# Patient Record
Sex: Male | Born: 1935 | Race: White | Hispanic: No | Marital: Married | Smoking: Former smoker
Health system: Southern US, Community
[De-identification: ages and names within clinical notes are randomized; demographics above are authoritative.]

## PROBLEM LIST (undated history)

## (undated) DIAGNOSIS — Z9889 Other specified postprocedural states: Secondary | ICD-10-CM

## (undated) DIAGNOSIS — N183 Chronic kidney disease, stage 3 unspecified: Secondary | ICD-10-CM

## (undated) DIAGNOSIS — I10 Essential (primary) hypertension: Secondary | ICD-10-CM

## (undated) DIAGNOSIS — K219 Gastro-esophageal reflux disease without esophagitis: Secondary | ICD-10-CM

## (undated) DIAGNOSIS — Z950 Presence of cardiac pacemaker: Secondary | ICD-10-CM

## (undated) DIAGNOSIS — I480 Paroxysmal atrial fibrillation: Secondary | ICD-10-CM

## (undated) DIAGNOSIS — Z87442 Personal history of urinary calculi: Secondary | ICD-10-CM

## (undated) DIAGNOSIS — R112 Nausea with vomiting, unspecified: Secondary | ICD-10-CM

## (undated) DIAGNOSIS — Z8719 Personal history of other diseases of the digestive system: Secondary | ICD-10-CM

## (undated) DIAGNOSIS — Z953 Presence of xenogenic heart valve: Secondary | ICD-10-CM

## (undated) DIAGNOSIS — E785 Hyperlipidemia, unspecified: Secondary | ICD-10-CM

## (undated) DIAGNOSIS — I2510 Atherosclerotic heart disease of native coronary artery without angina pectoris: Secondary | ICD-10-CM

## (undated) DIAGNOSIS — I471 Supraventricular tachycardia, unspecified: Secondary | ICD-10-CM

## (undated) DIAGNOSIS — D519 Vitamin B12 deficiency anemia, unspecified: Secondary | ICD-10-CM

## (undated) DIAGNOSIS — N400 Enlarged prostate without lower urinary tract symptoms: Secondary | ICD-10-CM

## (undated) DIAGNOSIS — G629 Polyneuropathy, unspecified: Secondary | ICD-10-CM

## (undated) DIAGNOSIS — C679 Malignant neoplasm of bladder, unspecified: Secondary | ICD-10-CM

## (undated) DIAGNOSIS — I340 Nonrheumatic mitral (valve) insufficiency: Secondary | ICD-10-CM

## (undated) HISTORY — PX: CATARACT EXTRACTION, BILATERAL: SHX1313

## (undated) HISTORY — DX: Essential (primary) hypertension: I10

## (undated) HISTORY — PX: AORTIC VALVE REPLACEMENT: SHX41

## (undated) HISTORY — DX: Vitamin B12 deficiency anemia, unspecified: D51.9

## (undated) HISTORY — PX: PREPATELLAR BURSA EXCISION: SUR547

## (undated) HISTORY — DX: Malignant neoplasm of bladder, unspecified: C67.9

## (undated) HISTORY — PX: OTHER SURGICAL HISTORY: SHX169

## (undated) HISTORY — DX: Gastro-esophageal reflux disease without esophagitis: K21.9

## (undated) HISTORY — PX: PACEMAKER INSERTION: SHX728

---
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
## 1974-12-16 HISTORY — PX: HAND SURGERY: SHX662

## 1989-12-16 HISTORY — PX: BACK SURGERY: SHX140

## 1993-12-16 HISTORY — PX: ROTATOR CUFF REPAIR: SHX139

## 2003-05-05 ENCOUNTER — Encounter: Payer: Self-pay | Admitting: Orthopedic Surgery

## 2003-05-10 ENCOUNTER — Inpatient Hospital Stay (HOSPITAL_COMMUNITY): Admission: RE | Admit: 2003-05-10 | Discharge: 2003-05-11 | Payer: Self-pay | Admitting: Orthopedic Surgery

## 2008-05-30 ENCOUNTER — Ambulatory Visit: Payer: Self-pay | Admitting: Urology

## 2008-12-16 HISTORY — PX: TURP VAPORIZATION: SUR1397

## 2009-12-06 ENCOUNTER — Ambulatory Visit: Payer: Self-pay | Admitting: Internal Medicine

## 2009-12-12 ENCOUNTER — Inpatient Hospital Stay: Payer: Self-pay | Admitting: Cardiovascular Disease

## 2011-04-18 ENCOUNTER — Ambulatory Visit: Payer: Self-pay | Admitting: Ophthalmology

## 2011-04-24 ENCOUNTER — Ambulatory Visit: Payer: Self-pay | Admitting: Ophthalmology

## 2011-09-25 ENCOUNTER — Ambulatory Visit: Payer: Self-pay | Admitting: Ophthalmology

## 2012-03-03 ENCOUNTER — Ambulatory Visit: Payer: Self-pay | Admitting: Family

## 2012-03-03 LAB — CBC WITH DIFFERENTIAL/PLATELET
Basophil %: 0.4 %
Eosinophil #: 0.5 10*3/uL (ref 0.0–0.7)
Eosinophil %: 7 %
HGB: 15.1 g/dL (ref 13.0–18.0)
Lymphocyte %: 24.2 %
MCHC: 33.8 g/dL (ref 32.0–36.0)
Monocyte %: 8.8 %
Neutrophil #: 3.9 10*3/uL (ref 1.4–6.5)
Neutrophil %: 59.6 %
Platelet: 187 10*3/uL (ref 150–440)
RBC: 4.84 10*6/uL (ref 4.40–5.90)

## 2012-03-03 LAB — BASIC METABOLIC PANEL
BUN: 23 mg/dL — ABNORMAL HIGH (ref 7–18)
Chloride: 104 mmol/L (ref 98–107)
Co2: 26 mmol/L (ref 21–32)
Creatinine: 1.34 mg/dL — ABNORMAL HIGH (ref 0.60–1.30)
EGFR (Non-African Amer.): 54 — ABNORMAL LOW
Glucose: 93 mg/dL (ref 65–99)
Osmolality: 287 (ref 275–301)

## 2012-03-03 LAB — TROPONIN I: Troponin-I: 0.02 ng/mL

## 2012-03-03 LAB — CK: CK, Total: 156 U/L (ref 35–232)

## 2012-03-03 LAB — PRO B NATRIURETIC PEPTIDE: B-Type Natriuretic Peptide: 475 pg/mL — ABNORMAL HIGH (ref 0–450)

## 2012-03-19 ENCOUNTER — Ambulatory Visit: Payer: Self-pay | Admitting: Cardiovascular Disease

## 2012-08-14 ENCOUNTER — Ambulatory Visit: Payer: Self-pay | Admitting: Cardiovascular Disease

## 2012-08-16 HISTORY — PX: CORONARY ARTERY BYPASS GRAFT: SHX141

## 2012-08-20 DIAGNOSIS — N400 Enlarged prostate without lower urinary tract symptoms: Secondary | ICD-10-CM | POA: Insufficient documentation

## 2012-08-20 DIAGNOSIS — E785 Hyperlipidemia, unspecified: Secondary | ICD-10-CM | POA: Insufficient documentation

## 2012-08-20 DIAGNOSIS — I471 Supraventricular tachycardia: Secondary | ICD-10-CM | POA: Insufficient documentation

## 2012-08-20 DIAGNOSIS — I10 Essential (primary) hypertension: Secondary | ICD-10-CM | POA: Insufficient documentation

## 2012-08-20 DIAGNOSIS — I2510 Atherosclerotic heart disease of native coronary artery without angina pectoris: Secondary | ICD-10-CM | POA: Insufficient documentation

## 2012-08-21 DIAGNOSIS — Z952 Presence of prosthetic heart valve: Secondary | ICD-10-CM | POA: Insufficient documentation

## 2012-08-21 DIAGNOSIS — K219 Gastro-esophageal reflux disease without esophagitis: Secondary | ICD-10-CM | POA: Insufficient documentation

## 2012-08-21 DIAGNOSIS — I350 Nonrheumatic aortic (valve) stenosis: Secondary | ICD-10-CM | POA: Insufficient documentation

## 2012-08-21 DIAGNOSIS — I340 Nonrheumatic mitral (valve) insufficiency: Secondary | ICD-10-CM | POA: Insufficient documentation

## 2012-08-27 DIAGNOSIS — I4891 Unspecified atrial fibrillation: Secondary | ICD-10-CM | POA: Insufficient documentation

## 2012-08-27 DIAGNOSIS — Z950 Presence of cardiac pacemaker: Secondary | ICD-10-CM | POA: Insufficient documentation

## 2012-08-30 DIAGNOSIS — I272 Pulmonary hypertension, unspecified: Secondary | ICD-10-CM | POA: Insufficient documentation

## 2012-09-08 DIAGNOSIS — I480 Paroxysmal atrial fibrillation: Secondary | ICD-10-CM | POA: Insufficient documentation

## 2012-09-12 ENCOUNTER — Other Ambulatory Visit: Payer: Self-pay | Admitting: Cardiothoracic Surgery

## 2012-09-12 LAB — BASIC METABOLIC PANEL
BUN: 37 mg/dL — ABNORMAL HIGH (ref 7–18)
Calcium, Total: 8.4 mg/dL — ABNORMAL LOW (ref 8.5–10.1)
Chloride: 95 mmol/L — ABNORMAL LOW (ref 98–107)
EGFR (African American): 28 — ABNORMAL LOW
EGFR (Non-African Amer.): 24 — ABNORMAL LOW
Glucose: 93 mg/dL (ref 65–99)
Potassium: 4.4 mmol/L (ref 3.5–5.1)
Sodium: 134 mmol/L — ABNORMAL LOW (ref 136–145)

## 2012-09-12 LAB — PROTIME-INR: Prothrombin Time: 18.6 secs — ABNORMAL HIGH (ref 11.5–14.7)

## 2012-11-17 ENCOUNTER — Ambulatory Visit: Payer: Self-pay | Admitting: Urology

## 2012-11-17 LAB — CBC WITH DIFFERENTIAL/PLATELET
Basophil %: 0.6 %
Eosinophil %: 1.4 %
HCT: 26.7 % — ABNORMAL LOW (ref 40.0–52.0)
HGB: 8.9 g/dL — ABNORMAL LOW (ref 13.0–18.0)
Lymphocyte %: 16.2 %
MCH: 29.6 pg (ref 26.0–34.0)
Monocyte #: 0.8 x10 3/mm (ref 0.2–1.0)
Monocyte %: 11.7 %
Neutrophil %: 70.1 %
Platelet: 277 10*3/uL (ref 150–440)
RBC: 3.02 10*6/uL — ABNORMAL LOW (ref 4.40–5.90)

## 2012-11-23 ENCOUNTER — Ambulatory Visit: Payer: Self-pay | Admitting: Urology

## 2012-12-01 ENCOUNTER — Ambulatory Visit: Payer: Self-pay | Admitting: Urology

## 2012-12-03 ENCOUNTER — Ambulatory Visit: Payer: Self-pay | Admitting: Urology

## 2013-01-06 ENCOUNTER — Ambulatory Visit: Payer: Self-pay | Admitting: Urology

## 2013-01-06 LAB — BASIC METABOLIC PANEL
Anion Gap: 5 — ABNORMAL LOW (ref 7–16)
BUN: 37 mg/dL — ABNORMAL HIGH (ref 7–18)
Co2: 25 mmol/L (ref 21–32)
Creatinine: 2.06 mg/dL — ABNORMAL HIGH (ref 0.60–1.30)
EGFR (African American): 33 — ABNORMAL LOW
EGFR (Non-African Amer.): 29 — ABNORMAL LOW
Glucose: 94 mg/dL (ref 65–99)
Osmolality: 286 (ref 275–301)
Sodium: 139 mmol/L (ref 136–145)

## 2013-01-06 LAB — CBC WITH DIFFERENTIAL/PLATELET
Eosinophil #: 0.1 10*3/uL (ref 0.0–0.7)
Eosinophil %: 1.6 %
Lymphocyte #: 0.9 10*3/uL — ABNORMAL LOW (ref 1.0–3.6)
Lymphocyte %: 15.5 %
Monocyte #: 0.8 x10 3/mm (ref 0.2–1.0)
Monocyte %: 12.4 %
Neutrophil #: 4.3 10*3/uL (ref 1.4–6.5)
Platelet: 248 10*3/uL (ref 150–440)
RBC: 2.73 10*6/uL — ABNORMAL LOW (ref 4.40–5.90)

## 2013-01-07 ENCOUNTER — Ambulatory Visit: Payer: Self-pay | Admitting: Urology

## 2013-01-11 ENCOUNTER — Ambulatory Visit: Payer: Self-pay | Admitting: Urology

## 2013-01-21 ENCOUNTER — Ambulatory Visit: Payer: Self-pay | Admitting: Internal Medicine

## 2013-01-26 ENCOUNTER — Ambulatory Visit: Payer: Self-pay | Admitting: Internal Medicine

## 2013-02-04 LAB — CBC CANCER CENTER
Basophil #: 0 x10 3/mm (ref 0.0–0.1)
Basophil %: 0.7 %
Eosinophil #: 0.1 x10 3/mm (ref 0.0–0.7)
Eosinophil %: 2 %
HGB: 10.2 g/dL — ABNORMAL LOW (ref 13.0–18.0)
MCHC: 31.9 g/dL — ABNORMAL LOW (ref 32.0–36.0)
MCV: 85 fL (ref 80–100)
Monocyte #: 0.6 x10 3/mm (ref 0.2–1.0)
Neutrophil %: 70 %
WBC: 6.5 x10 3/mm (ref 3.8–10.6)

## 2013-02-04 LAB — HEPATIC FUNCTION PANEL A (ARMC)
Albumin: 3.6 g/dL (ref 3.4–5.0)
Alkaline Phosphatase: 78 U/L (ref 50–136)
Bilirubin, Direct: 0.1 mg/dL (ref 0.00–0.20)
SGPT (ALT): 34 U/L (ref 12–78)
Total Protein: 7.6 g/dL (ref 6.4–8.2)

## 2013-02-04 LAB — CREATININE, SERUM
Creatinine: 1.9 mg/dL — ABNORMAL HIGH (ref 0.60–1.30)
EGFR (African American): 37 — ABNORMAL LOW

## 2013-02-04 LAB — IRON AND TIBC
Iron Bind.Cap.(Total): 403 ug/dL (ref 250–450)
Iron Saturation: 14 %
Iron: 58 ug/dL — ABNORMAL LOW (ref 65–175)

## 2013-02-04 LAB — RETICULOCYTES
Absolute Retic Count: 0.0476 10*6/uL (ref 0.031–0.129)
Reticulocyte: 1.27 % (ref 0.7–2.5)

## 2013-02-04 LAB — LACTATE DEHYDROGENASE: LDH: 278 U/L — ABNORMAL HIGH (ref 85–241)

## 2013-02-04 LAB — FERRITIN: Ferritin (ARMC): 29 ng/mL (ref 8–388)

## 2013-02-05 LAB — URINE IEP, RANDOM

## 2013-02-11 LAB — OCCULT BLOOD X 1 CARD TO LAB, STOOL: Occult Blood, Feces: POSITIVE

## 2013-02-13 ENCOUNTER — Ambulatory Visit: Payer: Self-pay | Admitting: Internal Medicine

## 2013-02-18 LAB — OCCULT BLOOD X 1 CARD TO LAB, STOOL
Occult Blood, Feces: NEGATIVE
Occult Blood, Feces: NEGATIVE

## 2013-02-18 LAB — PROTIME-INR: INR: 1.1

## 2013-02-18 LAB — PLATELET FUNCTION ASSAY: COL/ADP PLT FXN SCRN: 165 Seconds — ABNORMAL HIGH (ref 0–100)

## 2013-03-11 LAB — CANCER CENTER HEMOGLOBIN: HGB: 11.1 g/dL — ABNORMAL LOW (ref 13.0–18.0)

## 2013-03-16 ENCOUNTER — Ambulatory Visit: Payer: Self-pay | Admitting: Internal Medicine

## 2013-03-24 LAB — OCCULT BLOOD X 1 CARD TO LAB, STOOL
Occult Blood, Feces: NEGATIVE
Occult Blood, Feces: NEGATIVE
Occult Blood, Feces: NEGATIVE

## 2013-03-31 LAB — CREATININE, SERUM
Creatinine: 1.78 mg/dL — ABNORMAL HIGH (ref 0.60–1.30)
EGFR (African American): 40 — ABNORMAL LOW
EGFR (Non-African Amer.): 34 — ABNORMAL LOW

## 2013-03-31 LAB — CALCIUM: Calcium, Total: 8.9 mg/dL (ref 8.5–10.1)

## 2013-04-01 LAB — KAPPA/LAMBDA FREE LIGHT CHAINS (ARMC)

## 2013-04-01 LAB — PROT IMMUNOELECTROPHORES(ARMC)

## 2013-04-01 LAB — OCCULT BLOOD X 1 CARD TO LAB, STOOL: Occult Blood, Feces: POSITIVE

## 2013-04-09 ENCOUNTER — Ambulatory Visit: Payer: Self-pay | Admitting: Nephrology

## 2013-04-15 ENCOUNTER — Ambulatory Visit: Payer: Self-pay | Admitting: Internal Medicine

## 2013-04-27 ENCOUNTER — Ambulatory Visit: Payer: Self-pay | Admitting: Nephrology

## 2013-05-11 LAB — KAPPA/LAMBDA FREE LIGHT CHAINS (ARMC)

## 2013-05-11 LAB — PROT IMMUNOELECTROPHORES(ARMC)

## 2013-05-16 ENCOUNTER — Ambulatory Visit: Payer: Self-pay | Admitting: Internal Medicine

## 2013-06-04 LAB — CBC CANCER CENTER
Basophil %: 0.6 %
Eosinophil #: 0.3 x10 3/mm (ref 0.0–0.7)
Eosinophil %: 5.7 %
Lymphocyte %: 26.4 %
MCHC: 34.1 g/dL (ref 32.0–36.0)
MCV: 88 fL (ref 80–100)
Monocyte #: 0.8 x10 3/mm (ref 0.2–1.0)
Neutrophil %: 53.3 %
Platelet: 170 x10 3/mm (ref 150–440)
RBC: 4.1 10*6/uL — ABNORMAL LOW (ref 4.40–5.90)
RDW: 15.8 % — ABNORMAL HIGH (ref 11.5–14.5)
WBC: 5.5 x10 3/mm (ref 3.8–10.6)

## 2013-06-15 ENCOUNTER — Ambulatory Visit: Payer: Self-pay | Admitting: Internal Medicine

## 2013-07-02 LAB — CREATININE, SERUM: Creatinine: 1.81 mg/dL — ABNORMAL HIGH (ref 0.60–1.30)

## 2013-07-02 LAB — CBC CANCER CENTER
Basophil #: 0 x10 3/mm (ref 0.0–0.1)
Eosinophil #: 0.5 x10 3/mm (ref 0.0–0.7)
Eosinophil %: 7.9 %
HGB: 12.2 g/dL — ABNORMAL LOW (ref 13.0–18.0)
Lymphocyte #: 1.3 x10 3/mm (ref 1.0–3.6)
MCH: 30.1 pg (ref 26.0–34.0)
Monocyte #: 0.8 x10 3/mm (ref 0.2–1.0)
Neutrophil #: 3.2 x10 3/mm (ref 1.4–6.5)
Platelet: 159 x10 3/mm (ref 150–440)
RBC: 4.04 10*6/uL — ABNORMAL LOW (ref 4.40–5.90)
WBC: 5.8 x10 3/mm (ref 3.8–10.6)

## 2013-07-02 LAB — CALCIUM: Calcium, Total: 8.9 mg/dL (ref 8.5–10.1)

## 2013-07-06 LAB — KAPPA/LAMBDA FREE LIGHT CHAINS (ARMC)

## 2013-07-06 LAB — PROT IMMUNOELECTROPHORES(ARMC)

## 2013-07-16 ENCOUNTER — Ambulatory Visit: Payer: Self-pay | Admitting: Internal Medicine

## 2013-08-16 ENCOUNTER — Ambulatory Visit: Payer: Self-pay | Admitting: Internal Medicine

## 2013-08-27 LAB — CBC CANCER CENTER
Basophil #: 0 x10 3/mm (ref 0.0–0.1)
Eosinophil #: 0.4 x10 3/mm (ref 0.0–0.7)
HCT: 36.9 % — ABNORMAL LOW (ref 40.0–52.0)
Lymphocyte %: 20.5 %
MCH: 29.9 pg (ref 26.0–34.0)
MCHC: 33 g/dL (ref 32.0–36.0)
Monocyte #: 0.6 x10 3/mm (ref 0.2–1.0)
Monocyte %: 11.5 %
Neutrophil #: 3.1 x10 3/mm (ref 1.4–6.5)
Neutrophil %: 59.8 %
Platelet: 163 x10 3/mm (ref 150–440)
RBC: 4.08 10*6/uL — ABNORMAL LOW (ref 4.40–5.90)
RDW: 15.3 % — ABNORMAL HIGH (ref 11.5–14.5)
WBC: 5.1 x10 3/mm (ref 3.8–10.6)

## 2013-08-27 LAB — CREATININE, SERUM: EGFR (African American): 42 — ABNORMAL LOW

## 2013-09-15 ENCOUNTER — Ambulatory Visit: Payer: Self-pay | Admitting: Internal Medicine

## 2013-10-16 ENCOUNTER — Ambulatory Visit: Payer: Self-pay | Admitting: Internal Medicine

## 2013-10-22 LAB — CBC CANCER CENTER
Basophil #: 0 x10 3/mm (ref 0.0–0.1)
Basophil %: 0.8 %
Eosinophil #: 0.3 x10 3/mm (ref 0.0–0.7)
Eosinophil %: 5.2 %
HCT: 40.5 % (ref 40.0–52.0)
HGB: 13.2 g/dL (ref 13.0–18.0)
Lymphocyte #: 1.2 x10 3/mm (ref 1.0–3.6)
MCHC: 32.5 g/dL (ref 32.0–36.0)
MCV: 93 fL (ref 80–100)
Monocyte %: 12.3 %
Neutrophil #: 3.2 x10 3/mm (ref 1.4–6.5)
Neutrophil %: 59.8 %
Platelet: 173 x10 3/mm (ref 150–440)
RBC: 4.38 10*6/uL — ABNORMAL LOW (ref 4.40–5.90)
RDW: 15 % — ABNORMAL HIGH (ref 11.5–14.5)
WBC: 5.4 x10 3/mm (ref 3.8–10.6)

## 2013-10-22 LAB — CREATININE, SERUM: EGFR (Non-African Amer.): 34 — ABNORMAL LOW

## 2013-10-26 LAB — KAPPA/LAMBDA FREE LIGHT CHAINS (ARMC)

## 2013-11-15 ENCOUNTER — Ambulatory Visit: Payer: Self-pay | Admitting: Internal Medicine

## 2013-11-19 LAB — CREATININE, SERUM: EGFR (Non-African Amer.): 38 — ABNORMAL LOW

## 2013-11-22 LAB — PROT IMMUNOELECTROPHORES(ARMC)

## 2013-11-22 LAB — KAPPA/LAMBDA FREE LIGHT CHAINS (ARMC)

## 2013-12-15 ENCOUNTER — Emergency Department: Payer: Self-pay | Admitting: Emergency Medicine

## 2013-12-15 LAB — URINALYSIS, COMPLETE
Bilirubin,UR: NEGATIVE
Glucose,UR: NEGATIVE mg/dL (ref 0–75)
Hyaline Cast: 13
Ketone: NEGATIVE
Nitrite: NEGATIVE
Specific Gravity: 1.008 (ref 1.003–1.030)
Squamous Epithelial: NONE SEEN
WBC UR: 178 /HPF (ref 0–5)

## 2013-12-16 ENCOUNTER — Ambulatory Visit: Payer: Self-pay | Admitting: Internal Medicine

## 2013-12-16 ENCOUNTER — Ambulatory Visit: Payer: Self-pay | Admitting: Cardiovascular Disease

## 2013-12-18 LAB — BASIC METABOLIC PANEL
ANION GAP: 2 — AB (ref 7–16)
Anion Gap: 5 — ABNORMAL LOW (ref 7–16)
BUN: 37 mg/dL — ABNORMAL HIGH (ref 7–18)
BUN: 35 mg/dL — ABNORMAL HIGH (ref 7–18)
CHLORIDE: 103 mmol/L (ref 98–107)
CHLORIDE: 103 mmol/L (ref 98–107)
CO2: 27 mmol/L (ref 21–32)
CO2: 23 mmol/L (ref 21–32)
CREATININE: 2.44 mg/dL — AB (ref 0.60–1.30)
Calcium, Total: 8.6 mg/dL (ref 8.5–10.1)
Calcium, Total: 8.4 mg/dL — ABNORMAL LOW (ref 8.5–10.1)
Creatinine: 2.27 mg/dL — ABNORMAL HIGH (ref 0.60–1.30)
EGFR (Non-African Amer.): 25 — ABNORMAL LOW
GFR CALC AF AMER: 27 — AB
GFR CALC AF AMER: 29 — AB
GFR CALC NON AF AMER: 23 — AB
Glucose: 108 mg/dL — ABNORMAL HIGH (ref 65–99)
Glucose: 114 mg/dL — ABNORMAL HIGH (ref 65–99)
OSMOLALITY: 274 (ref 275–301)
OSMOLALITY: 271 (ref 275–301)
POTASSIUM: 5.4 mmol/L — AB (ref 3.5–5.1)
Potassium: 5.2 mmol/L — ABNORMAL HIGH (ref 3.5–5.1)
Sodium: 132 mmol/L — ABNORMAL LOW (ref 136–145)
Sodium: 131 mmol/L — ABNORMAL LOW (ref 136–145)

## 2013-12-18 LAB — CBC
HCT: 37.8 % — AB (ref 40.0–52.0)
HCT: 34.2 % — ABNORMAL LOW (ref 40.0–52.0)
HGB: 12.7 g/dL — ABNORMAL LOW (ref 13.0–18.0)
HGB: 11.7 g/dL — ABNORMAL LOW (ref 13.0–18.0)
MCH: 30.7 pg (ref 26.0–34.0)
MCH: 30.5 pg (ref 26.0–34.0)
MCHC: 33.5 g/dL (ref 32.0–36.0)
MCHC: 34.1 g/dL (ref 32.0–36.0)
MCV: 92 fL (ref 80–100)
MCV: 89 fL (ref 80–100)
PLATELETS: 166 10*3/uL (ref 150–440)
Platelet: 155 10*3/uL (ref 150–440)
RBC: 4.12 10*6/uL — ABNORMAL LOW (ref 4.40–5.90)
RBC: 3.83 10*6/uL — ABNORMAL LOW (ref 4.40–5.90)
RDW: 14.2 % (ref 11.5–14.5)
RDW: 14.2 % (ref 11.5–14.5)
WBC: 10.7 10*3/uL — ABNORMAL HIGH (ref 3.8–10.6)
WBC: 8.9 10*3/uL (ref 3.8–10.6)

## 2013-12-18 LAB — URINALYSIS, COMPLETE
BACTERIA: NONE SEEN
RBC,UR: 23970 /HPF (ref 0–5)
Specific Gravity: 1.016 (ref 1.003–1.030)
Squamous Epithelial: NONE SEEN
WBC UR: 41 /HPF (ref 0–5)

## 2013-12-18 LAB — TROPONIN I: Troponin-I: <0.02 ng/mL

## 2013-12-18 LAB — PRO B NATRIURETIC PEPTIDE: B-Type Natriuretic Peptide: 1066 pg/mL — ABNORMAL HIGH (ref 0–450)

## 2013-12-19 ENCOUNTER — Observation Stay: Payer: Self-pay | Admitting: Internal Medicine

## 2013-12-19 DIAGNOSIS — R079 Chest pain, unspecified: Secondary | ICD-10-CM

## 2013-12-19 LAB — BASIC METABOLIC PANEL
Anion Gap: 4 — ABNORMAL LOW (ref 7–16)
BUN: 35 mg/dL — ABNORMAL HIGH (ref 7–18)
CALCIUM: 8.6 mg/dL (ref 8.5–10.1)
CHLORIDE: 106 mmol/L (ref 98–107)
CREATININE: 2.2 mg/dL — AB (ref 0.60–1.30)
Co2: 25 mmol/L (ref 21–32)
EGFR (African American): 31 — ABNORMAL LOW
GFR CALC NON AF AMER: 26 — AB
Glucose: 105 mg/dL — ABNORMAL HIGH (ref 65–99)
Osmolality: 278 (ref 275–301)
Potassium: 5.1 mmol/L (ref 3.5–5.1)
Sodium: 135 mmol/L — ABNORMAL LOW (ref 136–145)

## 2013-12-19 LAB — CBC WITH DIFFERENTIAL/PLATELET
Basophil #: 0 10*3/uL (ref 0.0–0.1)
Basophil %: 0.7 %
EOS PCT: 4 %
Eosinophil #: 0.3 10*3/uL (ref 0.0–0.7)
HCT: 32.8 % — AB (ref 40.0–52.0)
HGB: 11.1 g/dL — ABNORMAL LOW (ref 13.0–18.0)
Lymphocyte #: 1.2 10*3/uL (ref 1.0–3.6)
Lymphocyte %: 18.1 %
MCH: 30.4 pg (ref 26.0–34.0)
MCHC: 34 g/dL (ref 32.0–36.0)
MCV: 90 fL (ref 80–100)
MONOS PCT: 10.5 %
Monocyte #: 0.7 x10 3/mm (ref 0.2–1.0)
NEUTROS ABS: 4.2 10*3/uL (ref 1.4–6.5)
Neutrophil %: 66.7 %
Platelet: 140 10*3/uL — ABNORMAL LOW (ref 150–440)
RBC: 3.66 10*6/uL — ABNORMAL LOW (ref 4.40–5.90)
RDW: 14.2 % (ref 11.5–14.5)
WBC: 6.4 10*3/uL (ref 3.8–10.6)

## 2013-12-19 LAB — LIPID PANEL
CHOLESTEROL: 136 mg/dL (ref 0–200)
HDL Cholesterol: 38 mg/dL — ABNORMAL LOW (ref 40–60)
LDL CHOLESTEROL, CALC: 77 mg/dL (ref 0–100)
Triglycerides: 107 mg/dL (ref 0–200)
VLDL Cholesterol, Calc: 21 mg/dL (ref 5–40)

## 2013-12-19 LAB — CK TOTAL AND CKMB (NOT AT ARMC)
CK, TOTAL: 110 U/L (ref 35–232)
CK, TOTAL: 123 U/L (ref 35–232)
CK, Total: 114 U/L (ref 35–232)
CK-MB: 2.3 ng/mL (ref 0.5–3.6)
CK-MB: 2.4 ng/mL (ref 0.5–3.6)
CK-MB: 2.5 ng/mL (ref 0.5–3.6)

## 2013-12-19 LAB — TROPONIN I

## 2013-12-31 LAB — CREATININE, SERUM
Creatinine: 1.69 mg/dL — ABNORMAL HIGH (ref 0.60–1.30)
EGFR (African American): 42 — ABNORMAL LOW
EGFR (Non-African Amer.): 36 — ABNORMAL LOW

## 2013-12-31 LAB — CBC CANCER CENTER
Basophil #: 0 x10 3/mm (ref 0.0–0.1)
Basophil %: 0.8 %
EOS ABS: 0.2 x10 3/mm (ref 0.0–0.7)
Eosinophil %: 4.2 %
HCT: 38.6 % — ABNORMAL LOW (ref 40.0–52.0)
HGB: 12.6 g/dL — ABNORMAL LOW (ref 13.0–18.0)
Lymphocyte #: 1 x10 3/mm (ref 1.0–3.6)
Lymphocyte %: 20.1 %
MCH: 29.9 pg (ref 26.0–34.0)
MCHC: 32.7 g/dL (ref 32.0–36.0)
MCV: 92 fL (ref 80–100)
MONO ABS: 0.6 x10 3/mm (ref 0.2–1.0)
MONOS PCT: 11.6 %
NEUTROS ABS: 3.2 x10 3/mm (ref 1.4–6.5)
NEUTROS PCT: 63.3 %
Platelet: 199 x10 3/mm (ref 150–440)
RBC: 4.21 10*6/uL — ABNORMAL LOW (ref 4.40–5.90)
RDW: 14.1 % (ref 11.5–14.5)
WBC: 5.1 x10 3/mm (ref 3.8–10.6)

## 2013-12-31 LAB — CALCIUM: Calcium, Total: 8.9 mg/dL (ref 8.5–10.1)

## 2014-01-03 LAB — PROT IMMUNOELECTROPHORES(ARMC)

## 2014-01-03 LAB — KAPPA/LAMBDA FREE LIGHT CHAINS (ARMC)

## 2014-01-16 ENCOUNTER — Ambulatory Visit: Payer: Self-pay | Admitting: Internal Medicine

## 2014-02-08 ENCOUNTER — Ambulatory Visit: Payer: Self-pay | Admitting: Urology

## 2014-02-08 LAB — CBC WITH DIFFERENTIAL/PLATELET
BASOS ABS: 0 10*3/uL (ref 0.0–0.1)
Basophil %: 0.2 %
EOS ABS: 0.2 10*3/uL (ref 0.0–0.7)
Eosinophil %: 2.7 %
HCT: 41 % (ref 40.0–52.0)
HGB: 13.4 g/dL (ref 13.0–18.0)
Lymphocyte #: 1.2 10*3/uL (ref 1.0–3.6)
Lymphocyte %: 16 %
MCH: 29.9 pg (ref 26.0–34.0)
MCHC: 32.6 g/dL (ref 32.0–36.0)
MCV: 92 fL (ref 80–100)
MONO ABS: 0.8 x10 3/mm (ref 0.2–1.0)
MONOS PCT: 10.5 %
NEUTROS PCT: 70.6 %
Neutrophil #: 5.4 10*3/uL (ref 1.4–6.5)
Platelet: 154 10*3/uL (ref 150–440)
RBC: 4.47 10*6/uL (ref 4.40–5.90)
RDW: 14.7 % — AB (ref 11.5–14.5)
WBC: 7.6 10*3/uL (ref 3.8–10.6)

## 2014-02-14 ENCOUNTER — Ambulatory Visit: Payer: Self-pay | Admitting: Urology

## 2014-02-17 LAB — PATHOLOGY REPORT

## 2014-02-22 ENCOUNTER — Ambulatory Visit: Payer: Self-pay | Admitting: Internal Medicine

## 2014-03-02 LAB — CBC CANCER CENTER
Basophil #: 0 x10 3/mm (ref 0.0–0.1)
Basophil %: 0.7 %
Eosinophil #: 0.1 x10 3/mm (ref 0.0–0.7)
Eosinophil %: 2.5 %
HCT: 35.9 % — ABNORMAL LOW (ref 40.0–52.0)
HGB: 11.6 g/dL — ABNORMAL LOW (ref 13.0–18.0)
LYMPHS ABS: 1 x10 3/mm (ref 1.0–3.6)
Lymphocyte %: 18 %
MCH: 29.7 pg (ref 26.0–34.0)
MCHC: 32.4 g/dL (ref 32.0–36.0)
MCV: 92 fL (ref 80–100)
MONO ABS: 0.7 x10 3/mm (ref 0.2–1.0)
Monocyte %: 12 %
NEUTROS ABS: 3.9 x10 3/mm (ref 1.4–6.5)
Neutrophil %: 66.8 %
Platelet: 189 x10 3/mm (ref 150–440)
RBC: 3.92 10*6/uL — ABNORMAL LOW (ref 4.40–5.90)
RDW: 14.7 % — ABNORMAL HIGH (ref 11.5–14.5)
WBC: 5.8 x10 3/mm (ref 3.8–10.6)

## 2014-03-02 LAB — CREATININE, SERUM
Creatinine: 1.46 mg/dL — ABNORMAL HIGH (ref 0.60–1.30)
EGFR (African American): 50 — ABNORMAL LOW
GFR CALC NON AF AMER: 43 — AB

## 2014-03-02 LAB — CALCIUM: CALCIUM: 8.5 mg/dL (ref 8.5–10.1)

## 2014-03-04 LAB — KAPPA/LAMBDA FREE LIGHT CHAINS (ARMC)

## 2014-03-04 LAB — PROT IMMUNOELECTROPHORES(ARMC)

## 2014-03-16 ENCOUNTER — Ambulatory Visit: Payer: Self-pay | Admitting: Internal Medicine

## 2014-03-28 LAB — CBC CANCER CENTER
BASOS ABS: 0 x10 3/mm (ref 0.0–0.1)
BASOS PCT: 0.4 %
EOS PCT: 2.9 %
Eosinophil #: 0.1 x10 3/mm (ref 0.0–0.7)
HCT: 36.7 % — ABNORMAL LOW (ref 40.0–52.0)
HGB: 12 g/dL — ABNORMAL LOW (ref 13.0–18.0)
Lymphocyte #: 0.8 x10 3/mm — ABNORMAL LOW (ref 1.0–3.6)
Lymphocyte %: 19.6 %
MCH: 30.5 pg (ref 26.0–34.0)
MCHC: 32.7 g/dL (ref 32.0–36.0)
MCV: 94 fL (ref 80–100)
Monocyte #: 0.4 x10 3/mm (ref 0.2–1.0)
Monocyte %: 10.6 %
Neutrophil #: 2.6 x10 3/mm (ref 1.4–6.5)
Neutrophil %: 66.5 %
Platelet: 193 x10 3/mm (ref 150–440)
RBC: 3.92 10*6/uL — ABNORMAL LOW (ref 4.40–5.90)
RDW: 15.4 % — AB (ref 11.5–14.5)
WBC: 3.9 x10 3/mm (ref 3.8–10.6)

## 2014-04-04 LAB — CBC CANCER CENTER
Basophil #: 0 x10 3/mm (ref 0.0–0.1)
Basophil %: 0.2 %
EOS PCT: 2.1 %
Eosinophil #: 0.1 x10 3/mm (ref 0.0–0.7)
HCT: 36.8 % — ABNORMAL LOW (ref 40.0–52.0)
HGB: 11.9 g/dL — AB (ref 13.0–18.0)
Lymphocyte #: 0.5 x10 3/mm — ABNORMAL LOW (ref 1.0–3.6)
Lymphocyte %: 15.2 %
MCH: 30.4 pg (ref 26.0–34.0)
MCHC: 32.5 g/dL (ref 32.0–36.0)
MCV: 94 fL (ref 80–100)
Monocyte #: 0.5 x10 3/mm (ref 0.2–1.0)
Monocyte %: 14.1 %
NEUTROS PCT: 68.4 %
Neutrophil #: 2.4 x10 3/mm (ref 1.4–6.5)
Platelet: 144 x10 3/mm — ABNORMAL LOW (ref 150–440)
RBC: 3.93 10*6/uL — ABNORMAL LOW (ref 4.40–5.90)
RDW: 15.5 % — AB (ref 11.5–14.5)
WBC: 3.5 x10 3/mm — ABNORMAL LOW (ref 3.8–10.6)

## 2014-04-11 LAB — CBC CANCER CENTER
BASOS PCT: 0.2 %
Basophil #: 0 x10 3/mm (ref 0.0–0.1)
EOS ABS: 0.2 x10 3/mm (ref 0.0–0.7)
EOS PCT: 3.2 %
HCT: 35.8 % — ABNORMAL LOW (ref 40.0–52.0)
HGB: 11.7 g/dL — ABNORMAL LOW (ref 13.0–18.0)
LYMPHS PCT: 11.6 %
Lymphocyte #: 0.5 x10 3/mm — ABNORMAL LOW (ref 1.0–3.6)
MCH: 30.5 pg (ref 26.0–34.0)
MCHC: 32.8 g/dL (ref 32.0–36.0)
MCV: 93 fL (ref 80–100)
MONO ABS: 0.6 x10 3/mm (ref 0.2–1.0)
Monocyte %: 13.6 %
NEUTROS ABS: 3.4 x10 3/mm (ref 1.4–6.5)
Neutrophil %: 71.4 %
Platelet: 201 x10 3/mm (ref 150–440)
RBC: 3.84 10*6/uL — ABNORMAL LOW (ref 4.40–5.90)
RDW: 15.5 % — AB (ref 11.5–14.5)
WBC: 4.8 x10 3/mm (ref 3.8–10.6)

## 2014-04-15 ENCOUNTER — Ambulatory Visit: Payer: Self-pay | Admitting: Internal Medicine

## 2014-04-19 LAB — CBC CANCER CENTER
BASOS ABS: 0 x10 3/mm (ref 0.0–0.1)
Basophil %: 0.7 %
EOS PCT: 5.6 %
Eosinophil #: 0.3 x10 3/mm (ref 0.0–0.7)
HCT: 34.9 % — AB (ref 40.0–52.0)
HGB: 11.6 g/dL — ABNORMAL LOW (ref 13.0–18.0)
LYMPHS ABS: 0.5 x10 3/mm — AB (ref 1.0–3.6)
Lymphocyte %: 7.8 %
MCH: 31.1 pg (ref 26.0–34.0)
MCHC: 33.3 g/dL (ref 32.0–36.0)
MCV: 93 fL (ref 80–100)
MONO ABS: 0.7 x10 3/mm (ref 0.2–1.0)
Monocyte %: 11.7 %
Neutrophil #: 4.6 x10 3/mm (ref 1.4–6.5)
Neutrophil %: 74.2 %
Platelet: 224 x10 3/mm (ref 150–440)
RBC: 3.74 10*6/uL — AB (ref 4.40–5.90)
RDW: 15.7 % — ABNORMAL HIGH (ref 11.5–14.5)
WBC: 6.2 x10 3/mm (ref 3.8–10.6)

## 2014-04-25 LAB — CBC CANCER CENTER
BASOS ABS: 0 x10 3/mm (ref 0.0–0.1)
Basophil %: 0.5 %
Eosinophil #: 0.3 x10 3/mm (ref 0.0–0.7)
Eosinophil %: 5.1 %
HCT: 33.7 % — AB (ref 40.0–52.0)
HGB: 11.3 g/dL — ABNORMAL LOW (ref 13.0–18.0)
LYMPHS PCT: 7.1 %
Lymphocyte #: 0.4 x10 3/mm — ABNORMAL LOW (ref 1.0–3.6)
MCH: 31.4 pg (ref 26.0–34.0)
MCHC: 33.4 g/dL (ref 32.0–36.0)
MCV: 94 fL (ref 80–100)
MONO ABS: 0.7 x10 3/mm (ref 0.2–1.0)
Monocyte %: 12 %
Neutrophil #: 4.3 x10 3/mm (ref 1.4–6.5)
Neutrophil %: 75.3 %
PLATELETS: 180 x10 3/mm (ref 150–440)
RBC: 3.59 10*6/uL — AB (ref 4.40–5.90)
RDW: 15.8 % — AB (ref 11.5–14.5)
WBC: 5.8 x10 3/mm (ref 3.8–10.6)

## 2014-05-16 ENCOUNTER — Ambulatory Visit: Payer: Self-pay | Admitting: Internal Medicine

## 2014-05-27 LAB — CBC CANCER CENTER
Basophil #: 0 x10 3/mm (ref 0.0–0.1)
Basophil %: 0.7 %
Eosinophil #: 0.2 x10 3/mm (ref 0.0–0.7)
Eosinophil %: 6 %
HCT: 35.4 % — ABNORMAL LOW (ref 40.0–52.0)
HGB: 11.8 g/dL — ABNORMAL LOW (ref 13.0–18.0)
Lymphocyte #: 0.8 x10 3/mm — ABNORMAL LOW (ref 1.0–3.6)
Lymphocyte %: 21.9 %
MCH: 31.6 pg (ref 26.0–34.0)
MCHC: 33.2 g/dL (ref 32.0–36.0)
MCV: 95 fL (ref 80–100)
Monocyte #: 0.6 x10 3/mm (ref 0.2–1.0)
Monocyte %: 16.7 %
Neutrophil #: 2 x10 3/mm (ref 1.4–6.5)
Neutrophil %: 54.7 %
PLATELETS: 152 x10 3/mm (ref 150–440)
RBC: 3.72 10*6/uL — ABNORMAL LOW (ref 4.40–5.90)
RDW: 15.7 % — ABNORMAL HIGH (ref 11.5–14.5)
WBC: 3.6 x10 3/mm — ABNORMAL LOW (ref 3.8–10.6)

## 2014-06-01 LAB — KAPPA/LAMBDA FREE LIGHT CHAINS (ARMC)

## 2014-06-01 LAB — PROT IMMUNOELECTROPHORES(ARMC)

## 2014-06-15 ENCOUNTER — Ambulatory Visit: Payer: Self-pay | Admitting: Internal Medicine

## 2014-07-05 ENCOUNTER — Ambulatory Visit: Payer: Self-pay | Admitting: Orthopedic Surgery

## 2014-07-05 DIAGNOSIS — I25810 Atherosclerosis of coronary artery bypass graft(s) without angina pectoris: Secondary | ICD-10-CM

## 2014-07-05 DIAGNOSIS — Z01810 Encounter for preprocedural cardiovascular examination: Secondary | ICD-10-CM

## 2014-07-05 LAB — CBC
HCT: 37.9 % — AB (ref 40.0–52.0)
HGB: 12.6 g/dL — ABNORMAL LOW (ref 13.0–18.0)
MCH: 32.1 pg (ref 26.0–34.0)
MCHC: 33.3 g/dL (ref 32.0–36.0)
MCV: 96 fL (ref 80–100)
PLATELETS: 165 10*3/uL (ref 150–440)
RBC: 3.93 10*6/uL — ABNORMAL LOW (ref 4.40–5.90)
RDW: 14.1 % (ref 11.5–14.5)
WBC: 4.4 10*3/uL (ref 3.8–10.6)

## 2014-07-05 LAB — BASIC METABOLIC PANEL
Anion Gap: 6 — ABNORMAL LOW (ref 7–16)
BUN: 23 mg/dL — AB (ref 7–18)
CALCIUM: 8.9 mg/dL (ref 8.5–10.1)
CO2: 25 mmol/L (ref 21–32)
CREATININE: 1.59 mg/dL — AB (ref 0.60–1.30)
Chloride: 108 mmol/L — ABNORMAL HIGH (ref 98–107)
EGFR (African American): 45 — ABNORMAL LOW
EGFR (Non-African Amer.): 39 — ABNORMAL LOW
Glucose: 84 mg/dL (ref 65–99)
Osmolality: 280 (ref 275–301)
Potassium: 5.6 mmol/L — ABNORMAL HIGH (ref 3.5–5.1)
Sodium: 139 mmol/L (ref 136–145)

## 2014-07-12 ENCOUNTER — Ambulatory Visit: Payer: Self-pay | Admitting: Orthopedic Surgery

## 2014-07-14 LAB — PATHOLOGY REPORT

## 2014-08-01 ENCOUNTER — Ambulatory Visit: Payer: Self-pay | Admitting: Internal Medicine

## 2014-08-01 LAB — CBC CANCER CENTER
BASOS PCT: 0.6 %
Basophil #: 0 x10 3/mm (ref 0.0–0.1)
Eosinophil #: 0.3 x10 3/mm (ref 0.0–0.7)
Eosinophil %: 6.7 %
HCT: 38 % — AB (ref 40.0–52.0)
HGB: 12.2 g/dL — ABNORMAL LOW (ref 13.0–18.0)
Lymphocyte #: 0.9 x10 3/mm — ABNORMAL LOW (ref 1.0–3.6)
Lymphocyte %: 17.6 %
MCH: 30.6 pg (ref 26.0–34.0)
MCHC: 32 g/dL (ref 32.0–36.0)
MCV: 96 fL (ref 80–100)
Monocyte #: 0.6 x10 3/mm (ref 0.2–1.0)
Monocyte %: 12.5 %
Neutrophil #: 3.1 x10 3/mm (ref 1.4–6.5)
Neutrophil %: 62.6 %
Platelet: 161 x10 3/mm (ref 150–440)
RBC: 3.98 10*6/uL — ABNORMAL LOW (ref 4.40–5.90)
RDW: 14 % (ref 11.5–14.5)
WBC: 5 x10 3/mm (ref 3.8–10.6)

## 2014-08-01 LAB — COMPREHENSIVE METABOLIC PANEL
Albumin: 3.7 g/dL (ref 3.4–5.0)
Alkaline Phosphatase: 59 U/L
Anion Gap: 9 (ref 7–16)
BUN: 25 mg/dL — AB (ref 7–18)
Bilirubin,Total: 0.4 mg/dL (ref 0.2–1.0)
CHLORIDE: 106 mmol/L (ref 98–107)
CREATININE: 1.91 mg/dL — AB (ref 0.60–1.30)
Calcium, Total: 8.5 mg/dL (ref 8.5–10.1)
Co2: 28 mmol/L (ref 21–32)
EGFR (African American): 36 — ABNORMAL LOW
EGFR (Non-African Amer.): 31 — ABNORMAL LOW
Glucose: 88 mg/dL (ref 65–99)
Osmolality: 289 (ref 275–301)
POTASSIUM: 4.8 mmol/L (ref 3.5–5.1)
SGOT(AST): 15 U/L (ref 15–37)
SGPT (ALT): 18 U/L
Sodium: 143 mmol/L (ref 136–145)
TOTAL PROTEIN: 7.4 g/dL (ref 6.4–8.2)

## 2014-08-08 LAB — BASIC METABOLIC PANEL
Anion Gap: 9 (ref 7–16)
BUN: 21 mg/dL — ABNORMAL HIGH (ref 7–18)
Calcium, Total: 8.4 mg/dL — ABNORMAL LOW (ref 8.5–10.1)
Chloride: 106 mmol/L (ref 98–107)
Co2: 27 mmol/L (ref 21–32)
Creatinine: 1.68 mg/dL — ABNORMAL HIGH (ref 0.60–1.30)
EGFR (African American): 42 — ABNORMAL LOW
EGFR (Non-African Amer.): 36 — ABNORMAL LOW
Glucose: 97 mg/dL (ref 65–99)
Osmolality: 286 (ref 275–301)
Potassium: 5 mmol/L (ref 3.5–5.1)
Sodium: 142 mmol/L (ref 136–145)

## 2014-08-10 LAB — KAPPA/LAMBDA FREE LIGHT CHAINS (ARMC)

## 2014-08-10 LAB — PROT IMMUNOELECTROPHORES(ARMC)

## 2014-08-16 ENCOUNTER — Ambulatory Visit: Payer: Self-pay | Admitting: Internal Medicine

## 2014-08-31 ENCOUNTER — Ambulatory Visit: Payer: Self-pay | Admitting: Urology

## 2014-08-31 LAB — BASIC METABOLIC PANEL
Anion Gap: 5 — ABNORMAL LOW (ref 7–16)
BUN: 27 mg/dL — ABNORMAL HIGH (ref 7–18)
CALCIUM: 8.8 mg/dL (ref 8.5–10.1)
CHLORIDE: 107 mmol/L (ref 98–107)
Co2: 26 mmol/L (ref 21–32)
Creatinine: 1.62 mg/dL — ABNORMAL HIGH (ref 0.60–1.30)
EGFR (Non-African Amer.): 38 — ABNORMAL LOW
GFR CALC AF AMER: 44 — AB
Glucose: 89 mg/dL (ref 65–99)
Osmolality: 280 (ref 275–301)
Potassium: 5.2 mmol/L — ABNORMAL HIGH (ref 3.5–5.1)
SODIUM: 138 mmol/L (ref 136–145)

## 2014-08-31 LAB — CBC WITH DIFFERENTIAL/PLATELET
BASOS ABS: 0 10*3/uL (ref 0.0–0.1)
Basophil %: 0.9 %
Eosinophil #: 0.3 10*3/uL (ref 0.0–0.7)
Eosinophil %: 6.4 %
HCT: 38.6 % — ABNORMAL LOW (ref 40.0–52.0)
HGB: 12.7 g/dL — ABNORMAL LOW (ref 13.0–18.0)
Lymphocyte #: 0.9 10*3/uL — ABNORMAL LOW (ref 1.0–3.6)
Lymphocyte %: 19.6 %
MCH: 31.1 pg (ref 26.0–34.0)
MCHC: 33 g/dL (ref 32.0–36.0)
MCV: 94 fL (ref 80–100)
MONO ABS: 0.6 x10 3/mm (ref 0.2–1.0)
Monocyte %: 13.6 %
Neutrophil #: 2.8 10*3/uL (ref 1.4–6.5)
Neutrophil %: 59.5 %
PLATELETS: 159 10*3/uL (ref 150–440)
RBC: 4.1 10*6/uL — ABNORMAL LOW (ref 4.40–5.90)
RDW: 13.8 % (ref 11.5–14.5)
WBC: 4.6 10*3/uL (ref 3.8–10.6)

## 2014-09-06 ENCOUNTER — Ambulatory Visit: Payer: Self-pay | Admitting: Urology

## 2014-09-08 LAB — PATHOLOGY REPORT

## 2014-09-14 LAB — CBC CANCER CENTER
BASOS PCT: 0.5 %
Basophil #: 0 x10 3/mm (ref 0.0–0.1)
EOS ABS: 0.4 x10 3/mm (ref 0.0–0.7)
Eosinophil %: 7.9 %
HCT: 36.6 % — ABNORMAL LOW (ref 40.0–52.0)
HGB: 11.6 g/dL — AB (ref 13.0–18.0)
LYMPHS ABS: 0.7 x10 3/mm — AB (ref 1.0–3.6)
Lymphocyte %: 13 %
MCH: 30.2 pg (ref 26.0–34.0)
MCHC: 31.9 g/dL — ABNORMAL LOW (ref 32.0–36.0)
MCV: 95 fL (ref 80–100)
MONO ABS: 0.5 x10 3/mm (ref 0.2–1.0)
MONOS PCT: 9.3 %
NEUTROS ABS: 3.9 x10 3/mm (ref 1.4–6.5)
Neutrophil %: 69.3 %
PLATELETS: 154 x10 3/mm (ref 150–440)
RBC: 3.86 10*6/uL — AB (ref 4.40–5.90)
RDW: 14.1 % (ref 11.5–14.5)
WBC: 5.6 x10 3/mm (ref 3.8–10.6)

## 2014-09-14 LAB — URIC ACID: URIC ACID: 6 mg/dL (ref 3.5–7.2)

## 2014-09-14 LAB — BASIC METABOLIC PANEL
Anion Gap: 9 (ref 7–16)
BUN: 30 mg/dL — ABNORMAL HIGH (ref 7–18)
CALCIUM: 8.9 mg/dL (ref 8.5–10.1)
CO2: 25 mmol/L (ref 21–32)
Chloride: 102 mmol/L (ref 98–107)
Creatinine: 1.86 mg/dL — ABNORMAL HIGH (ref 0.60–1.30)
GFR CALC AF AMER: 45 — AB
GFR CALC NON AF AMER: 37 — AB
Glucose: 96 mg/dL (ref 65–99)
OSMOLALITY: 278 (ref 275–301)
Potassium: 5 mmol/L (ref 3.5–5.1)
Sodium: 136 mmol/L (ref 136–145)

## 2014-09-15 ENCOUNTER — Ambulatory Visit: Payer: Self-pay | Admitting: Internal Medicine

## 2014-09-19 LAB — KAPPA/LAMBDA FREE LIGHT CHAINS (ARMC)

## 2014-09-19 LAB — PROT IMMUNOELECTROPHORES(ARMC)

## 2014-10-14 LAB — CBC CANCER CENTER
BASOS PCT: 0.5 %
Basophil #: 0 x10 3/mm (ref 0.0–0.1)
EOS ABS: 0.2 x10 3/mm (ref 0.0–0.7)
EOS PCT: 4.8 %
HCT: 36.3 % — AB (ref 40.0–52.0)
HGB: 11.9 g/dL — ABNORMAL LOW (ref 13.0–18.0)
Lymphocyte #: 0.7 x10 3/mm — ABNORMAL LOW (ref 1.0–3.6)
Lymphocyte %: 15.3 %
MCH: 31.1 pg (ref 26.0–34.0)
MCHC: 32.8 g/dL (ref 32.0–36.0)
MCV: 95 fL (ref 80–100)
Monocyte #: 0.5 x10 3/mm (ref 0.2–1.0)
Monocyte %: 10.1 %
NEUTROS PCT: 69.3 %
Neutrophil #: 3.1 x10 3/mm (ref 1.4–6.5)
PLATELETS: 152 x10 3/mm (ref 150–440)
RBC: 3.83 10*6/uL — AB (ref 4.40–5.90)
RDW: 15.1 % — ABNORMAL HIGH (ref 11.5–14.5)
WBC: 4.5 x10 3/mm (ref 3.8–10.6)

## 2014-10-14 LAB — CREATININE, SERUM
CREATININE: 1.56 mg/dL — AB (ref 0.60–1.30)
EGFR (Non-African Amer.): 45 — ABNORMAL LOW
GFR CALC AF AMER: 55 — AB

## 2014-10-16 ENCOUNTER — Ambulatory Visit: Payer: Self-pay | Admitting: Internal Medicine

## 2014-10-17 ENCOUNTER — Encounter: Payer: Self-pay | Admitting: Surgery

## 2014-11-14 LAB — CBC CANCER CENTER
Basophil #: 0 x10 3/mm (ref 0.0–0.1)
Basophil %: 0.8 %
EOS PCT: 4.6 %
Eosinophil #: 0.2 x10 3/mm (ref 0.0–0.7)
HCT: 36.3 % — AB (ref 40.0–52.0)
HGB: 12 g/dL — AB (ref 13.0–18.0)
Lymphocyte #: 0.9 x10 3/mm — ABNORMAL LOW (ref 1.0–3.6)
Lymphocyte %: 17.3 %
MCH: 31.8 pg (ref 26.0–34.0)
MCHC: 32.9 g/dL (ref 32.0–36.0)
MCV: 97 fL (ref 80–100)
Monocyte #: 0.8 x10 3/mm (ref 0.2–1.0)
Monocyte %: 15.4 %
NEUTROS ABS: 3.1 x10 3/mm (ref 1.4–6.5)
NEUTROS PCT: 61.9 %
Platelet: 202 x10 3/mm (ref 150–440)
RBC: 3.76 10*6/uL — AB (ref 4.40–5.90)
RDW: 16.7 % — AB (ref 11.5–14.5)
WBC: 5 x10 3/mm (ref 3.8–10.6)

## 2014-11-14 LAB — CREATININE, SERUM
Creatinine: 1.59 mg/dL — ABNORMAL HIGH (ref 0.60–1.30)
EGFR (Non-African Amer.): 44 — ABNORMAL LOW
GFR CALC AF AMER: 53 — AB

## 2014-11-15 ENCOUNTER — Encounter: Payer: Self-pay | Admitting: Surgery

## 2014-11-15 ENCOUNTER — Ambulatory Visit: Payer: Self-pay | Admitting: Internal Medicine

## 2014-12-14 LAB — CREATININE, SERUM
Creatinine: 1.71 mg/dL — ABNORMAL HIGH (ref 0.60–1.30)
EGFR (African American): 49 — ABNORMAL LOW
GFR CALC NON AF AMER: 41 — AB

## 2014-12-14 LAB — CBC CANCER CENTER
BASOS ABS: 0 x10 3/mm (ref 0.0–0.1)
Basophil %: 0.6 %
EOS ABS: 0.2 x10 3/mm (ref 0.0–0.7)
EOS PCT: 5.5 %
HCT: 33.7 % — ABNORMAL LOW (ref 40.0–52.0)
HGB: 11.1 g/dL — ABNORMAL LOW (ref 13.0–18.0)
LYMPHS PCT: 16.2 %
Lymphocyte #: 0.7 x10 3/mm — ABNORMAL LOW (ref 1.0–3.6)
MCH: 31.1 pg (ref 26.0–34.0)
MCHC: 32.8 g/dL (ref 32.0–36.0)
MCV: 95 fL (ref 80–100)
MONO ABS: 0.6 x10 3/mm (ref 0.2–1.0)
Monocyte %: 14 %
Neutrophil #: 2.9 x10 3/mm (ref 1.4–6.5)
Neutrophil %: 63.7 %
PLATELETS: 162 x10 3/mm (ref 150–440)
RBC: 3.55 10*6/uL — ABNORMAL LOW (ref 4.40–5.90)
RDW: 15.9 % — ABNORMAL HIGH (ref 11.5–14.5)
WBC: 4.5 x10 3/mm (ref 3.8–10.6)

## 2014-12-15 LAB — PROT IMMUNOELECTROPHORES(ARMC)

## 2014-12-15 LAB — KAPPA/LAMBDA FREE LIGHT CHAINS (ARMC)

## 2014-12-16 ENCOUNTER — Ambulatory Visit: Payer: Self-pay | Admitting: Internal Medicine

## 2015-02-08 ENCOUNTER — Ambulatory Visit: Payer: Self-pay | Admitting: Internal Medicine

## 2015-02-14 ENCOUNTER — Ambulatory Visit
Admit: 2015-02-14 | Disposition: A | Discharge status: Home / Self Care | Payer: Self-pay | Attending: Internal Medicine | Admitting: Internal Medicine

## 2015-03-29 ENCOUNTER — Ambulatory Visit
Admit: 2015-03-29 | Disposition: A | Discharge status: Home / Self Care | Payer: Self-pay | Attending: Internal Medicine | Admitting: Internal Medicine

## 2015-04-04 NOTE — H&P (Signed)
PATIENT NAME:  Austin Contreras, POTASH MR#:  [id number] DATE OF BIRTH:  10-09-1926  DATE OF ADMISSION: 11/23/2012  CHIEF COMPLAINT: Left abdominal pain, left flank pain, and hematuria.   HISTORY OF PRESENT ILLNESS: Mr. Villamar is an 81 year old white male who presented to the office with sudden onset of abdominal pain and hematuria 11/15. CT scan indicated the presence of a 10-mm obstructing left proximal ureteral stone. He comes in now for left ureteroscopic ureterolithotomy with holmium laser lithotripsy and possible stent placement.   ALLERGIES: Penicillin.   CURRENT MEDICATIONS:  1. Ecotrin 81 mg one a day.  2. Nasonex suspension one spray each nostril daily.  3. Nitrostat p.r.n. 4. Zofran 4 mg sublingual p.r.n.  5. Pacerone 200 mg daily.  6. Simvastatin 10 mg daily.  7. Coumadin 5 mg daily.   PAST SURGICAL HISTORY:  1. Photovaporization of the prostate and litholapaxy of bladder stone 2009. 2. Pacemaker placement 09/09/2012.  3. Coronary bypass graft and heart valve replacement September 2013.   SOCIAL HISTORY: He denied tobacco or alcohol use.   FAMILY HISTORY: Remarkable for father with heart disease.   PAST AND CURRENT MEDICAL CONDITIONS:  1. Coronary artery disease. 2. Heart valve disease.  3. History of cardiac arrhythmia status post pacemaker placement.  4. Status post aortic valve replacement September 2013.  5. Gastroesophageal reflux disease. 6. Hypertension.    REVIEW OF SYSTEMS:  The patient denied chest pain. He does have shortness of breath with exertion.   PHYSICAL EXAMINATION:  GENERAL: Elderly white male in no acute distress.   HEENT: Pupils were clear. Extraocular muscles are intact.   NECK: Supple. No palpable cervical adenopathy.   LUNGS: Clear to auscultation.   HEART: Regular rhythm and rate.   LUNGS: Clear to auscultation.  ABDOMEN:  Soft. No CVA tenderness.   GENITOURINARY: Uncircumcised. Testes smooth, nontender.   RECTAL: 35-gram, smooth  nontender prostate.   NEUROMUSCULAR: Nonfocal. The patient was alert and oriented times three.   IMPRESSION: Left ureterolithiasis.   PLAN: Left ureteroscopic ureterolithotomy with holmium laser lithotripsy and possible stent placement.  ____________________________ Otelia Limes. Yves Dill, MD [email or web address] D: 11/16/2012 12:02:00 ET T: 11/16/2012 12:28:22 ET JOB#: [id number]  cc: Otelia Limes. Yves Dill, MD, <Dictator> Royston Cowper MD ELECTRONICALLY SIGNED 11/17/2012 13:00

## 2015-04-04 NOTE — Op Note (Signed)
PATIENT NAME:  Austin Contreras, Austin Contreras MR#:  [id number] DATE OF BIRTH:  [birth year]  DATE OF PROCEDURE:  11/23/2012  PREOPERATIVE DIAGNOSIS: Left ureterolithiasis.   POSTOPERATIVE DIAGNOSIS: Left ureterolithiasis.   PROCEDURES:  1. Left ureteroscopic ureterolithotomy with holmium laser lithotripsy.  2. Stent placement.  3. Fluoroscopy.   SURGEON: Otelia Limes. Yves Dill, MD   ANESTHETISTS: Dr. [place] Service and Dr. Yves Dill    ANESTHETIC METHOD: General per Dr. [place] Service and local per Dr. Yves Dill    INDICATIONS: See the dictated history and physical. After informed consent, the patient requests the above procedure.   OPERATIVE SUMMARY: After adequate general anesthesia had been obtained, the patient was placed into dorsal lithotomy position and the perineum was prepped and draped in the usual fashion. Fluoroscopy indicated the presence of a 15 x 15 mm stone in the left upper ureter. At this point, the 57 French cystoscope was coupled with the camera and visually advanced into the bladder. The bladder was thoroughly inspected. The bladder was heavily trabeculated. Both ureteral orifices were identified and had clear efflux. No bladder tumors were identified. At this point, a 0.035 Glidewire was advanced through the scope and up the left ureter under fluoroscopic guidance. Guidewire was then passed beyond the stone and curled into the renal pelvis. The intramural ureter was dilated up to 4 mm with a balloon dilating catheter. The balloon was then removed taking care to leave the guidewire in position. Cystoscope was also removed again taking care to leave the guidewire in position. A ureteral access sheath was then passed over the guidewire and positioned in the distal ureter using fluoroscopic guidance. The flexible ureteroscope was then passed through the access sheath to the level of the stone. The 300 micron holmium laser fiber was passed through the scope and the stone was partially fragmented. Prior to complete  fragmentation, the stone migrated back into the renal pelvis. Several attempts were made to try to further fragment the stone but the stone was extremely mobile in the pelvis and moved with each attempted laser shock. Also, the pelvis was quite cloudy from debris. Therefore, the ureteroscope was removed taking care to leave the guidewire in position. Access sheath was removed. Cystoscope was back-loaded over the guidewire and a 6 x 26 cm double pigtail stent was advanced over the guidewire and positioned in the ureter. Guidewire was removed taking care to leave the stent in position. Bladder was drained and the cystoscope was removed. 10 mL of viscous Xylocaine was instilled within the urethra and the bladder. The procedure was then terminated and the patient was transferred to the recovery room in stable condition.   ____________________________ Otelia Limes. Yves Dill, MD [email or web address] D: 11/23/2012 13:01:17 ET T: 11/23/2012 13:14:52 ET JOB#: [id number]  cc: Otelia Limes. Yves Dill, MD, <Dictator>  Royston Cowper MD ELECTRONICALLY SIGNED 11/24/2012 8:48

## 2015-04-05 LAB — CBC CANCER CENTER
Basophil #: 0 x10 3/mm (ref 0.0–0.1)
Basophil %: 0.8 %
EOS PCT: 4.5 %
Eosinophil #: 0.2 x10 3/mm (ref 0.0–0.7)
HCT: 37.4 % — AB (ref 40.0–52.0)
HGB: 12.2 g/dL — ABNORMAL LOW (ref 13.0–18.0)
LYMPHS ABS: 1 x10 3/mm (ref 1.0–3.6)
Lymphocyte %: 22.5 %
MCH: 30.1 pg (ref 26.0–34.0)
MCHC: 32.6 g/dL (ref 32.0–36.0)
MCV: 93 fL (ref 80–100)
MONOS PCT: 13.4 %
Monocyte #: 0.6 x10 3/mm (ref 0.2–1.0)
NEUTROS ABS: 2.7 x10 3/mm (ref 1.4–6.5)
Neutrophil %: 58.8 %
PLATELETS: 162 x10 3/mm (ref 150–440)
RBC: 4.04 10*6/uL — AB (ref 4.40–5.90)
RDW: 14.8 % — ABNORMAL HIGH (ref 11.5–14.5)
WBC: 4.6 x10 3/mm (ref 3.8–10.6)

## 2015-04-05 LAB — CREATININE, SERUM
Creatinine: 1.82 mg/dL — ABNORMAL HIGH
EGFR (African American): 38 — ABNORMAL LOW
EGFR (Non-African Amer.): 33 — ABNORMAL LOW

## 2015-04-07 LAB — KAPPA/LAMBDA FREE LIGHT CHAINS (ARMC)

## 2015-04-07 LAB — PROT IMMUNOELECTROPHORES(ARMC)

## 2015-04-07 NOTE — Op Note (Signed)
PATIENT NAME:  Austin Contreras, Austin Contreras MR#:  [id number] DATE OF BIRTH:  1926/06/25  DATE OF PROCEDURE:  01/11/2013  PREOPERATIVE DIAGNOSIS: Left nephrolithiasis.  POSTOPERATIVE DIAGNOSIS: Left nephrolithiasis.  PROCEDURE: Cystoscopy with stent removal.   SURGEON: Maryan Puls, MD  ANESTHETIST: Julianne Handler, MD / Maryan Puls, MD  ANESTHETIC METHOD: General per [place] Service and local per Dr. Yves Dill.   INDICATIONS: See the dictated history and physical. After informed consent, the patient requests the above procedure.   OPERATIVE SUMMARY: After adequate general anesthesia had been obtained, the patient was placed into dorsal lithotomy position and the perineum was prepped and draped in the usual fashion. A 21 French cystoscope was coupled with the camera and then visually advanced into the bladder. The patient had lateral lobe prostatic hypertrophy. No bladder tumors were identified. The stent was identified exiting the left orifice. Stent was engaged with the alligator forceps and removed. 10 mL of viscous Xylocaine was instilled within the urethra. The procedure was then terminated and the patient was transferred to the recovery room in stable condition.  ____________________________ Otelia Limes. Yves Dill, MD [email or web address]:sb D: 01/11/2013 09:50:33 ET T: 01/11/2013 09:57:04 ET JOB#: [id number]  cc: Otelia Limes. Yves Dill, MD, <Dictator> Royston Cowper MD ELECTRONICALLY SIGNED 01/12/2013 9:22

## 2015-04-07 NOTE — H&P (Signed)
PATIENT NAME:  Austin Contreras, Austin Contreras MR#:  [id number] DATE OF BIRTH:  12-03-1926  DATE OF ADMISSION:  01/11/2013  CHIEF COMPLAINT: Nephrolithiasis.   HISTORY OF PRESENT ILLNESS: Austin Contreras is an 81 year old white male with obstructing left UPJ stone, underwent stent placement December 9th, followed by lithotripsy December 19th. He comes in now for stent removal.   ALLERGIES: PENICILLIN.   CURRENT MEDICATIONS: Ecotrin, Nasonex, Nitrostat p.r.n., Zofran, Pacerone, simvastatin and Coumadin.   PAST SURGICAL HISTORY: Vaporization of the prostate in 2009, coronary artery bypass, pacemaker placement, and aortic valve replacement September 2013.   SOCIAL HISTORY: He denied tobacco or alcohol use.   FAMILY HISTORY: Noncontributory.   PAST AND CURRENT MEDICAL CONDITIONS:  1. Coronary artery disease.  2. Aortic valve disease, status post replacement.  3. GERD. 4. Unspecified cardiac arrhythmia.  5. Hypertension.   REVIEW OF SYSTEMS: The patient denied chest pain or shortness of breath. He also denied diabetes or stroke.   PHYSICAL EXAMINATION: GENERAL: Elderly white male in no distress.  HEENT: Sclerae were clear. Pupils were equally round and reactive to light and accommodation. Extraocular movements were intact.  NECK: Supple. No palpable adenopathy.  LUNGS: Clear to auscultation.  CARDIOVASCULAR: Regular rhythm and rate.  GENITOURINARY: Deferred.  GASTROINTESTINAL: Abdomen was soft and nontender.  NEUROMUSCULAR: Alert and oriented x 3, nonfocal.   IMPRESSION: Left nephrolithiasis, status post ESL and stent placement.   PLAN: Cysto with stent removal.   ____________________________ Otelia Limes. Yves Dill, MD [email or web address]:[id number] D: 01/06/2013 11:12:48 ET T: 01/06/2013 11:25:34 ET JOB#: [id number]  cc: Otelia Limes. Yves Dill, MD, <Dictator> Royston Cowper MD ELECTRONICALLY SIGNED 01/11/2013 7:17

## 2015-04-08 NOTE — H&P (Signed)
PATIENT NAME:  Austin Contreras, Austin Contreras MR#:  [id number] DATE OF BIRTH:  12-Jul-1926  DATE OF ADMISSION:  02/14/2014  CHIEF COMPLAINT: Gross hematuria due to bladder tumor.   HISTORY OF PRESENT ILLNESS: Mr. Willers is an 81 year old white male who developed gross hematuria. A CT scan was performed December 31 and revealed abnormalities of the bladder wall. He subsequently underwent cystoscopy in the office on 01/21 indicating a 10 x 10 mm papillary bladder tumor of the posterior wall as well as areas of carcinoma in situ of the posterior wall. He also had a small posterior wall bladder diverticulum. The patient comes in now for transurethral resection of the bladder lesions. He has been cleared for this procedure by his cardiologist, who is Dr. Neoma Laming.   ALLERGIES: PENICILLIN.   CURRENT MEDICATIONS: Metoprolol, sotalol, simvastatin, Lasix and potassium chloride.   PAST SURGICAL HISTORY:  Photovaporization of the prostate with green light laser in 2009, pacemaker placement, aortic heart valve replacement and coronary bypass graft in 2013, ureteroscopic ureterolithotomy with holmium laser lithotripsy December 2013, lithotripsy December 2013, cystoscopy with stent removal of January 2014.   SOCIAL HISTORY: The patient denied tobacco use. He also denied alcohol use.   FAMILY HISTORY: Noncontributory.   PAST AND CURRENT MEDICAL CONDITIONS:  1.  Coronary artery disease.  2.  Aortic valve disease.  3.  GERD. 4.  Cardiac arrhythmia.  5.  Hypertension.   REVIEW OF SYSTEMS: The patient denied chest pain, shortness of breath, diabetes, or stroke.   PHYSICAL EXAMINATION: GENERAL: A well-nourished white male in no acute distress.  HEENT: Sclerae were clear.  NECK: Supple. No palpable cervical adenopathy.  LUNGS: Clear to auscultation.   CARDIOVASCULAR:  Regular rhythm and rate without audible murmurs.  ABDOMEN: Soft, nontender abdomen.  GENITOURINARY: Circumcised testes smooth and nontender.  RECTAL:  40 grams smooth nontender prostate.  NEUROMUSCULAR: Alert and oriented x 3.   IMPRESSION: Bladder cancer.   PLAN: Transurethral resection of bladder tumor     ____________________________ Otelia Limes. Yves Dill, MD [email or web address] D: 02/09/2014 10:23:50 ET T: 02/09/2014 10:42:18 ET JOB#: [id number]  cc: Otelia Limes. Yves Dill, MD, <Dictator> Royston Cowper MD ELECTRONICALLY SIGNED 02/09/2014 16:11

## 2015-04-08 NOTE — Consult Note (Signed)
Reason for Visit: This 81 year old Male patient presents to the clinic for initial evaluation of  bladder cancer .   Referred by Dr. Inez Pilgrim.  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 Patient to receive concurrent platinum based chemotherapy with radiation based on advanced age and inability to undergo resection  Pathology Report pathology report reviewed   Imaging Report CT scans reviewed   Referral Report clinical notes reviewed   Planned Treatment Regimen concurrent chemotherapy and radiation therapy   HPI   patient is an 81 year old male who has had hematuria grossly for over a year. CT scan performed 12/15/2013 showed abnormalities of the bladder wall underwent cystoscopy in the office of urology showing papillary bladder tumor the posterior wall as well as areas of carcinoma in situ in the posterior wall. He then underwent on 02/14/2014 transurethral resection by Dr. Yves Dill for high grade transitional cell carcinoma withlymphovascular invasion as well as at leastinvasion of the subepithelial connective tissue. Patient has done well postoperatively. Had a recent CT scan performed today showing abnormal thickening of the urinary bladder with no convincing evidence of metastatic disease in the pelvis. He's been seen by medical oncology and is now referred to radiation oncology for consideration of treatment.patient's significant comorbidities including previous bypass surgery and heart valve replacement.  Past Hx:    Renal Colic, Stones:    Heart Valve Replacement: Sep 2013   gastric bleeding:    possible leaking valve:    HTN:    sinus infections:    Bypass x 2: Sep 2013   Pacemaker Placement: Sep 2013   Lithotripsy: h/o kidney stones; stent removal, Dec 2013   appendectomy:   Past, Family and Social History:  Past  Medical History positive   Cardiovascular CABG performed; coronary artery disease; hypertension; pacemaker   Respiratory sinus infections   Gastrointestinal GERD   Genitourinary kidney stones; status post lithotripsy   Past Surgical History appendectomy   Family History positive   Family History Comments sister with lymphoma   Social History positive   Social History Comments 40 pack year smoking history quit smoking many years prior no EtOH use history   Additional Past Medical and Surgical History accompanied by wife and daughter today   Allergies:   PCN: Rash  flu vaccines: Headaches  Home Meds:  Home Medications: Medication Instructions Status  hyoscyamine 0.125 milligram(s) sublingual every 4 to 6 hours, As needed, bladder spasms Active  tapentadol 50 mg oral tablet 1 tab(s) orally every 4 to 6 hours, As needed, pain Active  ondansetron 8 mg oral tablet, disintegrating 1 tab(s) orally every 4 hours, As needed, nausea, vomiting Active  Metoprolol Tartrate 25 mg oral tablet 1 tab(s) orally 2 times a day Active  Klor-Con 10 10 mEq oral tablet, extended release 1 tab(s) orally once a day, As Needed, when taking lasix Active  sotalol 80 mg oral tablet 1 tab(s) orally 2 times a day Active  simvastatin 10 mg oral tablet 1 tab(s) orally once a day (at bedtime) Active  Nitrostat 0.4 mg sublingual tablet 1 tab(s) sublingual every 5 minutes, As Needed Active  aspirin 81 mg oral tablet 1 tab(s) orally once a day Active  Lasix 40 mg oral tablet 1 tab(s) orally once a day, As Needed Active  enalapril 2.5 mg oral tablet 1 tab(s) orally once a day Active  hyoscyamine  0.125 mg oral tablet 1 tab(s) orally 4 times a day, As Needed Active   Review of Systems:  General negative   Performance Status (ECOG) 0   Skin negative   Breast negative   Ophthalmologic negative   ENMT negative   Respiratory and Thorax negative   Cardiovascular see HPI   Gastrointestinal negative    Genitourinary see HPI   Musculoskeletal negative   Neurological negative   Psychiatric negative   Hematology/Lymphatics negative   Endocrine negative   Allergic/Immunologic negative   Review of Systems   review of systems obtained from nurses notes  Nursing Notes:  Nursing Vital Signs and Chemo Nursing Nursing Notes: *CC Vital Signs Flowsheet:   26-Mar-15 13:36  Temp Temperature 95.7  Pulse Pulse 88  Respirations Respirations 20  SBP SBP 143  DBP DBP 99  Pain Scale (0-10)  0  Current Weight (kg) (kg) 78   Physical Exam:  General/Skin/HEENT:  General normal   Skin normal   Eyes normal   ENMT normal   Head and Neck normal   Additional PE well-developed elderly gentleman in NAD. Lungs are clear to A&P cardiac examination shows regular rate and rhythm. Abdomen is benign. No inguinal adenopathy is identified. No peripheral edema in the lower extremities is noted.   Breasts/Resp/CV/GI/GU:  Respiratory and Thorax normal   Cardiovascular normal   Gastrointestinal normal   Genitourinary normal   MS/Neuro/Psych/Lymph:  Musculoskeletal normal   Neurological normal   Lymphatics normal   Other Results:  Radiology Results: LabUnknown:    31-Dec-14 17:04, CT Abdomen Pelvis WO for Stone  PACS Image     26-Mar-15 11:22, CT Chest Abdomen and Pelvis WO  PACS Image   CT:    31-Dec-14 17:04, CT Abdomen Pelvis WO for Stone  CT Abdomen Pelvis WO for Stone   REASON FOR EXAM:    left flank pain with hematuria  COMMENTS:   LMP: (Male)    PROCEDURE: CT  - CT ABDOMEN /PELVIS WO (STONE)  - Dec 15 2013  5:04PM     CLINICAL DATA:  Possible left-sided kidney stone. Pain and  hematuria. Left flank pain.    EXAM:  CT ABDOMEN AND PELVIS WITHOUT CONTRAST    TECHNIQUE:  Multidetector CT imaging of the abdomen and pelvis was performed  following the standard protocol without IV contrast.  COMPARISON:  04/27/2013    FINDINGS:  Lower Chest: Centrilobular emphysema.  Mild cardiomegaly with a pacer  in place. Small hiatal hernia.    Abdomen/Pelvis: Normal infused appearance of the liver, spleen,  distal stomach.    Normal pancreas. Cholelithiasis without acute cholecystitis or  biliary ductal dilatation.    Normal adrenal glands. Bilateral renal atrophy. Left renal vascular  calcification. Too small to characterize exophytic upper pole left  renal lesion of approximately 7 mm. The low-density upper pole right  renal lesion measures less than a cm and is also too small to  characterize on coronal image 96. Lower pole left renal collecting  system calculus of 11 mm.    No hydroureter or ureteric calculi.    No retroperitoneal or retrocrural adenopathy. Extensive colonic  diverticulosis. Normal terminal ileum.Normal small bowel without  abdominal ascites. No pelvic adenopathy. Fat containing left  inguinal hernia.    Moderate prostatomegaly. Soft tissue irregularity at the anterior  bladder dome, including on image 62. This measures maximally 2.1 cm.  More inferior anterior bladder wall irregularity on sagittal image  90. No significant free fluid.  Fat containing  ventral abdominal wall hernia.    Bones/Musculoskeletal: No acute osseous abnormality. Degenerative  disc disease is advanced at L4-S1.     IMPRESSION:  1. Left nephrolithiasis without urinary tract obstruction or  ureteric stone.  2. Multiple foci of anterior bladder wall irregularity. Cannot  exclude transitional cell carcinoma or carcinomas. Urology  consultation and consideration of cystoscopy suggested.  3. Cholelithiasis.  4. Prostatomegaly.    Electronically Signed    By: Abigail Miyamoto M.D.    On: 12/15/2013 17:26         Verified By: Areta Haber, M.D.,    26-Mar-15 11:22, CT Chest Abdomen and Pelvis WO  CT Chest Abdomen and Pelvis WO   REASON FOR EXAM:    ORAL ONLY FOR ABD AND PELVIS Bladder CA Staging Renal   Failure  COMMENTS:       PROCEDURE: CT  - CT CHEST  ABDOMEN AND PELVIS WO  - Mar 10 2014 11:22AM     CLINICAL DATA:  Bladder cancer diagnosed 2 weeks ago, renal failure.  Staging.    EXAM:  CT CHEST, ABDOMEN AND PELVIS WITHOUT CONTRAST    TECHNIQUE:  Multidetector CT imaging of the chest, abdomen and pelvis was  performed following the standard protocol without IV contrast.  COMPARISON:  CT STONE STUDY dated 12/15/2013    FINDINGS:  CT CHEST FINDINGS    2 mm ground-glass nodule in right upper lobe, and within the right  lower lobe below size surveillance recommendations. 2 mm pulmonary  nodule left lower lobe, 40/65. No pleural effusions, focal  consolidations, or masses. Mild centrilobular emphysema. Trace  bronchial wall thickening, tracheobronchial tree is patent and  midline. No pneumothorax.    Heart size is mildly enlarged, pericardium is nonsuspicious. Status  post median sternotomy with cardiac pacemaker inplace. Thoracic  aorta is normal in course and caliber with moderate calcific  atherosclerosis. The left vertebral artery arises directly from the  arch, a normal variant. No lymphadenopathy by CT size criteria,  however sensitivity may be decreased by lack of intravenous  contrast. Porta 5 mm aortopulmonary and prevascular lymph nodes.  Soft tissues are nonsuspicious. Mild degenerative change of thoracic  spine.    CT ABDOMEN AND PELVIS FINDINGS    The liver, spleen, pancreas and adrenal gland are unremarkable.  Multiple tiny gallstones without superimposed inflammatory changes  to suggest acute cholecystitis.    Small hiatal hernia. Stomach, small and large bowel are normal in  course and caliber without inflammatory changes. Colonic  diverticulosis without CT findings of acute diverticulitis. No  intraperitoneal free fluid nor free air.    8 mm too small to characterize exophytic probable cyst in right  interpolar kidney and subcentimeter exophytic left upper pole  probable cyst. Left lower pole 5 x 9 mm  nephrolithiasis without  hydronephrosis. Ureters are normal in course and caliber. Again  noted is abnormal bladder or bowel wall thickening with surrounding.  Perivesicular inflammatory changes. The prostate is enlarged, 6 cm  in transaxial dimension invading the base of the urinary bladder. No  definite lymphadenopathy on this noncontrast examination.    Aortoiliac vessels are overall normal in course and caliber with  mild calcific atherosclerosis. Small fat containing umbilical  hernia. Moderate left and small right fat containing inguinal  hernias. No destructive bony lesions. Status post L4-5 laminectomy.  Grade 1 L4-5 anterolisthesis. Severe L5-S1 degenerative disc  disease.     IMPRESSION:  CT chest: Nonspecific 2 mm ground-glass and solid pulmonary nodules,  without convincing evidence of metastatic disease within the chest.    No acute cardiopulmonary process.    CT abdomen and pelvis: Abnormally thickened urinary bladder with  perivesicular inflammatory changes, a background of prostatomegaly.  No convincing evidence of metastatic disease within the abdomen and  pelvis on this noncontrast examination.  No acute intra-abdominal or pelvic process.    9 mm nonobstructing left lower pole nephrolithiasis. Cholelithiasis.      Electronically Signed    By: Elon Alas    On: 03/10/2014 13:25         Verified By: Ricky Ala, M.D.,   Relevent Results:   Relevant Scans and Labs serial CT scans are reviewed   Assessment and Plan: Impression:   at least stage II high-grade transitional cell carcinoma of the bladder an 81 year old male with multiple cardiac comorbidities Plan:   i discussed the case personally with medical oncology. Would recommend radiation therapy up to 6200 cGy. Would treat his large fetal pelvis to 4500 cGy boosting the posterior bladder wall to take total dose 2 area of known tumor involvement is 6200 cGy. Also discussed treating the  patient with weekly carboplatinum based on his decreased renal function with medical oncology. Risks and benefits of treatment including increased urinary frequency urgency nocturia, possible diarrhea, possible alteration of blood counts, possible fatigue, although explained in detail to the patient and his family. I have set patient up for CT simulation early next week. We'll coordinate his weekly chemotherapy with medical oncology.  I would like to take this opportunity for allowing me to participate in the care of your patient..  CC Referral:  cc: Dr. Mason Jim. Juanda Bond   Electronic Signatures: Baruch Gouty Roda Shutters (MD)  (Signed 26-Mar-15 14:53)  Authored: HPI, Diagnosis, Past Hx, PFSH, Allergies, Home Meds, ROS, Nursing Notes, Physical Exam, Other Results, Relevent Results, Encounter Assessment and Plan, CC Referring Physician   Last Updated: 26-Mar-15 14:53 by Armstead Peaks (MD)

## 2015-04-08 NOTE — Op Note (Signed)
PATIENT NAME:  Austin Contreras, Austin Contreras MR#:  [id number] DATE OF BIRTH:  [birth year]  DATE OF PROCEDURE:  07/12/2014  PREOPERATIVE DIAGNOSIS: Left knee prepatellar bursa.   POSTOPERATIVE DIAGNOSIS: Left knee prepatellar bursa.   PROCEDURE: Excision bursa, left prepatellar tendon.   ANESTHESIA: General.   SURGEON: Hessie Knows, M.D.   DESCRIPTION OF PROCEDURE: The patient was brought to the operating room and after adequate anesthesia was obtained, the left leg was prepped and draped in the usual sterile fashion with a tourniquet applied to the upper thigh, but not utilized during the procedure.   After appropriate patient identification and timeout procedure were completed an anterior midline skin incision was made and the bursa opened. There was clear fluid within the bursa. The bursal sac was excised by sharp dissection with a scalpel and cautery was also used to ablate the tissue at the periphery to remove the pathologic tissue. This was sent as a specimen. Next, after debridement of the bursal tissue, excess skin was removed as the bursa had been quite large, it had stretched the skin out. Elliptical excision of the lateral skin was made and the wound was then closed with 3-0 chromic and skin staples and 20 mL of 0.5% Sensorcaine were infiltrated for postoperative analgesia. There were no complications.   SPECIMEN: Excised bursa.   ESTIMATED BLOOD LOSS: Minimal.  The wound was dressed with Xeroform, 4 x 4's, Webril and an Ace wrap, and the patient was sent to the recovery room in stable condition.    ____________________________ Laurene Footman, MD mjm:jh D: 07/12/2014 18:24:40 ET T: 07/12/2014 19:09:46 ET JOB#: [id number]  cc: Laurene Footman, MD, <Dictator> Laurene Footman MD ELECTRONICALLY SIGNED 07/13/2014 8:20

## 2015-04-08 NOTE — Op Note (Signed)
PATIENT NAME:  Austin Contreras, Austin Contreras MR#:  [id number] DATE OF BIRTH:  07/11/1926  DATE OF PROCEDURE:  02/14/2014  PREOPERATIVE DIAGNOSIS: Bladder cancer.   POSTOPERATIVE DIAGNOSIS:  Bladder cancer.  PROCEDURES: 1.  Transurethral resection of bladder tumor.  2.  Instillation of mitomycin-C into the bladder.   SURGEON:  Yves Dill.   ANESTHETIST: Shayne Alken.   ANESTHETIC METHOD: General per Andree Elk and local per Yves Dill.   INDICATIONS: See the dictated history and physical. After informed consent, the patient requested the above procedures.   OPERATIVE SUMMARY: After general anesthesia had been obtained, the patient was placed into dorsal lithotomy position and the perineum was prepped and draped in the usual fashion. A 27-French resectoscope sheath was advanced into the bladder with the obturator in place. The resectoscope was then coupled with the camera and then placed into the sheath. The bladder was thoroughly inspected. Both ureteral orifices were identified and had clear efflux. The patient had a TURP defect and prostatic fossa was widely patent. The patient had a 10 x 10 cm papillary tumor along the posterior wall extending to near the dome. He also had 4 satellite lesions measuring 5 mm in size consistent with carcinoma in situ. At this point, the central papillary tumor was resected and fragments submitted to pathology. The base of this tumor and the areas of carcinoma in situ were cauterized using the rollerball electrode. All bleeders were attended to with cautery. At this point, the bladder was drained and the scope was removed. A 89-QJJHER silicone catheter was placed and irrigated until clear.  10 mL of viscous Xylocaine was instilled within the urethra and the bladder. Mitomycin-C 40 mg was then instilled into the bladder and the catheter was plugged. B and O suppository was placed. The procedure was then terminated, and the patient was transferred to the recovery room in stable condition.       ____________________________ Otelia Limes. Yves Dill, MD mrw:dmm D: 02/14/2014 10:18:55 ET T: 02/14/2014 23:02:40 ET JOB#: [id number]  cc: Otelia Limes. Yves Dill, MD, <Dictator> Dionisio David, MD Royston Cowper MD ELECTRONICALLY SIGNED 02/15/2014 13:28

## 2015-04-08 NOTE — H&P (Signed)
PATIENT NAME:  Austin Contreras, BUXTON MR#:  [id number] DATE OF BIRTH:  June 22, 1926  DATE OF ADMISSION:  12/19/2013  REFERRING PHYSICIAN: Dr. Cinda Quest.   PRIMARY CARE PHYSICIAN: Dr. Neoma Laming.   CHIEF COMPLAINT: Chest pain, hematuria.  HISTORY OF PRESENT ILLNESS:  An 81 year old Caucasian, now with past medical history of coronary artery disease status post CABG, permanent pacemaker placement, as well as a recent urinary retention status post Foley catheter placement approximately two days ago, presenting with chest pain and hematuria. He described his chest pain as two day duration, intermittent, dull, retrosternal, nonradiating, worse with belching. He denies any cough, palpitations or shortness of breath. He found relief with belching. He has also noted  hematuria in his Foley to leg bag which was recently placed for urinary retention approximately two days ago. Currently, he is without complaints.   REVIEW OF SYSTEMS:   CONSTITUTIONAL: Denies fever, fatigue, weakness.  EYES: Denies blurred vision, double vision, eye pain.  EARS, NOSE, THROAT: Denies tinnitus, ear pain, hearing loss.  RESPIRATORY: Denies cough, wheeze, shortness of breath.  CARDIOVASCULAR: Positive for chest pain as described above; however, denies any orthopnea, edema, palpitations.  GASTROINTESTINAL: Denies nausea, vomiting, diarrhea, abdominal pain.  GENITOURINARY: Positive for hematuria as described above. Denies any dysuria or change in urinary frequency.  ENDOCRINE: Denies nocturia or thyroid problems.  HEMATOLOGIC:  Denies easy bruising. Positive for bleeding as described above, hematuria.  SKIN: Denies rash or lesions.  MUSCULOSKELETAL: Denies pain in neck, back, shoulder, knees, hips, arthritic symptoms.  NEUROLOGIC: Denies paralysis, paresthesias.  PSYCHIATRIC: Denies anxiety or depressive symptoms.  Otherwise, full review of systems performed by me is negative.   PAST MEDICAL HISTORY: Significant for coronary artery  disease status post CABG, permanent pacemaker placement, heart valve replacement,  history of GI bleed, history of chronic anemia, history of nephrolithiasis followed by Dr. Yves Dill of urology, as well as hypertension.   SOCIAL HISTORY: Denies any tobacco usage, positive for occasional alcohol usage. Denies any drug usage.   FAMILY HISTORY: Denies any known bladder cancer or neurological problems.   ALLERGIES: FLU VACCINES AND PENICILLIN.   HOME MEDICATIONS: Include aspirin 81 mg p.o. daily, Percocet 5/325 mg p.o. q.6 hours as needed for pain, sotalol 80 mg p.o. b.i.d., metoprolol 25 mg p.o. b.i.d., Lasix 40 mg daily, potassium 10 mEq daily.   PHYSICAL EXAMINATION: VITAL SIGNS: Temperature 98.1, heart rate 70, respirations 18, blood pressure 103/66, saturating 97% on room air. Weight 77.1 kg, BMI 24.4.  GENERAL: Well-nourished, well-developed Caucasian gentleman, currently in no acute distress.  HEAD: Normocephalic, atraumatic.  EYES: Pupils equal, round, reactive to light, extraocular muscles intact. No scleral icterus.  MOUTH: Moist mucous membranes. Dentition intact. No abscess noted.  EARS, NOSE, THROAT; Throat clear without exudates. No external lesions.  NECK: Supple. No thyromegaly. No nodules. No JVD.  PULMONARY: Clear to auscultation bilaterally. No wheezes, rubs or rhonchi. No accessory muscles. Good respiratory effort. CHEST: Nontender to palpation.  CARDIOVASCULAR: S1, S2, regular rate and rhythm. S2 has a louder component. No murmurs, rubs or gallops. No edema. Pedal pulses 2+ bilaterally.  GASTROINTESTINAL: Soft, nontender, nondistended. No masses. Positive bowel sounds. No hepatosplenomegaly.  MUSCULOSKELETAL: No swelling, clubbing or edema. Range of motion full in all extremities.  GENITOURINARY: Foley catheter in place with reddish-brown urine in Foley bag.  NEUROLOGIC: Cranial nerves II through XII intact. No gross neurological deficits. Sensation intact. Reflexes intact.    SKIN: No ulcerations, lesions, rashes, cyanosis. Skin warm, dry. Turgor is intact.  PSYCHIATRIC:  Mood and affect within normal limits. The patient is alert and oriented x3. Insight and judgment intact.   LABORATORY DATA: He had a recent abdominal CT performed on December 31st revealing left nephrolithiasis without urinary tract obstruction or ureter stones, however, they are all multiple foci of the anterior bladder wall which are irregular. Sodium 132, potassium 5.4, chloride 103, bicarb 27, BUN 37, creatinine 2.44, glucose 108. Troponin I less than 0.02. WBC 10.7, hemoglobin 12.7, platelets of 166. Urinalysis grossly bloody, RBCs 23970.  WBCs 41.   ASSESSMENT AND PLAN: An 81 year old gentleman, history of coronary artery disease, status post coronary artery bypass graft, as well as recent urinary retention status post Foley placement, presenting with chest pain and hematuria. 1.  Chest pain. Has cardiac history, thus seems extremely unlikely this is cardiac in nature. His EKG is paced. His cardiac enzymes are negative thus far. We will trend enzymes. Admit to observation to telemetry.  2.  Hematuria. Multiple possible etiologies including of nephrolithiasis which he follows with  Dr. Yves Dill. Also, recently had a Foley catheter placed.  However, the most concerning is the bladder wall irregularity. We will consult urology, Dr. Rogers Blocker. Will likely need a cystoscopy at some point in time if not already performed.  3.  Mild acute kidney injury. IV fluid hydration. 4. Coronary artery disease, status post coronary artery bypass graft. Continue Lopressor and aspirin.  5.  Deep venous thrombosis left with sequential compression devices.  6.  CODE STATUS: The patient is full code.   TIME SPENT: 45 minutes.     ____________________________ Aaron Mose. Iveliz Garay, MD dkh:NTS D: 12/19/2013 00:16:44 ET T: 12/19/2013 00:49:58 ET JOB#: [id number]  cc: Aaron Mose. Laquanta Hummel, MD, <Dictator> Oley Lahaie Woodfin Ganja MD ELECTRONICALLY  SIGNED 12/19/2013 20:10

## 2015-04-08 NOTE — H&P (Signed)
PATIENT NAME:  Austin Contreras, Austin Contreras MR#:  [id number] DATE OF BIRTH:  December 26, 1925  DATE OF ADMISSION: 09/06/2014   CHIEF COMPLAINT: Bladder cancer.   HISTORY OF PRESENT ILLNESS: Mr. Reaver is an 81 year old Caucasian male with history of bladder cancer that was found to have invasion into the subepithelial tissue in March. He opted for radiation therapy and completed that recently. He underwent surveillance cystoscopy on September 11 and was found to have a 10 x 10-mm bladder tumor in the posterior wall. He comes in now for tumor resection. The patient has had thorough preoperative evaluation for cardiac risk by Dr. Humphrey Rolls including stress testing and he has been determined to be as stable as possible for this procedure.   ALLERGIES: PENICILLIN.   CURRENT MEDICATIONS: Metoprolol, sotalol, simvastatin, Lasix, and potassium chloride. He also takes aspirin 81 mg per day.   SOCIAL HISTORY: The patient denied tobacco or alcohol use.   FAMILY HISTORY: Noncontributory.   PAST SURGICAL HISTORY: Photovaporization of the prostate with Julianne Rice laser in 2009. Pacemaker placement. Aortic valve replacement and coronary bypass graft 2013, ureteroscopic ureterolithotomy with holmium laser lithotripsy 2013, lithotripsy 2013, cystoscopy with stent retrieval 2014, transurethral resection of bladder tumor in March 2015.   PAST AND CURRENT MEDICAL CONDITIONS:  1.  Coronary artery disease.  2.  Aortic valve disease.  3.  GERD.  4.  History of cardiac arrhythmia status post pacemaker placement.  5.  Hypertension.   REVIEW OF SYSTEMS: The patient denied chest pain, shortness of breath, diabetes, or stroke.   PHYSICAL EXAMINATION:  GENERAL: A well-nourished white male in no acute distress.  HEENT: Sclerae were clear. Pupils were equally round, reactive to light and accommodation. Extraocular movements were intact.  NECK: Supple. No palpable cervical adenopathy. No audible carotid bruits.  LUNGS: Clear to auscultation.   CARDIOVASCULAR: Regular rhythm and rate without audible murmurs.  ABDOMEN: Soft, nontender abdomen.  GENITOURINARY: Circumcised. Testes were smooth, normal size and shape.  RECTAL:  A 40-gram, smooth nontender prostate.  NEUROMUSCULAR: Alert and oriented x3.   IMPRESSION: Recurrent bladder cancer.   PLAN: Transurethral resection of bladder tumor.    ____________________________ Otelia Limes. Yves Dill, MD mrw:lt D: 09/01/2014 08:17:00 ET T: 09/01/2014 08:50:01 ET JOB#: [id number]  cc: Otelia Limes. Yves Dill, MD, <Dictator> Royston Cowper MD ELECTRONICALLY SIGNED 09/01/2014 15:54

## 2015-04-08 NOTE — Op Note (Signed)
PATIENT NAME:  Austin Contreras, Austin Contreras MR#:  [id number] DATE OF BIRTH:  1926-07-31  DATE OF PROCEDURE:  09/06/2014  PREOPERATIVE DIAGNOSIS: Bladder cancer.   POSTOPERATIVE DIAGNOSIS: Bladder cancer.   PROCEDURES: 1.  Transurethral resection of bladder tumor.  2.  Instillation of mitomycin into the bladder.   SURGEON: Maryan Puls, M.D.   ANESTHETIST: Dr. [place] Service   ANESTHETIC METHOD: General.   INDICATIONS: See the dictated history and physical. After informed consent, the patient requests the above procedure.   OPERATIVE SUMMARY: After adequate general anesthesia had been obtained, the patient was placed into a dorsal lithotomy position and the perineum was prepped and draped in the usual fashion. The 28-French resectoscope sheath was then advanced into the bladder with the obturator in place. The resectoscope was coupled with a camera and then placed into the sheath. The bladder was thoroughly inspected. The patient had heavily trabeculated bladder. The patient had a 10-mm by 10 mm. papillary tumor on the bladder wall. 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 Both ureteral orifices were identified and had clear efflux. The patient had a TURBT defect with minimal regrowth of the BPH tissue laterally. At this point, the tumor was resected and fragments submitted to pathology. The base of the tumor was fulgurated. The erythematous patches of radiation cystitis were also cauterized. At this point, the bladder was drained and the scope was removed. Then, 10 mL of viscous Xylocaine was instilled within the urethra. A 97-FSFSEL silicone catheter was placed. The, 40 mg of mitomycin-C was instilled within the bladder and the catheter was plugged. B and O suppository was placed. The procedure was then terminated and the patient was transferred to the recovery room in stable condition.     ____________________________ Otelia Limes. Yves Dill,  MD mrw:lr D: 09/06/2014 14:11:16 ET T: 09/06/2014 14:16:42 ET JOB#: [id number]  cc: Otelia Limes. Yves Dill, MD, <Dictator> Royston Cowper MD ELECTRONICALLY SIGNED 09/06/2014 15:28

## 2015-04-08 NOTE — Consult Note (Signed)
PATIENT NAME:  Austin Contreras, Austin Contreras MR#:  [id number] DATE OF BIRTH:  04-14-1926  DATE OF CONSULTATION:  12/19/2013  REQUESTING PHYSICIAN:  Dr. Lavetta Nielsen.  CONSULTING PHYSICIAN:  Charna Archer, MD  REASON FOR CONSULT: Hematuria.  HISTORY OF PRESENT ILLNESS: Mr. Savage is a pleasant 81 year old gentleman with a history of BPH and gross hematuria, who is seen in consultation at the request of Dr. Lavetta Nielsen for evaluation and recommendations regarding hematuria. Mr. Duecker presented to the emergency room late last night/early this morning with new onset chest pain and hematuria. He states that his hematuria started yesterday. His daughter noted that he had blood all over the bathroom floor that seemed to be emanating around his catheter. He also had some mild nausea associated with this. He had to have his current catheter placed several days ago in the emergency room for urinary retention. He does have a history of BPH for which he has undergone a TURP In the past. He has no history of bladder or prostate cancer. He does have a history of kidney stones, and he had a large bladder stone which was surgically removed about 10 years ago. At this time, he is without chest pain. No nausea, vomiting, or flank pain. No dysuria. He has had a heart valve replacement and CABG in the past, but is currently only on baby aspirin.   PAST MEDICAL HISTORY:  1. BPH. 2. Coronary artery disease. 3. History of GI bleed. 4. History of bladder stone. 5. History of kidney stones. 6. History of chronic anemia. 7. Hypertension.  PAST SURGICAL HISTORY:  1. Bladder stone removal. 2. CABG. 3. Pacemaker placement. 4. Heart valve replacement.  5. TURP.  ALLERGIES:  1. FLU VACCINE.  2. PENICILLIN.  MEDICATIONS:  1. Aspirin 81 mg. 2. Percocet 5/25 q.6 p.r.n. pain. 3. Sotalol 80 mg b.i.d.  4. Metoprolol 25 mg p.o. b.i.d.  5. Lasix 40 mg daily. 6. Potassium 10 mEq daily.  SOCIAL HISTORY: The patient quit smoking cigarettes about  30 years ago. He smoked 1 pack a day for 30 years. He occasionally uses alcohol. He does not use any illicit drugs. He is here with his daughters.   FAMILY HISTORY: The patient denies any history of GU malignancy in the family.   REVIEW OF SYSTEMS: Ten point review of systems is negative except for pertinent positives and negatives noted in HPI.  PHYSICAL EXAMINATION:  VITAL SIGNS: Temperature 98.6, pulse 73, respirations 16, blood pressure 112/73, pulse ox 96% on room air.  GENERAL: Pleasant, Caucasian male lying down in bed in no acute distress.  HEENT: Normocephalic, atraumatic. Extraocular movements in intact. Oropharynx mucosa pink and moist.  NECK: Supple, trachea midline.  RESPIRATORY: breathing comfortably on  room air, nonlabored respirations. HEART:  Regular rate and rhythm. ABDOMEN: Soft, nondistended. He has mild suprapubic tenderness. He has no CVA tenderness.  GENITOURINARY: Reveals a circumcised penis with no lesions. Scrotum is within normal limits. There is a Foley catheter draining the bladder with a light brown urine with no clots within it.  EXTREMITIES: Warm and well perfused. NEUROLOGICAL: Cranial nerves II through XII grossly intact.  PSYCHOLOGICAL: Appropriate mood and affect and cooperative.   LABORATORIES: Laboratory studies were reviewed. They are notable for a creatinine  of 2.2 down from 2.4 in the emergency room. His hemoglobin is 11.1 from 11.7 from 12.7. Urinalysis shows many RBCs and white cells per high power field. It is negative nitrites and trace leukocyte esterase. There is trace bacteria.  PROCEDURE :  The patient's Foley catheter was irrigated with 200 mL of normal saline with no return of clot. The urine was yellow with the slightest hint of brown tinge to it, but no clots were returned.   IMAGING: The patient's CT  from 12/31 is personally reviewed. It shows somewhat small kidneys bilaterally. He has an about 1 cm stone in the lower pole of the left  kidney. There is no ureteral dilation. He does have what appears to be a small mass in the dome of the bladder anteriorly. His prostate is enlarged with a nodular appearance to it with perhaps a small clot in the prostatic urethra on the scan.   ASSESSMENT: An 81 year old male with a history of gross hematuria with possible mass on the CT scan, who is essentially draining clear yellow urine now.   PLAN: I do not think the patient needs any acute surgical intervention or a continuous bladder irrigation at this time. I suspect the change in his hemoglobin was from his acute bleeding, but this has since resolved. The patient definitely needs outpatient cystoscopy to evaluate this possible bladder mass. He does have a significant smoking history. He has an appointment with Dr. Yves Dill tomorrow at 1:30 and I recommended that he keep this appointment for potential cystoscopy at that time. The patient does not need any other further workup in house at this time and I am okay with him being discharged at the discretion of the primary team. I have discussed this with the patient as well as his daughters and he is in agreement and understanding. All their questions were answered to their satisfaction.    ____________________________ Charna Archer, MD jps:sg D: 12/19/2013 11:21:00 ET T: 12/19/2013 12:49:27 ET JOB#: [id number]  cc: Charna Archer, MD, <Dictator> Elwyn Lade [facility] MD ELECTRONICALLY SIGNED 12/24/2013 17:03

## 2015-05-31 ENCOUNTER — Inpatient Hospital Stay: Payer: Medicare Other

## 2015-05-31 ENCOUNTER — Inpatient Hospital Stay: Payer: Medicare Other | Attending: Family Medicine | Admitting: Oncology

## 2015-05-31 DIAGNOSIS — E538 Deficiency of other specified B group vitamins: Secondary | ICD-10-CM

## 2015-05-31 DIAGNOSIS — D472 Monoclonal gammopathy: Secondary | ICD-10-CM | POA: Insufficient documentation

## 2015-05-31 DIAGNOSIS — D509 Iron deficiency anemia, unspecified: Secondary | ICD-10-CM | POA: Diagnosis present

## 2015-05-31 DIAGNOSIS — Z79899 Other long term (current) drug therapy: Secondary | ICD-10-CM | POA: Insufficient documentation

## 2015-05-31 DIAGNOSIS — C679 Malignant neoplasm of bladder, unspecified: Secondary | ICD-10-CM | POA: Insufficient documentation

## 2015-05-31 LAB — CBC WITH DIFFERENTIAL/PLATELET
BASOS ABS: 0 10*3/uL (ref 0–0.1)
Basophils Relative: 1 %
EOS PCT: 6 %
Eosinophils Absolute: 0.3 10*3/uL (ref 0–0.7)
HEMATOCRIT: 36.9 % — AB (ref 40.0–52.0)
HEMOGLOBIN: 11.9 g/dL — AB (ref 13.0–18.0)
Lymphocytes Relative: 18 %
Lymphs Abs: 0.9 10*3/uL — ABNORMAL LOW (ref 1.0–3.6)
MCH: 29.6 pg (ref 26.0–34.0)
MCHC: 32.2 g/dL (ref 32.0–36.0)
MCV: 91.9 fL (ref 80.0–100.0)
MONO ABS: 0.7 10*3/uL (ref 0.2–1.0)
Monocytes Relative: 14 %
Neutro Abs: 3 10*3/uL (ref 1.4–6.5)
Neutrophils Relative %: 61 %
Platelets: 155 10*3/uL (ref 150–440)
RBC: 4.02 MIL/uL — ABNORMAL LOW (ref 4.40–5.90)
RDW: 15.2 % — AB (ref 11.5–14.5)
WBC: 4.9 10*3/uL (ref 3.8–10.6)

## 2015-05-31 MED ORDER — CYANOCOBALAMIN 1000 MCG/ML IJ SOLN
1000.0000 ug | Freq: Once | INTRAMUSCULAR | Status: AC
  Administered 2015-05-31: 1000 ug via INTRAMUSCULAR
  Filled 2015-05-31: qty 1

## 2015-06-01 LAB — PROTEIN ELECTROPHORESIS, SERUM
A/G Ratio: 1.1 (ref 0.7–1.7)
Albumin ELP: 3.6 g/dL (ref 2.9–4.4)
Alpha-1-Globulin: 0.2 g/dL (ref 0.0–0.4)
Alpha-2-Globulin: 0.8 g/dL (ref 0.4–1.0)
Beta Globulin: 1 g/dL (ref 0.7–1.3)
GAMMA GLOBULIN: 1.2 g/dL (ref 0.4–1.8)
GLOBULIN, TOTAL: 3.3 g/dL (ref 2.2–3.9)
M-SPIKE, %: 0.6 g/dL — AB
Total Protein ELP: 6.9 g/dL (ref 6.0–8.5)

## 2015-06-01 LAB — KAPPA/LAMBDA LIGHT CHAINS
Kappa free light chain: 161.34 mg/L — ABNORMAL HIGH (ref 3.30–19.40)
Kappa, lambda light chain ratio: 6.16 — ABNORMAL HIGH (ref 0.26–1.65)
Lambda free light chains: 26.19 mg/L (ref 5.71–26.30)

## 2015-08-02 ENCOUNTER — Inpatient Hospital Stay: Payer: Medicare Other

## 2015-08-02 ENCOUNTER — Ambulatory Visit: Payer: Self-pay | Admitting: Internal Medicine

## 2015-08-02 ENCOUNTER — Encounter: Payer: Self-pay | Admitting: Family Medicine

## 2015-08-02 ENCOUNTER — Inpatient Hospital Stay: Payer: Medicare Other | Attending: Family Medicine | Admitting: Family Medicine

## 2015-08-02 ENCOUNTER — Ambulatory Visit: Payer: Self-pay

## 2015-08-02 ENCOUNTER — Other Ambulatory Visit: Payer: Self-pay

## 2015-08-02 VITALS — BP 158/94 | HR 79 | Temp 98.3°F | Wt 169.6 lb

## 2015-08-02 DIAGNOSIS — Z923 Personal history of irradiation: Secondary | ICD-10-CM | POA: Diagnosis not present

## 2015-08-02 DIAGNOSIS — K219 Gastro-esophageal reflux disease without esophagitis: Secondary | ICD-10-CM | POA: Diagnosis not present

## 2015-08-02 DIAGNOSIS — C679 Malignant neoplasm of bladder, unspecified: Secondary | ICD-10-CM | POA: Diagnosis not present

## 2015-08-02 DIAGNOSIS — Z87891 Personal history of nicotine dependence: Secondary | ICD-10-CM | POA: Diagnosis not present

## 2015-08-02 DIAGNOSIS — I10 Essential (primary) hypertension: Secondary | ICD-10-CM | POA: Diagnosis not present

## 2015-08-02 DIAGNOSIS — D519 Vitamin B12 deficiency anemia, unspecified: Secondary | ICD-10-CM

## 2015-08-02 DIAGNOSIS — E538 Deficiency of other specified B group vitamins: Secondary | ICD-10-CM | POA: Diagnosis not present

## 2015-08-02 DIAGNOSIS — Z79899 Other long term (current) drug therapy: Secondary | ICD-10-CM

## 2015-08-02 HISTORY — DX: Vitamin B12 deficiency anemia, unspecified: D51.9

## 2015-08-02 LAB — CBC WITH DIFFERENTIAL/PLATELET
BASOS ABS: 0 10*3/uL (ref 0–0.1)
Basophils Relative: 1 %
Eosinophils Absolute: 0.3 10*3/uL (ref 0–0.7)
Eosinophils Relative: 6 %
HEMATOCRIT: 36.4 % — AB (ref 40.0–52.0)
HEMOGLOBIN: 12 g/dL — AB (ref 13.0–18.0)
LYMPHS PCT: 14 %
Lymphs Abs: 0.7 10*3/uL — ABNORMAL LOW (ref 1.0–3.6)
MCH: 30.3 pg (ref 26.0–34.0)
MCHC: 33 g/dL (ref 32.0–36.0)
MCV: 91.7 fL (ref 80.0–100.0)
Monocytes Absolute: 0.7 10*3/uL (ref 0.2–1.0)
Monocytes Relative: 13 %
NEUTROS ABS: 3.3 10*3/uL (ref 1.4–6.5)
Neutrophils Relative %: 66 %
Platelets: 159 10*3/uL (ref 150–440)
RBC: 3.97 MIL/uL — ABNORMAL LOW (ref 4.40–5.90)
RDW: 16.1 % — ABNORMAL HIGH (ref 11.5–14.5)
WBC: 5 10*3/uL (ref 3.8–10.6)

## 2015-08-02 LAB — VITAMIN B12: Vitamin B-12: 203 pg/mL (ref 180–914)

## 2015-08-02 MED ORDER — CYANOCOBALAMIN 1000 MCG/ML IJ SOLN
1000.0000 ug | Freq: Once | INTRAMUSCULAR | Status: AC
  Administered 2015-08-02: 1000 ug via INTRAMUSCULAR
  Filled 2015-08-02: qty 1

## 2015-08-02 NOTE — Progress Notes (Signed)
[place]  [phone]) [phone]  Fax:([phone]) [phone]     MCKENNA BORUFF DOB: 12-Mar-1926  MR#: [id number]  JEH#:[id number]  Patient Care Team: Dionisio David, MD as PCP - General (Cardiology)  CHIEF COMPLAINT:  Chief Complaint  Patient presents with  . Follow-up  . B12 Injection  . 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 Patient is followed by Dr. Yves Dill, status post transurethral resection of bladder tumor with instillation of mitomycin. Patient underwent radiation therapy but decided against chemotherapy.  INTERVAL HISTORY:  Patient is here for continued follow-up regarding B12 deficiency, anemia, and bladder cancer. He overall reports feeling fairly well but having some fatigue. Patient follows with Dr. Yves Dill on a regular basis for cystoscopy approximately every 3 months. The patient reports he believes he is due for one in the near future but his appointments are written at home. He does have some intermittent hematuria and only lasts for several hours and then resolves, no complaints of pain or discomfort. His last episode of intermittent hematuria was 2 weeks ago and completely resolved over the matter of hours.  REVIEW OF SYSTEMS:   Review of Systems  Constitutional: Positive for malaise/fatigue. Negative for fever, chills, weight loss and diaphoresis.  HENT: Negative for congestion, ear discharge, ear pain, hearing loss, nosebleeds, sore throat and tinnitus.   Eyes: Negative for blurred vision, double vision, photophobia, pain, discharge and redness.  Respiratory: Negative for cough, hemoptysis, sputum production, shortness of breath, wheezing and stridor.   Cardiovascular: Negative for chest pain, palpitations, orthopnea, claudication, leg swelling and PND.  Gastrointestinal: Negative for heartburn, nausea, vomiting, abdominal pain, diarrhea, constipation, blood in stool and melena.    Genitourinary: Positive for hematuria.       Hematuria is intermittent and lasts less than 1 day, following with Dr. Yves Dill  Musculoskeletal: Negative.   Skin: Negative.   Neurological: Negative for dizziness, tingling, focal weakness, seizures and headaches.  Endo/Heme/Allergies: Does not bruise/bleed easily.  Psychiatric/Behavioral: Negative for depression. The patient is not nervous/anxious and does not have insomnia.     As per HPI. Otherwise, a complete review of systems is negatve.  ONCOLOGY HISTORY:   Bladder cancer   12/01/2013 Initial Diagnosis Bladder cancer    PAST MEDICAL HISTORY: Past Medical History  Diagnosis Date  . B12 deficiency anemia 08/02/2015  . Bladder cancer   . GERD (gastroesophageal reflux disease)   . Hypertension     PAST SURGICAL HISTORY: Past Surgical History  Procedure Laterality Date  . Appendectomy    . Coronary artery bypass graft    . Cataracts      FAMILY HISTORY Family History  Problem Relation Age of Onset  . Cancer Sister     GYNECOLOGIC HISTORY:  No LMP for male patient.     ADVANCED DIRECTIVES:    HEALTH MAINTENANCE: Social History  Substance Use Topics  . Smoking status: Former Smoker    Types: Cigarettes  . Smokeless tobacco: Never Used  . Alcohol Use: No     Colonoscopy:  PAP:  Bone density:  Lipid panel:  Allergies  Allergen Reactions  . Penicillins Rash    Current Outpatient Prescriptions  Medication Sig Dispense Refill  . aspirin 81 MG tablet Take 81 mg by mouth daily.    . enalapril (VASOTEC) 5 MG tablet     . hydrocortisone (ANUSOL-HC) 25 MG suppository Place rectally.    Marland Kitchen MYRBETRIQ 25 MG TB24 tablet     .  omeprazole (PRILOSEC) 40 MG capsule     . simvastatin (ZOCOR) 10 MG tablet Take by mouth.    . sotalol (BETAPACE) 80 MG tablet Take by mouth.    . tapentadol (NUCYNTA) 50 MG TABS tablet Take by mouth.    . levofloxacin (LEVAQUIN) 500 MG tablet      No current facility-administered  medications for this visit.    OBJECTIVE: BP 158/94 mmHg  Pulse 79  Temp(Src) 98.3 F (36.8 C) (Tympanic)  Wt 169 lb 10.3 oz (76.95 kg)   There is no height on file to calculate BMI.    ECOG FS:1 - Symptomatic but completely ambulatory  General: Well-developed, well-nourished, no acute distress. Eyes: Pink conjunctiva, anicteric sclera. HEENT: Normocephalic, moist mucous membranes, clear oropharnyx. Lungs: Clear to auscultation bilaterally. Heart: Paced. No rubs, murmurs, or gallops. Abdomen: Soft, nontender, nondistended. No organomegaly noted, normoactive bowel sounds. Musculoskeletal: No edema, cyanosis, or clubbing. Neuro: Alert, answering all questions appropriately. Cranial nerves grossly intact. Skin: No rashes or petechiae noted. Psych: Normal affect. Lymphatics: No cervical, calvicular, axillary or inguinal LAD.   LAB RESULTS:  Office Visit on 08/02/2015  Component Date Value Ref Range Status  . WBC 08/02/2015 5.0  3.8 - 10.6 K/uL Final  . RBC 08/02/2015 3.97* 4.40 - 5.90 MIL/uL Final  . Hemoglobin 08/02/2015 12.0* 13.0 - 18.0 g/dL Final  . HCT 08/02/2015 36.4* 40.0 - 52.0 % Final  . MCV 08/02/2015 91.7  80.0 - 100.0 fL Final  . MCH 08/02/2015 30.3  26.0 - 34.0 pg Final  . MCHC 08/02/2015 33.0  32.0 - 36.0 g/dL Final  . RDW 08/02/2015 16.1* 11.5 - 14.5 % Final  . Platelets 08/02/2015 159  150 - 440 K/uL Final  . Neutrophils Relative % 08/02/2015 66   Final  . Neutro Abs 08/02/2015 3.3  1.4 - 6.5 K/uL Final  . Lymphocytes Relative 08/02/2015 14   Final  . Lymphs Abs 08/02/2015 0.7* 1.0 - 3.6 K/uL Final  . Monocytes Relative 08/02/2015 13   Final  . Monocytes Absolute 08/02/2015 0.7  0.2 - 1.0 K/uL Final  . Eosinophils Relative 08/02/2015 6   Final  . Eosinophils Absolute 08/02/2015 0.3  0 - 0.7 K/uL Final  . Basophils Relative 08/02/2015 1   Final  . Basophils Absolute 08/02/2015 0.0  0 - 0.1 K/uL Final    STUDIES: No results found.  ASSESSMENT:   B12  deficiency   bladder cancer  PLAN:    1. B12 deficiency. Patient reports feeling increasingly fatigued. We'll proceed B12 injection 1000 g.  We'll continue monthly B12 injections until next follow-up.  2. Bladder cancer.  Patient with high-grade transitional cell carcinoma with lymphovascular invasion as well as invasion of the side of epithelial connective tissue. Patient is status post TURBT and XRT.  Previously decided not to pursue treatment with platinum  Chemotherapy.He continues with regular follow-up with Dr. Yves Dill who performed cystoscopies every 3 months or so. Most recent cystoscopy was in  April or May per patient. He believes he is due for another evaluations to that has appointments written at home.   We will have patient return monthly for B12 injections and again in 4 months for further follow-up with provider. Patient advised to keep all follow-up with Dr. Yves Dill as scheduled.  Patient expressed understanding and was in agreement with this plan. He also understands that He can call clinic at any time with any questions, concerns, or complaints.   Dr. Oliva Bustard was available for consultation and  review of plan of care for this patient.   Evlyn Kanner, NP   08/02/2015 10:14 AM

## 2015-08-17 DIAGNOSIS — D510 Vitamin B12 deficiency anemia due to intrinsic factor deficiency: Secondary | ICD-10-CM | POA: Insufficient documentation

## 2015-08-17 DIAGNOSIS — C679 Malignant neoplasm of bladder, unspecified: Secondary | ICD-10-CM | POA: Insufficient documentation

## 2015-09-01 ENCOUNTER — Inpatient Hospital Stay: Payer: Medicare Other | Attending: Family Medicine

## 2015-09-01 ENCOUNTER — Other Ambulatory Visit: Payer: Self-pay | Admitting: Family Medicine

## 2015-09-01 DIAGNOSIS — C679 Malignant neoplasm of bladder, unspecified: Secondary | ICD-10-CM | POA: Insufficient documentation

## 2015-09-01 DIAGNOSIS — E538 Deficiency of other specified B group vitamins: Secondary | ICD-10-CM | POA: Insufficient documentation

## 2015-09-01 DIAGNOSIS — Z79899 Other long term (current) drug therapy: Secondary | ICD-10-CM | POA: Insufficient documentation

## 2015-09-01 DIAGNOSIS — D519 Vitamin B12 deficiency anemia, unspecified: Secondary | ICD-10-CM

## 2015-09-01 MED ORDER — CYANOCOBALAMIN 1000 MCG/ML IJ SOLN
1000.0000 ug | Freq: Once | INTRAMUSCULAR | Status: AC
  Administered 2015-09-01: 1000 ug via SUBCUTANEOUS
  Filled 2015-09-01: qty 1

## 2015-10-02 ENCOUNTER — Inpatient Hospital Stay: Payer: Medicare Other | Attending: Family Medicine

## 2015-10-02 ENCOUNTER — Other Ambulatory Visit: Payer: Self-pay | Admitting: Family Medicine

## 2015-10-02 DIAGNOSIS — Z79899 Other long term (current) drug therapy: Secondary | ICD-10-CM | POA: Insufficient documentation

## 2015-10-02 DIAGNOSIS — E538 Deficiency of other specified B group vitamins: Secondary | ICD-10-CM | POA: Diagnosis not present

## 2015-10-02 DIAGNOSIS — C679 Malignant neoplasm of bladder, unspecified: Secondary | ICD-10-CM | POA: Insufficient documentation

## 2015-10-02 DIAGNOSIS — D519 Vitamin B12 deficiency anemia, unspecified: Secondary | ICD-10-CM

## 2015-10-02 MED ORDER — CYANOCOBALAMIN 1000 MCG/ML IJ SOLN
1000.0000 ug | Freq: Once | INTRAMUSCULAR | Status: AC
  Administered 2015-10-02: 1000 ug via INTRAMUSCULAR
  Filled 2015-10-02: qty 1

## 2015-10-10 ENCOUNTER — Encounter
Admission: RE | Admit: 2015-10-10 | Discharge: 2015-10-10 | Disposition: A | Discharge status: Home / Self Care | Payer: Medicare Other | Source: Ambulatory Visit | Attending: Urology | Admitting: Urology

## 2015-10-10 DIAGNOSIS — C679 Malignant neoplasm of bladder, unspecified: Secondary | ICD-10-CM | POA: Insufficient documentation

## 2015-10-10 DIAGNOSIS — Z01818 Encounter for other preprocedural examination: Secondary | ICD-10-CM | POA: Insufficient documentation

## 2015-10-10 HISTORY — DX: Atherosclerotic heart disease of native coronary artery without angina pectoris: I25.10

## 2015-10-10 HISTORY — DX: Hyperlipidemia, unspecified: E78.5

## 2015-10-10 HISTORY — DX: Other specified postprocedural states: R11.2

## 2015-10-10 HISTORY — DX: Other specified postprocedural states: Z98.890

## 2015-10-10 LAB — CBC
HEMATOCRIT: 38.3 % — AB (ref 40.0–52.0)
HEMOGLOBIN: 12.5 g/dL — AB (ref 13.0–18.0)
MCH: 30.3 pg (ref 26.0–34.0)
MCHC: 32.5 g/dL (ref 32.0–36.0)
MCV: 93.1 fL (ref 80.0–100.0)
Platelets: 161 10*3/uL (ref 150–440)
RBC: 4.11 MIL/uL — ABNORMAL LOW (ref 4.40–5.90)
RDW: 15.3 % — AB (ref 11.5–14.5)
WBC: 4.8 10*3/uL (ref 3.8–10.6)

## 2015-10-10 LAB — BASIC METABOLIC PANEL
Anion gap: 6 (ref 5–15)
BUN: 31 mg/dL — ABNORMAL HIGH (ref 6–20)
CALCIUM: 9.1 mg/dL (ref 8.9–10.3)
CHLORIDE: 110 mmol/L (ref 101–111)
CO2: 26 mmol/L (ref 22–32)
Creatinine, Ser: 1.74 mg/dL — ABNORMAL HIGH (ref 0.61–1.24)
GFR calc non Af Amer: 34 mL/min — ABNORMAL LOW (ref >60)
GFR, EST AFRICAN AMERICAN: 39 mL/min — AB (ref >60)
GLUCOSE: 102 mg/dL — AB (ref 65–99)
Potassium: 4.9 mmol/L (ref 3.5–5.1)
Sodium: 142 mmol/L (ref 135–145)

## 2015-10-10 NOTE — Patient Instructions (Signed)
  Your procedure is scheduled on: 11/1 16 Report to Day Surgery. To find out your arrival time please call [phone] between 1PM - 3PM on 10/16/2015  Remember: Instructions that are not followed completely may result in serious medical risk, up to and including death, or upon the discretion of your surgeon and anesthesiologist your surgery may need to be rescheduled.    _x___ 1. Do not eat food or drink liquids after midnight. No gum chewing or hard candies.     __x__ 2. No Alcohol for 24 hours before or after surgery.   ___ 3. Bring all medications with you on the day of surgery if instructed.    __x2__ 4. Notify your doctor if there is any change in your medical condition     (cold, fever, infections).     Do not wear jewelry, make-up, hairpins, clips or nail polish.  Do not wear lotions, powders, or perfumes. You may wear deodorant.  Do not shave 48 hours prior to surgery. Men may shave face and neck.  Do not bring valuables to the hospital.    [facility] is not responsible for any belongings or valuables.               Contacts, dentures or bridgework may not be worn into surgery.  Leave your suitcase in the car. After surgery it may be brought to your room.  For patients admitted to the hospital, discharge time is determined by your                treatment team.   Patients discharged the day of surgery will not be allowed to drive home.   Please read over the following fact sheets that you were given:   Surgical Site Infection Prevention   ____ Take these medicines the morning of surgery with A SIP OF WATER:    1. Enalapril   2. Sotalol  3.   4.  5.  6.  ____ Fleet Enema (as directed)   ____ Use CHG Soap as directed  ____ Use inhalers on the day of surgery  ____ Stop metformin 2 days prior to surgery    ____ Take 1/2 of usual insulin dose the night before surgery and none on the morning of surgery.   ____ Stop Coumadin/Plavix/aspirin on   ____ Stop  Anti-inflammatories on    ____ Stop supplements until after surgery.    ____ Bring C-Pap to the hospital.

## 2015-10-12 NOTE — H&P (Signed)
NAMEMASSON, NALEPA                 ACCOUNT NO.:  [id number]  MEDICAL RECORD NO.:  [id number]  LOCATION:  [facility]                        FACILITY:  [facility]  PHYSICIAN:  Maryan Puls          DATE OF BIRTH:  October 06, 1926  DATE OF ADMISSION:  10/17/2015 DATE OF DISCHARGE:                            HISTORY AND PHYSICAL   Same-day surgery scheduled November 1st.  CHIEF COMPLAINT:  Bladder cancer.  HISTORY OF PRESENT ILLNESS:  Mr. Stuckey is an 81 year old Caucasian male with a history of invasive bladder cancer who underwent bladder-sparing treatment using external beam radiation therapy, which was completed in July 2015.  He has a history of radiation cystitis and hematuria.  He underwent surveillance cystoscopy on October 18th and was found to have a 5 x 5 mm tumor on the left side of the prostate proximal to the left ureteral orifice.  The patient also had a scarred area consistent with old tumor resection just lateral to this area.  He comes in now for transurethral resection of bladder tumor.  PAST MEDICAL HISTORY:  Allergies:  The patient was allergic to PENICILLIN.  CURRENT MEDICATIONS:  Included Lasix, mirabegron, Vasotec, Anusol suppositories, simvastatin, and Betapace.  PREVIOUS SURGICAL PROCEDURES:  Included photovaporization of the prostate with GreenLight Laser in 2009, pacemaker placement, aortic valve replacement, coronary artery bypass graft in 2013, ureteroscopic ureterolithotomy with holmium laser lithotripsy in 2013, ESWL in 2013, cystoscopy with stent retrieval in 2014, transurethral resection of bladder tumor in March 2015 and September 2015.  PAST AND CURRENT MEDICAL HISTORY: 1. Coronary artery disease. 2. Aortic valve disease. 3. GERD. 4. History of cardiac arrhythmias status post pacemaker placement. 5. Hypertension.  REVIEW OF SYSTEMS:  The patient denied chest pain, shortness of breath, diabetes, or stroke.  PHYSICAL EXAMINATION:  GENERAL:  A  well-nourished white male, in no acute distress. HEENT:  Sclerae are clear. NECK:  Supple.  No palpable cervical adenopathy. LUNGS:  Clear to auscultation. CARDIOVASCULAR:  Regular in rate without audible murmurs. ABDOMEN:  Soft, nontender abdomen. GU:  Circumcised testes.  Smooth.  Nontender. RECTAL:  40 g, smooth, nontender prostate. NEUROMUSCULAR:  Alert and oriented x3.  IMPRESSION: 1. Recurrent bladder cancer. 2. History of muscle invasive bladder cancer status post external beam     radiation therapy. 3. Radiation cystitis.  PLAN:  Transurethral resection of the bladder tumor.          ______________________________ Maryan Puls     MW/MEDQ  D:  10/11/2015  T:  10/12/2015  Job:  [id number]  cc:   Armstead Peaks, MD Frazier Richards, MD

## 2015-10-17 ENCOUNTER — Ambulatory Visit: Payer: Medicare Other | Admitting: Anesthesiology

## 2015-10-17 ENCOUNTER — Encounter
Admission: RE | Disposition: A | Discharge status: Home / Self Care | Payer: Self-pay | Source: Ambulatory Visit | Attending: Urology

## 2015-10-17 ENCOUNTER — Ambulatory Visit
Admission: RE | Admit: 2015-10-17 | Discharge: 2015-10-17 | Disposition: A | Discharge status: Home / Self Care | Payer: Medicare Other | Source: Ambulatory Visit | Attending: Urology | Admitting: Urology

## 2015-10-17 ENCOUNTER — Encounter: Payer: Self-pay | Admitting: Urology

## 2015-10-17 DIAGNOSIS — C679 Malignant neoplasm of bladder, unspecified: Secondary | ICD-10-CM | POA: Diagnosis present

## 2015-10-17 DIAGNOSIS — I358 Other nonrheumatic aortic valve disorders: Secondary | ICD-10-CM | POA: Insufficient documentation

## 2015-10-17 DIAGNOSIS — Z950 Presence of cardiac pacemaker: Secondary | ICD-10-CM | POA: Diagnosis not present

## 2015-10-17 DIAGNOSIS — Z8551 Personal history of malignant neoplasm of bladder: Secondary | ICD-10-CM | POA: Diagnosis not present

## 2015-10-17 DIAGNOSIS — I119 Hypertensive heart disease without heart failure: Secondary | ICD-10-CM | POA: Diagnosis not present

## 2015-10-17 DIAGNOSIS — Z923 Personal history of irradiation: Secondary | ICD-10-CM | POA: Insufficient documentation

## 2015-10-17 DIAGNOSIS — D090 Carcinoma in situ of bladder: Secondary | ICD-10-CM | POA: Diagnosis not present

## 2015-10-17 DIAGNOSIS — Z880 Allergy status to penicillin: Secondary | ICD-10-CM | POA: Diagnosis not present

## 2015-10-17 DIAGNOSIS — N3090 Cystitis, unspecified without hematuria: Secondary | ICD-10-CM | POA: Diagnosis not present

## 2015-10-17 DIAGNOSIS — Z79899 Other long term (current) drug therapy: Secondary | ICD-10-CM | POA: Diagnosis not present

## 2015-10-17 DIAGNOSIS — I2510 Atherosclerotic heart disease of native coronary artery without angina pectoris: Secondary | ICD-10-CM | POA: Insufficient documentation

## 2015-10-17 DIAGNOSIS — C674 Malignant neoplasm of posterior wall of bladder: Secondary | ICD-10-CM

## 2015-10-17 DIAGNOSIS — K219 Gastro-esophageal reflux disease without esophagitis: Secondary | ICD-10-CM | POA: Insufficient documentation

## 2015-10-17 HISTORY — PX: TRANSURETHRAL RESECTION OF BLADDER TUMOR: SHX2575

## 2015-10-17 SURGERY — TURBT (TRANSURETHRAL RESECTION OF BLADDER TUMOR)
Anesthesia: General

## 2015-10-17 MED ORDER — FAMOTIDINE 20 MG PO TABS
20.0000 mg | ORAL_TABLET | Freq: Once | ORAL | Status: AC
  Administered 2015-10-17: 20 mg via ORAL

## 2015-10-17 MED ORDER — LEVOFLOXACIN IN D5W 500 MG/100ML IV SOLN
500.0000 mg | Freq: Once | INTRAVENOUS | Status: AC
  Administered 2015-10-17: 500 mg via INTRAVENOUS

## 2015-10-17 MED ORDER — ACETAMINOPHEN-CODEINE #3 300-30 MG PO TABS: 1.0000 | ORAL_TABLET | ORAL | Status: DC | PRN

## 2015-10-17 MED ORDER — LIDOCAINE HCL (CARDIAC) 20 MG/ML IV SOLN
INTRAVENOUS | Status: DC | PRN
  Administered 2015-10-17: 60 mg via INTRAVENOUS

## 2015-10-17 MED ORDER — MITOMYCIN CHEMO FOR BLADDER INSTILLATION 40 MG
INTRAVENOUS | Status: DC | PRN
  Administered 2015-10-17: 40 mg via INTRAVESICAL

## 2015-10-17 MED ORDER — PHENYLEPHRINE HCL 10 MG/ML IJ SOLN
INTRAMUSCULAR | Status: DC | PRN
  Administered 2015-10-17: 50 ug via INTRAVENOUS

## 2015-10-17 MED ORDER — SOTALOL HCL 80 MG PO TABS
80.0000 mg | ORAL_TABLET | Freq: Once | ORAL | Status: AC
  Administered 2015-10-17: 80 mg via ORAL
  Filled 2015-10-17: qty 1

## 2015-10-17 MED ORDER — BELLADONNA ALKALOIDS-OPIUM 16.2-60 MG RE SUPP
RECTAL | Status: AC
  Filled 2015-10-17: qty 1

## 2015-10-17 MED ORDER — LEVOFLOXACIN 500 MG PO TABS: 500.0000 mg | ORAL_TABLET | Freq: Every day | ORAL | Status: DC

## 2015-10-17 MED ORDER — SODIUM CHLORIDE 0.9 % IR SOLN
Status: DC | PRN
  Administered 2015-10-17: 5100 mL via INTRAVESICAL

## 2015-10-17 MED ORDER — DOCUSATE SODIUM 100 MG PO CAPS: 200.0000 mg | ORAL_CAPSULE | Freq: Two times a day (BID) | ORAL | Status: DC

## 2015-10-17 MED ORDER — FENTANYL CITRATE (PF) 100 MCG/2ML IJ SOLN
INTRAMUSCULAR | Status: AC
  Administered 2015-10-17: 25 ug via INTRAVENOUS
  Filled 2015-10-17: qty 2

## 2015-10-17 MED ORDER — ACETAMINOPHEN-CODEINE #3 300-30 MG PO TABS
ORAL_TABLET | ORAL | Status: AC
  Administered 2015-10-17: 1 via ORAL
  Filled 2015-10-17: qty 1

## 2015-10-17 MED ORDER — FENTANYL CITRATE (PF) 100 MCG/2ML IJ SOLN
INTRAMUSCULAR | Status: DC | PRN
  Administered 2015-10-17 (×2): 25 ug via INTRAVENOUS

## 2015-10-17 MED ORDER — HYOSCYAMINE SULFATE 0.125 MG SL SUBL: 0.1250 mg | SUBLINGUAL_TABLET | SUBLINGUAL | Status: DC | PRN

## 2015-10-17 MED ORDER — BELLADONNA ALKALOIDS-OPIUM 16.2-60 MG RE SUPP
RECTAL | Status: DC | PRN
  Administered 2015-10-17: 1 via RECTAL

## 2015-10-17 MED ORDER — ONDANSETRON HCL 4 MG/2ML IJ SOLN: 4.0000 mg | Freq: Once | INTRAMUSCULAR | Status: DC | PRN

## 2015-10-17 MED ORDER — ONDANSETRON HCL 4 MG/2ML IJ SOLN
INTRAMUSCULAR | Status: DC | PRN
  Administered 2015-10-17: 4 mg via INTRAVENOUS

## 2015-10-17 MED ORDER — ONDANSETRON 8 MG PO TBDP: 8.0000 mg | ORAL_TABLET | Freq: Four times a day (QID) | ORAL | Status: DC | PRN

## 2015-10-17 MED ORDER — PROPOFOL 10 MG/ML IV BOLUS
INTRAVENOUS | Status: DC | PRN
  Administered 2015-10-17: 120 mg via INTRAVENOUS
  Administered 2015-10-17: 60 mg via INTRAVENOUS

## 2015-10-17 MED ORDER — LACTATED RINGERS IV SOLN
INTRAVENOUS | Status: DC
  Administered 2015-10-17: 11:00:00 via INTRAVENOUS

## 2015-10-17 MED ORDER — LEVOFLOXACIN IN D5W 500 MG/100ML IV SOLN
INTRAVENOUS | Status: AC
  Administered 2015-10-17: 500 mg via INTRAVENOUS
  Filled 2015-10-17: qty 100

## 2015-10-17 MED ORDER — FAMOTIDINE 20 MG PO TABS
ORAL_TABLET | ORAL | Status: AC
  Administered 2015-10-17: 20 mg via ORAL
  Filled 2015-10-17: qty 1

## 2015-10-17 MED ORDER — LIDOCAINE HCL 2 % EX GEL
CUTANEOUS | Status: AC
  Filled 2015-10-17: qty 10

## 2015-10-17 MED ORDER — DEXAMETHASONE SODIUM PHOSPHATE 10 MG/ML IJ SOLN
INTRAMUSCULAR | Status: DC | PRN
  Administered 2015-10-17: 5 mg via INTRAVENOUS

## 2015-10-17 MED ORDER — ACETAMINOPHEN-CODEINE #3 300-30 MG PO TABS
1.0000 | ORAL_TABLET | ORAL | Status: DC | PRN
  Administered 2015-10-17: 1 via ORAL

## 2015-10-17 MED ORDER — FENTANYL CITRATE (PF) 100 MCG/2ML IJ SOLN
25.0000 ug | INTRAMUSCULAR | Status: DC | PRN
  Administered 2015-10-17 (×4): 25 ug via INTRAVENOUS

## 2015-10-17 SURGICAL SUPPLY — 23 items
BAG DRAIN CYSTO-URO LG1000N (MISCELLANEOUS) IMPLANT
BAG URO DRAIN 2000ML W/SPOUT (MISCELLANEOUS) ×3 IMPLANT
CATH FOLEY 2WAY  5CC 20FR SIL (CATHETERS) ×2
CATH FOLEY 2WAY 5CC 20FR SIL (CATHETERS) ×1 IMPLANT
ELECT RESECT LOOP CUT 26F (MISCELLANEOUS) ×3 IMPLANT
ELECT RESECT POWERBALL 24F (MISCELLANEOUS) ×3 IMPLANT
GLOVE BIO SURGEON STRL SZ7 (GLOVE) ×6 IMPLANT
GLOVE BIO SURGEON STRL SZ7.5 (GLOVE) ×3 IMPLANT
GOWN STRL REUS W/ TWL LRG LVL4 (GOWN DISPOSABLE) ×1 IMPLANT
GOWN STRL REUS W/ TWL XL LVL3 (GOWN DISPOSABLE) ×1 IMPLANT
GOWN STRL REUS W/TWL LRG LVL4 (GOWN DISPOSABLE) ×2
GOWN STRL REUS W/TWL XL LVL3 (GOWN DISPOSABLE) ×2
KIT RM TURNOVER CYSTO AR (KITS) ×3 IMPLANT
PACK CYSTO AR (MISCELLANEOUS) ×3 IMPLANT
PAD GROUND ADULT SPLIT (MISCELLANEOUS) ×3 IMPLANT
PREP PVP WINGED SPONGE (MISCELLANEOUS) ×3 IMPLANT
RESECTOSCOPE LOOP ×3 IMPLANT
SET IRRIG Y TYPE TUR BLADDER L (SET/KITS/TRAYS/PACK) ×3 IMPLANT
SOL .9 NS 3000ML IRR  AL (IV SOLUTION) ×6
SOL .9 NS 3000ML IRR UROMATIC (IV SOLUTION) ×3 IMPLANT
SYRINGE IRR TOOMEY STRL 70CC (SYRINGE) ×3 IMPLANT
WATER STERILE IRR 1000ML POUR (IV SOLUTION) ×3 IMPLANT
WATER STERILE IRR 3000ML UROMA (IV SOLUTION) IMPLANT

## 2015-10-17 NOTE — Anesthesia Postprocedure Evaluation (Signed)
  Anesthesia Post-op Note  Patient: Austin Contreras  Procedure(s) Performed: Procedure(s): TRANSURETHRAL RESECTION OF BLADDER TUMOR (TURBT) (N/A)  Anesthesia type:General  Patient location: PACU  Post pain: Pain level controlled  Post assessment: Post-op Vital signs reviewed, Patient's Cardiovascular Status Stable, Respiratory Function Stable, Patent Airway and No signs of Nausea or vomiting  Post vital signs: Reviewed and stable  Last Vitals:  Filed Vitals:   10/17/15 1412  BP: 145/72  Pulse: 70  Temp:   Resp: 16    Level of consciousness: awake, alert  and patient cooperative  Complications: No apparent anesthesia complications

## 2015-10-17 NOTE — Transfer of Care (Signed)
Immediate Anesthesia Transfer of Care Note  Patient: Austin Contreras  Procedure(s) Performed: Procedure(s): TRANSURETHRAL RESECTION OF BLADDER TUMOR (TURBT) (N/A)  Patient Location: PACU  Anesthesia Type:General  Level of Consciousness: awake and patient cooperative  Airway & Oxygen Therapy: Patient Spontanous Breathing and Patient connected to face mask oxygen  Post-op Assessment: Report given to RN and Post -op Vital signs reviewed and stable  Post vital signs: Reviewed and stable  Last Vitals:  Filed Vitals:   10/17/15 1207  BP: 132/81  Pulse: 76  Temp: 37 C  Resp: 16    Complications: No apparent anesthesia complications

## 2015-10-17 NOTE — Op Note (Signed)
Preoperative diagnosis: Bladder cancer Postoperative diagnosis: Same  Procedure: 1. Transurethral resection of bladder tumor                      2. Instillation of mitomycin-C into the bladder                      3. Foley catheter placement   Surgeon: Otelia Limes. Yves Dill MD, FACS Anesthesia: Gen.  Indications:See the history and physical. After informed consent the above procedure(s) were requested     Technique and findings: After adequate general anesthesia had been obtained the patient was placed into dorsal lithotomy position and the perineum was prepped and draped in the usual fashion. The resectoscope was coupled to the camera and advanced into the bladder. Bladder was thoroughly inspected. Patient had several areas of flat tumor along the posterior wall as well as scarring. Bladder was noted to be small capacity. Both ureteral orifices were identified and had clear reflux. At this point the tumor areas were resected and fragments submitted to pathology. The resection sites were cauterized. At this point the resectoscope was removed. 10 cc of viscous Xylocaine was instilled within the urethra and the bladder. A 20 French silicone catheter was placed and irrigated until clear. 40 mg of mitomycin-C was instilled within the bladder and the catheter was plugged. A B&O suppository was placed. The procedure was then terminated and the patient transferred to the recovery room in stable condition.

## 2015-10-17 NOTE — H&P (Signed)
Date of Initial H&P: 10/11/15  History reviewed, patient examined, no change in status, stable for surgery.

## 2015-10-17 NOTE — Discharge Instructions (Addendum)
AMBULATORY SURGERY  DISCHARGE INSTRUCTIONS   1) The drugs that you were given will stay in your system until tomorrow so for the next 24 hours you should not:  A) Drive an automobile B) Make any legal decisions C) Drink any alcoholic beverage   2) You may resume regular meals tomorrow.  Today it is better to start with liquids and gradually work up to solid foods.  You may eat anything you prefer, but it is better to start with liquids, then soup and crackers, and gradually work up to solid foods.   3) Please notify your doctor immediately if you have any unusual bleeding, trouble breathing, redness and pain at the surgery site, drainage, fever, or pain not relieved by medication.    4) Additional Instructions:        Please contact your physician with any problems or Same Day Surgery at [phone], Monday through Friday 6 am to 4 pm, or [place] [facility] number at [phone]. Bladder Cancer Bladder cancer is an abnormal growth of tissue in your bladder. Your bladder is the balloon-like sac in your pelvis. It collects and stores urine that comes from the kidneys through the ureters. The bladder wall is made of layers. If cancer spreads into these layers and through the wall of the bladder, it becomes more difficult to treat.  There are four stages of bladder cancer:  Stage I. Cancer at this stage occurs in the bladder's inner lining but has not invaded the muscular bladder wall.  Stage II. At this stage, cancer has invaded the bladder wall but is still confined to the bladder.  Stage III. By this stage, the cancer cells have spread through the bladder wall to surrounding tissue. They may also have spread to the prostate in men or the uterus or vagina in women.  Stage IV. By this stage, cancer cells may have spread to the lymph nodes and other organs, such as your lungs, bones, or liver. RISK FACTORS Although the cause of bladder cancer is not known, the  following risk factors can increase your chances of getting bladder cancer:   Smoking.   Occupational exposures, such as rubber, leather, textile, dyes, chemicals, and paint.  Being white.  Age.   Being male.   Having chronic bladder inflammation.   Having a bladder cancer history.   Having a family history of bladder cancer (heredity).   Having had chemotherapy or radiation therapy to the pelvis.   Being exposed to arsenic.  SYMPTOMS   Blood in the urine.   Pain with urination.   Frequent bladder or urine infections.  Increase in urgency and frequency of urination. DIAGNOSIS  Your health care provider may suspect bladder cancer based on your description of urinary symptoms or based on the finding of blood or infection in the urine (especially if this has recurred several times). Other tests or procedures that may be performed include:   A narrow tube being inserted into your bladder through your urethra (cystoscopy) in order to view the lining of your bladder for tumors.   A biopsy to sample the tumor to see if cancer is present.  If cancer is present, it will then be staged to determine its severity and extent. It is important to know how deeply into the bladder wall the cancer has grown and whether the cancer has spread to any other parts of your body. Staging may require blood tests or special scans such as a CT scan, MRI, bone scan,  or chest X-ray.  TREATMENT  Once your cancer has been diagnosed and staged, you should discuss a treatment plan with your health care provider. Based on the stage of the cancer, one treatment or a combination of treatments may be recommended. The most common forms of treatment are:  1. Surgery. Procedures that may be done include transurethral resection and cystectomy. 2. Radiation therapy. This is infrequently used to treat bladder cancer.  3. Chemotherapy. During this treatment, drugs are used to kill cancer  cells. 4. Immunotherapy. This is usually administered directly into the bladder. HOME CARE INSTRUCTIONS  Take medicines only as directed by your health care provider.   Maintain a healthy diet.   Consider joining a support group. This may help you learn to cope with the stress of having bladder cancer.   Seek advice to help you manage treatment side effects.   Keep all follow-up visits as directed by your health care provider.   Inform your cancer specialist if you are admitted to the hospital.  [place] IF:  There is blood in your urine.  You have symptoms of a urinary tract infection. These include:  Tiredness.  Shakiness.  Weakness.  Muscle aches.  Abdominal pain.  Frequent and intense urge to urinate (in young women).  Burning feeling in the bladder or urethra during urination (in young women). SEEK IMMEDIATE MEDICAL CARE IF:  You are unable to urinate.   This information is not intended to replace advice given to you by your health care provider. Make sure you discuss any questions you have with your health care provider.   Document Released: 12/05/2003 Document Revised: 12/23/2014 Document Reviewed: 05/25/2013 Elsevier Interactive Patient Education 2016 [employer]  Transurethral Resection, Bladder Tumor A cancerous growth (tumor) can develop on the inside wall of the bladder. The bladder is the organ that holds urine. One way to remove the tumor is a procedure called a transurethral resection. The tumor is removed (resected) through the tube that carries urine from the bladder out of the body (urethra). No cuts (incisions) are made in the skin. Instead, the procedure is done through a thin telescope, called a resectoscope. Attached to it is a light and usually a tiny camera. The resectoscope is put into the urethra. In men, the urethra opens at the end of the penis. In women, it opens just above the vagina.  A transurethral resection is usually  used to remove tumors that have not gotten too big or too deep. These are called Stage 0, Stage 1 or Stage 2 bladder cancers. LET YOUR CAREGIVER KNOW ABOUT:  On the day of the procedure, your caregivers will need to know the last time you had anything to eat or drink. This includes water, gum, and candy. In advance, make sure they know about:   Any allergies.  All medications you are taking, including:  Herbs, eyedrops, over-the-counter medications and creams.  Blood thinners (anticoagulants), aspirin or other drugs that could affect blood clotting.  Use of steroids (by mouth or as creams).  Previous problems with anesthetics, including local anesthetics.  Possibility of pregnancy, if this applies.  Any history of blood clots.  Any history of bleeding or other blood problems.  Previous surgery.  Smoking history.  Any recent symptoms of colds or infections.  Other health problems. RISKS AND COMPLICATIONS This is usually a safe procedure. Every procedure has risks, though. For a transurethral resection, they include:  Infection. Antibiotic medication would need to be taken.  Bleeding.  Light bleeding may last for several days after the procedure.  If bleeding continues or is heavy, the bladder may need rinsing. Or, a new catheter might be put in for awhile.  Sometimes bed rest is needed.  Urination problems.  Pain and burning can occur when urinating. This usually goes away in a few days.  Scarring from the procedure can block the flow of urine.  Bladder damage.  It can be punctured or torn during removal of the tumor. If this happens, a catheter might be needed for longer. Antibiotics would be taken while the bladder heals.  Urine can leak through the hole or tear into the abdomen. If this happens, surgery may be needed to repair the bladder. BEFORE THE PROCEDURE   A medical evaluation will be done. This may include:  A physical examination.  Urine test. This  is to make sure you do not have a urinary tract infection.  Blood tests.  A test that checks the heart's rhythm (electrocardiogram).  Talking with an anesthesiologist. This is the person who will be in charge of the medication (anesthesia) to keep you from feeling pain during the transurethral resection. You might be asleep during the procedure (general anesthesia) or numb from the waist down, but awake during the procedure (spinal anesthesia). Ask your surgeon what to expect.  The person who is having a transurethral resection needs to give what is called informed consent. This requires signing a legal paper that gives permission for the procedure. To give informed consent:  You must understand how the procedure is done and why.  You must be told all the risks and benefits of the procedure.  You must sign the consent. Sometimes a legal guardian can do this.  Signing should be witnessed by a healthcare professional.  The day before the surgery, eat only a light dinner. Then, do not eat or drink anything for at least 8 hours before the surgery. Ask your caregiver if it is OK to take any needed medicines with a sip of water.  Arrive at least an hour before the surgery or whenever your surgeon recommends. This will give you time to check in and fill out any needed paperwork. PROCEDURE  The preparation:  You will change into a hospital gown.  A needle will be inserted in your arm. This is an intravenous access tube (IV). Medication will be able to flow directly into your body through this needle.  Small monitors will be put on your body. They are used to check your heart, blood pressure, and oxygen level.  You might be given medication that will help you relax (sedative).  You will be given a general anesthetic or spinal anesthesia.  The procedure:  Once you are asleep or numb from the waist down, your legs will be placed in stirrups.  The resectoscope will be passed through the  urethra into the bladder.  Fluid will be passed through the resectoscope. This will fill the bladder with water.  The surgeon will examine the bladder through the scope. If the scope has a camera, it can take pictures from inside the bladder. They can be projected onto a TV screen.  The surgeon will use various tools to remove the tumor in small pieces. Sometimes a laser (a beam of light energy) is used. Other tools may use electric current.  A tube (catheter) will often be placed so that urine can drain into a bag outside the body. This process helps stop bleeding. This tube keeps  blood clots from blocking the urethra.  The procedure usually takes 30 to 45 minutes. AFTER THE PROCEDURE  5. You will stay in a recovery area until the anesthesia has worn off. Your blood pressure and pulse will be checked every so often. Then you will be taken to a hospital room. 6. You may continue to get fluids through the IV for awhile. 7. Some pain is normal. The catheter might be uncomfortable. Pain is usually not severe. If it is, ask for pain medicine. 8. Your urine may look bloody after a transurethral resection. This is normal. 1. If bleeding is heavy, a hospital caregiver may rinse out the bladder (irrigation) through the catheter. 2. Once the urine is clear, the catheter will be taken out. 3. You will need to stay in the hospital until you can urinate on your own. 9. Most people stay in the hospital for up to 4 days. PROGNOSIS   Transurethral resection is considered the best way to treat bladder tumors that are not too far along. For most people, the treatment is successful. Sometimes, though, more treatment is needed.  Bladder cancers can come back even after a successful procedure. Because of this, be sure to have a checkup with your caregiver every 3 to 6 months. If everything is OK for 3 years, you can reduce the checkups to once a year.   This information is not intended to replace advice given  to you by your health care provider. Make sure you discuss any questions you have with your health care provider.   Document Released: 09/28/2009 Document Revised: 02/24/2012 Document Reviewed: 12/04/2009 Elsevier Interactive Patient Education 2016 [employer] Inc[employer] Hematuria, Adult Hematuria is blood in your urine. It can be caused by a bladder infection, kidney infection, prostate infection, kidney stone, or cancer of your urinary tract. Infections can usually be treated with medicine, and a kidney stone usually will pass through your urine. If neither of these is the cause of your hematuria, further workup to find out the reason may be needed. It is very important that you tell your health care provider about any blood you see in your urine, even if the blood stops without treatment or happens without causing pain. Blood in your urine that happens and then stops and then happens again can be a symptom of a very serious condition. Also, pain is not a symptom in the initial stages of many urinary cancers. HOME CARE INSTRUCTIONS   Drink lots of fluid, 3-4 quarts a day. If you have been diagnosed with an infection, cranberry juice is especially recommended, in addition to large amounts of water.  Avoid caffeine, tea, and carbonated beverages because they tend to irritate the bladder.  Avoid alcohol because it may irritate the prostate.  Take all medicines as directed by your health care provider.  If you were prescribed an antibiotic medicine, finish it all even if you start to feel better.  If you have been diagnosed with a kidney stone, follow your health care provider's instructions regarding straining your urine to catch the stone.  Empty your bladder often. Avoid holding urine for long periods of time.  After a bowel movement, women should cleanse front to back. Use each tissue only once.  Empty your bladder before and after sexual intercourse if you are a male. SEEK MEDICAL CARE  IF:  You develop back pain.  You have a fever.  You have a feeling of sickness in your stomach (nausea) or vomiting.  Your symptoms are  not better in 3 days. Return sooner if you are getting worse. SEEK IMMEDIATE MEDICAL CARE IF:   You develop severe vomiting and are unable to keep the medicine down.  You develop severe back or abdominal pain despite taking your medicines.  You begin passing a large amount of blood or clots in your urine.  You feel extremely weak or faint, or you pass out. MAKE SURE YOU:   Understand these instructions.  Will watch your condition.  Will get help right away if you are not doing well or get worse.   This information is not intended to replace advice given to you by your health care provider. Make sure you discuss any questions you have with your health care provider.   Document Released: 12/02/2005 Document Revised: 12/23/2014 Document Reviewed: 08/02/2013 Elsevier Interactive Patient Education 2016 [place], Adult A Foley catheter is a soft, flexible tube that is placed into the bladder to drain urine. A Foley catheter may be inserted if:  You leak urine or are not able to control when you urinate (urinary incontinence).  You are not able to urinate when you need to (urinary retention).  You had prostate surgery or surgery on the genitals.  You have certain medical conditions, such as multiple sclerosis, dementia, or a spinal cord injury. If you are going home with a Foley catheter in place, follow the instructions below. TAKING CARE OF THE CATHETER  Wash your hands with soap and water.  Using mild soap and warm water on a clean washcloth:  Clean the area on your body closest to the catheter insertion site using a circular motion, moving away from the catheter. Never wipe toward the catheter because this could sweep bacteria up into the urethra and cause infection.  Remove all traces of soap. Pat the area dry  with a clean towel. For males, reposition the foreskin.  Attach the catheter to your leg so there is no tension on the catheter. Use adhesive tape or a leg strap. If you are using adhesive tape, remove any sticky residue left behind by the previous tape you used.  Keep the drainage bag below the level of the bladder, but keep it off the floor.  Check throughout the day to be sure the catheter is working and urine is draining freely. Make sure the tubing does not become kinked.  Do not pull on the catheter or try to remove it. Pulling could damage internal tissues. TAKING CARE OF THE DRAINAGE BAGS You will be given two drainage bags to take home. One is a large overnight drainage bag, and the other is a smaller leg bag that fits underneath clothing. You may wear the overnight bag at any time, but you should never wear the smaller leg bag at night. Follow the instructions below for how to empty, change, and clean your drainage bags. Emptying the Drainage Bag You must empty your drainage bag when it is  - full or at least 2-3 times a day.  Wash your hands with soap and water.  Keep the drainage bag below your hips, below the level of your bladder. This stops urine from going back into the tubing and into your bladder.  Hold the dirty bag over the toilet or a clean container.  Open the pour spout at the bottom of the bag and empty the urine into the toilet or container. Do not let the pour spout touch the toilet, container, or any other surface. Doing so can  place bacteria on the bag, which can cause an infection.  Clean the pour spout with a gauze pad or cotton ball that has rubbing alcohol on it.  Close the pour spout.  Attach the bag to your leg with adhesive tape or a leg strap.  Wash your hands well. Changing the Drainage Bag Change your drainage bag once a month or sooner if it starts to smell bad or look dirty. Below are steps to follow when changing the drainage bag.  Wash your  hands with soap and water.  Pinch off the rubber catheter so that urine does not spill out.  Disconnect the catheter tube from the drainage tube at the connection valve. Do not let the tubes touch any surface.  Clean the end of the catheter tube with an alcohol wipe. Use a different alcohol wipe to clean the end of the drainage tube.  Connect the catheter tube to the drainage tube of the clean drainage bag.  Attach the new bag to the leg with adhesive tape or a leg strap. Avoid attaching the new bag too tightly.  Wash your hands well. Cleaning the Drainage Bag 10. Wash your hands with soap and water. 11. Wash the bag in warm, soapy water. 12. Rinse the bag thoroughly with warm water. 13. Fill the bag with a solution of white vinegar and water (1 cup vinegar to 1 qt warm water [.2 L vinegar to 1 L warm water]). Close the bag and soak it for 30 minutes in the solution. 14. Rinse the bag with warm water. 35. Hang the bag to dry with the pour spout open and hanging downward. 16. Store the clean bag (once it is dry) in a clean plastic bag. 65. Wash your hands well. PREVENTING INFECTION  Wash your hands before and after handling your catheter.  Take showers daily and wash the area where the catheter enters your body. Do not take baths. Replace wet leg straps with dry ones, if this applies.  Do not use powders, sprays, or lotions on the genital area. Only use creams, lotions, or ointments as directed by your caregiver.  For females, wipe from front to back after each bowel movement.  Drink enough fluids to keep your urine clear or pale yellow unless you have a fluid restriction.  Do not let the drainage bag or tubing touch or lie on the floor.  Wear cotton underwear to absorb moisture and to keep your skin drier. SEEK MEDICAL CARE IF:   Your urine is cloudy or smells unusually bad.  Your catheter becomes clogged.  You are not draining urine into the bag or your bladder feels  full.  Your catheter starts to leak. SEEK IMMEDIATE MEDICAL CARE IF:   You have pain, swelling, redness, or pus where the catheter enters the body.  You have pain in the abdomen, legs, lower back, or bladder.  You have a fever.  You see blood fill the catheter, or your urine is pink or red.  You have nausea, vomiting, or chills.  Your catheter gets pulled out. MAKE SURE YOU:   Understand these instructions.  Will watch your condition.  Will get help right away if you are not doing well or get worse.   This information is not intended to replace advice given to you by your health care provider. Make sure you discuss any questions you have with your health care provider.   Document Released: 12/02/2005 Document Revised: 04/18/2014 Document Reviewed: 11/23/2012 Elsevier Interactive Patient Education  Education ©2016 [employer] Inc[employer] ° °

## 2015-10-17 NOTE — Anesthesia Preprocedure Evaluation (Signed)
Anesthesia Evaluation  Patient identified by MRN, date of birth, ID band Patient awake    Reviewed: Allergy & Precautions  History of Anesthesia Complications (+) PONV  Airway Mallampati: II       Dental  (+) Teeth Intact   Pulmonary neg pulmonary ROS, former smoker,           Cardiovascular hypertension, + DOE  + pacemaker + Valvular Problems/Murmurs (aortic valve replacement)      Neuro/Psych negative neurological ROS     GI/Hepatic Neg liver ROS, GERD  Medicated,  Endo/Other    Renal/GU Renal InsufficiencyRenal diseasenegative Renal ROS     Musculoskeletal   Abdominal   Peds  Hematology  (+) anemia ,   Anesthesia Other Findings   Reproductive/Obstetrics                             Anesthesia Physical Anesthesia Plan  ASA: III  Anesthesia Plan: General   Post-op Pain Management:    Induction: Intravenous  Airway Management Planned: LMA  Additional Equipment:   Intra-op Plan:   Post-operative Plan:   Informed Consent: I have reviewed the patients History and Physical, chart, labs and discussed the procedure including the risks, benefits and alternatives for the proposed anesthesia with the patient or authorized representative who has indicated his/her understanding and acceptance.     Plan Discussed with:   Anesthesia Plan Comments:         Anesthesia Quick Evaluation

## 2015-10-17 NOTE — Anesthesia Procedure Notes (Signed)
Procedure Name: LMA Insertion Date/Time: 10/17/2015 11:23 AM Performed by: Dionne Bucy Pre-anesthesia Checklist: Patient identified, Patient being monitored, Timeout performed, Emergency Drugs available and Suction available Patient Re-evaluated:Patient Re-evaluated prior to inductionOxygen Delivery Method: Circle system utilized Preoxygenation: Pre-oxygenation with 100% oxygen Intubation Type: IV induction Ventilation: Mask ventilation without difficulty LMA: LMA inserted LMA Size: 5.0 Tube type: Oral Number of attempts: 1 Placement Confirmation: positive ETCO2 Tube secured with: Tape Dental Injury: Teeth and Oropharynx as per pre-operative assessment

## 2015-10-18 LAB — SURGICAL PATHOLOGY

## 2015-10-27 ENCOUNTER — Other Ambulatory Visit: Payer: Self-pay | Admitting: Family Medicine

## 2015-10-31 ENCOUNTER — Other Ambulatory Visit: Payer: Self-pay | Admitting: Family Medicine

## 2015-11-02 ENCOUNTER — Inpatient Hospital Stay: Payer: Medicare Other | Attending: Family Medicine

## 2015-11-02 DIAGNOSIS — D519 Vitamin B12 deficiency anemia, unspecified: Secondary | ICD-10-CM

## 2015-11-02 DIAGNOSIS — E538 Deficiency of other specified B group vitamins: Secondary | ICD-10-CM | POA: Diagnosis not present

## 2015-11-02 DIAGNOSIS — Z79899 Other long term (current) drug therapy: Secondary | ICD-10-CM | POA: Diagnosis not present

## 2015-11-02 DIAGNOSIS — C679 Malignant neoplasm of bladder, unspecified: Secondary | ICD-10-CM | POA: Insufficient documentation

## 2015-11-02 MED ORDER — CYANOCOBALAMIN 1000 MCG/ML IJ SOLN
1000.0000 ug | Freq: Once | INTRAMUSCULAR | Status: AC
  Administered 2015-11-02: 1000 ug via INTRAMUSCULAR
  Filled 2015-11-02: qty 1

## 2015-11-08 ENCOUNTER — Encounter: Payer: Medicare Other | Attending: Surgery | Admitting: Surgery

## 2015-11-08 DIAGNOSIS — C679 Malignant neoplasm of bladder, unspecified: Secondary | ICD-10-CM | POA: Insufficient documentation

## 2015-11-08 DIAGNOSIS — N183 Chronic kidney disease, stage 3 (moderate): Secondary | ICD-10-CM | POA: Diagnosis not present

## 2015-11-08 DIAGNOSIS — Z950 Presence of cardiac pacemaker: Secondary | ICD-10-CM | POA: Diagnosis not present

## 2015-11-08 DIAGNOSIS — I129 Hypertensive chronic kidney disease with stage 1 through stage 4 chronic kidney disease, or unspecified chronic kidney disease: Secondary | ICD-10-CM | POA: Insufficient documentation

## 2015-11-08 DIAGNOSIS — I4891 Unspecified atrial fibrillation: Secondary | ICD-10-CM | POA: Insufficient documentation

## 2015-11-08 DIAGNOSIS — Z880 Allergy status to penicillin: Secondary | ICD-10-CM | POA: Diagnosis not present

## 2015-11-08 DIAGNOSIS — L598 Other specified disorders of the skin and subcutaneous tissue related to radiation: Secondary | ICD-10-CM | POA: Diagnosis not present

## 2015-11-08 DIAGNOSIS — N3041 Irradiation cystitis with hematuria: Secondary | ICD-10-CM | POA: Diagnosis present

## 2015-11-09 NOTE — Progress Notes (Signed)
DESI, STACEY ([id number]) Visit Report for 11/08/2015 Abuse/Suicide Risk Screen Details Patient Name: Austin Contreras, Austin Contreras. Date of Service: 11/08/2015 10:00 AM Medical Record Number: [id number] Patient Account Number: [id number] Date of Birth/Sex: Jul 22, 1926 (81 y.o. Male) Treating RN: Cornell Barman Primary Care Physician: Frazier Richards Other Clinician: Referring Physician: Maryan Puls Treating Physician/Extender: BURNS III, Charlean Sanfilippo in Treatment: 0 Abuse/Suicide Risk Screen Items Answer ABUSE/SUICIDE RISK SCREEN: Has anyone close to you tried to hurt or harm you recentlyo No Do you feel uncomfortable with anyone in your familyo No Has anyone forced you do things that you didnot want to doo No Do you have any thoughts of harming yourselfo No Patient displays signs or symptoms of abuse and/or neglect. No Electronic Signature(s) Signed: 11/08/2015 3:40:20 PM By: Gretta Cool, RN, BSN, Kim RN, BSN Entered By: Gretta Cool, RN, BSN, Kim on 11/08/2015 10:31:28 Nuncio, Bret BMarland Kitchen ([id number]) -------------------------------------------------------------------------------- Activities of Daily Living Details Patient Name: Austin Contreras, Austin Contreras. Date of Service: 11/08/2015 10:00 AM Medical Record Number: [id number] Patient Account Number: [id number] Date of Birth/Sex: [birth year] (81 y.o. Male) Treating RN: Cornell Barman Primary Care Physician: Frazier Richards Other Clinician: Referring Physician: Maryan Puls Treating Physician/Extender: BURNS III, Charlean Sanfilippo in Treatment: 0 Activities of Daily Living Items Answer Activities of Daily Living (Please select one for each item) Drive Automobile Completely Able Take Medications Completely Able Use Telephone Completely Able Care for Appearance Completely Able Use Toilet Completely Able Bath / Shower Completely Able Dress Self Completely Able Feed Self Completely Able Walk Completely Able Get In / Out Bed Completely Able Housework Completely  Able Prepare Meals Completely Able Handle Money Completely Able Shop for Self Completely Able Electronic Signature(s) Signed: 11/08/2015 3:40:20 PM By: Gretta Cool, RN, BSN, Kim RN, BSN Entered By: Gretta Cool, RN, BSN, Kim on 11/08/2015 10:31:42 Austin Contreras, Austin BMarland Kitchen ([id number]) -------------------------------------------------------------------------------- Education Assessment Details Patient Name: Austin Contreras, Austin Contreras. Date of Service: 11/08/2015 10:00 AM Medical Record Number: [id number] Patient Account Number: [id number] Date of Birth/Sex: Jan 29, 1926 (81 y.o. Male) Treating RN: Cornell Barman Primary Care Physician: Frazier Richards Other Clinician: Referring Physician: Maryan Puls Treating Physician/Extender: BURNS III, Charlean Sanfilippo in Treatment: 0 Primary Learner Assessed: Patient Learning Preferences/Education Level/Primary Language Learning Preference: Explanation, Demonstration Highest Education Level: High School Preferred Language: English Cognitive Barrier Assessment/Beliefs Language Barrier: No Translator Needed: No Memory Deficit: No Emotional Barrier: No Cultural/Religious Beliefs Affecting Medical No Care: Physical Barrier Assessment Impaired Vision: No Impaired Hearing: Yes hearing loss Knowledge/Comprehension Assessment Knowledge Level: High Comprehension Level: High Ability to understand written High instructions: Ability to understand verbal High instructions: Motivation Assessment Anxiety Level: Calm Cooperation: Cooperative Education Importance: Acknowledges Need Interest in Health Problems: Asks Questions Perception: Coherent Willingness to Engage in Self- High Management Activities: Readiness to Engage in Self- High Management Activities: Electronic Signature(s) Signed: 11/08/2015 3:40:20 PM By: Gretta Cool, RN, BSN, Kim RN, BSN Austin Contreras, Austin Contreras. ([id number]) Entered By: Gretta Cool, RN, BSN, Kim on 11/08/2015 10:32:29 Austin Contreras, Austin Contreras  ([id number]) -------------------------------------------------------------------------------- Fall Risk Assessment Details Patient Name: Austin Contreras, Austin Contreras. Date of Service: 11/08/2015 10:00 AM Medical Record Number: [id number] Patient Account Number: [id number] Date of Birth/Sex: March 06, 1926 (81 y.o. Male) Treating RN: Cornell Barman Primary Care Physician: Frazier Richards Other Clinician: Referring Physician: Maryan Puls Treating Physician/Extender: BURNS III, Charlean Sanfilippo in Treatment: 0 Fall Risk Assessment Items Have you had 2 or more falls in the last 12 monthso 0 No Have you had any fall that resulted in injury in the last 12 monthso 0 No FALL RISK ASSESSMENT: History of falling - immediate or  within 3 months 0 No Secondary diagnosis 0 No Ambulatory aid None/bed rest/wheelchair/nurse 0 Yes Crutches/cane/walker 0 No Furniture 0 No IV Access/Saline Lock 0 No Gait/Training Normal/bed rest/immobile 0 Yes Weak 0 No Impaired 0 No Mental Status Oriented to own ability 0 Yes Electronic Signature(s) Signed: 11/08/2015 3:40:20 PM By: Gretta Cool, RN, BSN, Kim RN, BSN Entered By: Gretta Cool, RN, BSN, Kim on 11/08/2015 10:32:48 Austin Contreras, Austin BMarland Kitchen ([id number]) -------------------------------------------------------------------------------- Nutrition Risk Assessment Details Patient Name: Austin Contreras, Austin Contreras. Date of Service: 11/08/2015 10:00 AM Medical Record Number: [id number] Patient Account Number: [id number] Date of Birth/Sex: [birth year] (81 y.o. Male) Treating RN: Cornell Barman Primary Care Physician: Frazier Richards Other Clinician: Referring Physician: Maryan Puls Treating Physician/Extender: BURNS III, WALTER Weeks in Treatment: 0 Height (in): 70 Weight (lbs): 170 Body Mass Index (BMI): 24.4 Nutrition Risk Assessment Items NUTRITION RISK SCREEN: I have an illness or condition that made me change the kind and/or 0 No amount of food I eat I eat fewer than two meals per day 0 No I eat few  fruits and vegetables, or milk products 0 No I have three or more drinks of beer, liquor or wine almost every day 0 No I have tooth or mouth problems that make it hard for me to eat 0 No I don't always have enough money to buy the food I need 0 No I eat alone most of the time 0 No I take three or more different prescribed or over-the-counter drugs a 0 No day Without wanting to, I have lost or gained 10 pounds in the last six 0 No months I am not always physically able to shop, cook and/or feed myself 0 No Nutrition Protocols Good Risk Protocol 0 No interventions needed Moderate Risk Protocol Electronic Signature(s) Signed: 11/08/2015 3:40:20 PM By: Gretta Cool, RN, BSN, Kim RN, BSN Entered By: Gretta Cool, RN, BSN, Kim on 11/08/2015 10:32:55

## 2015-11-09 NOTE — Progress Notes (Addendum)
Austin, Contreras ([id number]) Visit Report for 11/08/2015 Allergy List Details Patient Name: Austin Contreras, Austin B. Date of Service: 11/08/2015 10:00 AM Medical Record Number: [id number] Patient Account Number: [id number] Date of Birth/Sex: [birth year] (81 y.o. Male) Treating RN: Cornell Barman Primary Care Physician: Frazier Richards Other Clinician: Referring Physician: Maryan Puls Treating Physician/Extender: BURNS III, Charlean Sanfilippo in Treatment: 0 Allergies Active Allergies grass amiodarone Penicillins Allergy Notes Electronic Signature(s) Signed: 11/08/2015 3:40:20 PM By: Gretta Cool, RN, BSN, Kim RN, BSN Entered By: Gretta Cool, RN, BSN, Kim on 11/08/2015 10:26:52 Austin, Dhyan Jacinto Contreras ([id number]) -------------------------------------------------------------------------------- Arrival Information Details Patient Name: Austin Contreras, Austin B. Date of Service: 11/08/2015 10:00 AM Medical Record Number: [id number] Patient Account Number: [id number] Date of Birth/Sex: 1926-11-24 (81 y.o. Male) Treating RN: Cornell Barman Primary Care Physician: Frazier Richards Other Clinician: Referring Physician: Maryan Puls Treating Physician/Extender: BURNS III, Charlean Sanfilippo in Treatment: 0 Visit Information Patient Arrived: Ambulatory Arrival Time: 10:20 Accompanied By: wife Transfer Assistance: None Patient Identification Verified: Yes Secondary Verification Process Yes Completed: History Since Last Visit Electronic Signature(s) Signed: 11/08/2015 3:40:20 PM By: Gretta Cool, RN, BSN, Kim RN, BSN Entered By: Gretta Cool, RN, BSN, Kim on 11/08/2015 10:23:37 Austin, Amish Jacinto Contreras ([id number]) -------------------------------------------------------------------------------- Clinic Level of Care Assessment Details Patient Name: Austin Contreras, Austin B. Date of Service: 11/08/2015 10:00 AM Medical Record Number: [id number] Patient Account Number: [id number] Date of Birth/Sex: Nov 20, 1926 (81 y.o. Male) Treating RN: Cornell Barman Primary Care  Physician: Frazier Richards Other Clinician: Referring Physician: Maryan Puls Treating Physician/Extender: BURNS III, Charlean Sanfilippo in Treatment: 0 Clinic Level of Care Assessment Items TOOL 2 Quantity Score []  - Use when only an EandM is performed on the INITIAL visit 0 ASSESSMENTS - Nursing Assessment / Reassessment []  - General Physical Exam (combine w/ comprehensive assessment (listed just 0 below) when performed on new pt. evals) X - Comprehensive Assessment (HX, ROS, Risk Assessments, Wounds Hx, etc.) 1 25 ASSESSMENTS - Wound and Skin Assessment / Reassessment X - Simple Wound Assessment / Reassessment - one wound 1 5 []  - Complex Wound Assessment / Reassessment - multiple wounds 0 []  - Dermatologic / Skin Assessment (not related to wound area) 0 ASSESSMENTS - Ostomy and/or Continence Assessment and Care []  - Incontinence Assessment and Management 0 []  - Ostomy Care Assessment and Management (repouching, etc.) 0 PROCESS - Coordination of Care X - Simple Patient / Family Education for ongoing care 1 15 []  - Complex (extensive) Patient / Family Education for ongoing care 0 X - Staff obtains Programmer, systems, Records, Test Results / Process Orders 1 10 []  - Staff telephones HHA, Nursing Homes / Clarify orders / etc 0 []  - Routine Transfer to another Facility (non-emergent condition) 0 []  - Routine Hospital Admission (non-emergent condition) 0 X - New Admissions / Biomedical engineer / Ordering NPWT, Apligraf, etc. 1 15 []  - Emergency Hospital Admission (emergent condition) 0 X - Simple Discharge Coordination 1 10 Delbridge, Koltin B. ([id number]) []  - Complex (extensive) Discharge Coordination 0 PROCESS - Special Needs []  - Pediatric / Minor Patient Management 0 []  - Isolation Patient Management 0 []  - Hearing / Language / Visual special needs 0 []  - Assessment of Community assistance (transportation, D/C planning, etc.) 0 []  - Additional assistance / Altered mentation 0 []  -  Support Surface(s) Assessment (bed, cushion, seat, etc.) 0 INTERVENTIONS - Wound Cleansing / Measurement []  - Wound Imaging (photographs - any number of wounds) 0 []  - Wound Tracing (instead of photographs) 0 []  - Simple Wound Measurement - one wound 0 []  - Complex Wound Measurement -  multiple wounds 0 []  - Simple Wound Cleansing - one wound 0 []  - Complex Wound Cleansing - multiple wounds 0 INTERVENTIONS - Wound Dressings []  - Small Wound Dressing one or multiple wounds 0 []  - Medium Wound Dressing one or multiple wounds 0 []  - Large Wound Dressing one or multiple wounds 0 []  - Application of Medications - injection 0 INTERVENTIONS - Miscellaneous []  - External ear exam 0 []  - Specimen Collection (cultures, biopsies, blood, body fluids, etc.) 0 []  - Specimen(s) / Culture(s) sent or taken to Lab for analysis 0 []  - Patient Transfer (multiple staff / Civil Service fast streamer / Similar devices) 0 []  - Simple Staple / Suture removal (25 or less) 0 []  - Complex Staple / Suture removal (26 or more) 0 Apel, Neko B. ([id number]) []  - Hypo / Hyperglycemic Management (close monitor of Blood Glucose) 0 []  - Ankle / Brachial Index (ABI) - do not check if billed separately 0 Has the patient been seen at the hospital within the last three years: Yes Total Score: 80 Level Of Care: New/Established - Level 3 Electronic Signature(s) Signed: 11/08/2015 3:40:20 PM By: Gretta Cool, RN, BSN, Kim RN, BSN Entered By: Gretta Cool, RN, BSN, Kim on 11/08/2015 11:01:10 Austin Contreras, Austin B. ([id number]) -------------------------------------------------------------------------------- Encounter Discharge Information Details Patient Name: Contreras, Austin B. Date of Service: 11/08/2015 10:00 AM Medical Record Number: [id number] Patient Account Number: [id number] Date of Birth/Sex: [birth year] (81 y.o. Male) Treating RN: Cornell Barman Primary Care Physician: Frazier Richards Other Clinician: Referring Physician: Maryan Puls Treating  Physician/Extender: BURNS III, Charlean Sanfilippo in Treatment: 0 Encounter Discharge Information Items Discharge Pain Level: 0 Discharge Condition: Stable Ambulatory Status: Ambulatory Discharge Destination: Home Transportation: Private Auto Accompanied By: wife Schedule Follow-up Appointment: Yes Medication Reconciliation completed and provided to Patient/Care Yes Martel Galvan: Provided on Clinical Summary of Care: 11/08/2015 Form Type Recipient Paper Patient FG Electronic Signature(s) Signed: 11/08/2015 11:09:16 AM By: Ruthine Dose Entered By: Ruthine Dose on 11/08/2015 11:09:16 Rincon, Darreld B. ([id number]) -------------------------------------------------------------------------------- General Visit Notes Details Patient Name: Austin Contreras, Austin B. Date of Service: 11/08/2015 10:00 AM Medical Record Number: [id number] Patient Account Number: [id number] Date of Birth/Sex: 05/07/1926 (81 y.o. Male) Treating RN: Cornell Barman Primary Care Physician: Frazier Richards Other Clinician: Referring Physician: Maryan Puls Treating Physician/Extender: BURNS III, Charlean Sanfilippo in Treatment: 0 Notes Patient has been sent here to discuss HBO treatments for radiation cystitis. Electronic Signature(s) Signed: 11/08/2015 3:40:20 PM By: Gretta Cool, RN, BSN, Kim RN, BSN Entered By: Gretta Cool, RN, BSN, Kim on 11/08/2015 10:35:26 Schnick, Marijo File ([id number]) -------------------------------------------------------------------------------- Multi Wound Chart Details Patient Name: Austin Contreras, Austin B. Date of Service: 11/08/2015 10:00 AM Medical Record Number: [id number] Patient Account Number: [id number] Date of Birth/Sex: 09-Sep-1926 (81 y.o. Male) Treating RN: Cornell Barman Primary Care Physician: Frazier Richards Other Clinician: Referring Physician: Maryan Puls Treating Physician/Extender: BURNS III, WALTER Weeks in Treatment: 0 Vital Signs Height(in): 70 Pulse(bpm): 78 Weight(lbs): 170 Blood  Pressure 146/101 (mmHg): Body Mass Index(BMI): 24 Temperature(F): 98.1 Respiratory Rate 18 (breaths/min): Wound Assessments Treatment Notes Electronic Signature(s) Signed: 11/08/2015 3:40:20 PM By: Gretta Cool, RN, BSN, Kim RN, BSN Entered By: Gretta Cool, RN, BSN, Kim on 11/08/2015 10:55:16 Hovatter, Marijo File ([id number]) -------------------------------------------------------------------------------- [place] Details Patient Name: Austin Contreras, Austin B. Date of Service: 11/08/2015 10:00 AM Medical Record Number: [id number] Patient Account Number: [id number] Date of Birth/Sex: [birth year] (81 y.o. Male) Treating RN: Cornell Barman Primary Care Physician: Frazier Richards Other Clinician: Referring Physician: Maryan Puls Treating Physician/Extender: BURNS III, Charlean Sanfilippo in Treatment: 0 Active Inactive Electronic Signature(s) Signed: 11/13/2015  5:17:02 PM By: Gretta Cool, RN, BSN, Kim RN, BSN Previous Signature: 11/08/2015 3:40:20 PM Version By: Gretta Cool RN, BSN, Kim RN, BSN Entered By: Gretta Cool, RN, BSN, Kim on 11/13/2015 12:45:00 Nickolson, Chrishon B. ([id number]) -------------------------------------------------------------------------------- Pain Assessment Details Patient Name: Austin Contreras, Austin B. Date of Service: 11/08/2015 10:00 AM Medical Record Number: [id number] Patient Account Number: [id number] Date of Birth/Sex: 03-24-1926 (81 y.o. Male) Treating RN: Cornell Barman Primary Care Physician: Frazier Richards Other Clinician: Referring Physician: Maryan Puls Treating Physician/Extender: BURNS III, Charlean Sanfilippo in Treatment: 0 Active Problems Location of Pain Severity and Description of Pain Patient Has Paino Yes Site Locations Pain Location: Generalized Pain Duration of the Pain. Constant / Intermittento Intermittent Character of Pain Describe the Pain: Aching, Sharp, Tender, Other: Patient states it feels like he's sitting on a rock. Pain Management and Medication Current  Pain Management: Notes Patient states it feels like he's sitting on a IT trainer) Signed: 11/08/2015 3:40:20 PM By: Gretta Cool, RN, BSN, Kim RN, BSN Entered By: Gretta Cool, RN, BSN, Kim on 11/08/2015 10:24:28 Rudd, Karman Jacinto Contreras ([id number]) -------------------------------------------------------------------------------- Vitals Details Patient Name: Austin Contreras, Austin B. Date of Service: 11/08/2015 10:00 AM Medical Record Number: [id number] Patient Account Number: [id number] Date of Birth/Sex: 1926/10/14 (81 y.o. Male) Treating RN: Cornell Barman Primary Care Physician: Frazier Richards Other Clinician: Referring Physician: Maryan Puls Treating Physician/Extender: BURNS III, Charlean Sanfilippo in Treatment: 0 Vital Signs Time Taken: 10:24 Temperature (F): 98.1 Height (in): 70 Pulse (bpm): 78 Source: Stated Respiratory Rate (breaths/min): 18 Weight (lbs): 170 Blood Pressure (mmHg): 146/101 Source: Stated Reference Range: 80 - 120 mg / dl Body Mass Index (BMI): 24.4 Notes Patient states he took medication this morning. Instructed to see PCP if BP continues to be high. MD Notified. Electronic Signature(s) Signed: 11/08/2015 3:40:20 PM By: Gretta Cool, RN, BSN, Kim RN, BSN Entered By: Gretta Cool, RN, BSN, Kim on 11/08/2015 10:26:36

## 2015-11-09 NOTE — Progress Notes (Signed)
Austin Contreras ([id number]) Visit Report for 11/08/2015 Chief Complaint Document Details Patient Name: Austin Contreras, Austin Contreras. Date of Service: 11/08/2015 10:00 AM Medical Record Number: [id number] Patient Account Number: [id number] Date of Birth/Sex: 1926/07/07 (81 y.o. Male) Treating RN: Cornell Barman Primary Care Physician: Frazier Richards Other Clinician: Referring Physician: Maryan Puls Treating Physician/Extender: BURNS III, Charlean Sanfilippo in Treatment: 0 Information Obtained from: Patient Chief Complaint Radiation cystitis Electronic Signature(s) Signed: 11/08/2015 3:24:44 PM By: Loletha Grayer MD Entered By: Loletha Grayer on 11/08/2015 12:59:37 Austin Contreras, Austin Contreras. ([id number]) -------------------------------------------------------------------------------- HPI Details Patient Name: Austin Contreras. Date of Service: 11/08/2015 10:00 AM Medical Record Number: [id number] Patient Account Number: [id number] Date of Birth/Sex: May 30, 1926 (81 y.o. Male) Treating RN: Cornell Barman Primary Care Physician: Frazier Richards Other Clinician: Referring Physician: Maryan Puls Treating Physician/Extender: BURNS III, Charlean Sanfilippo in Treatment: 0 History of Present Illness Location: Over the past month, he has had increased urinary frequency and occasional blood in his urine and pain while urinating. He also has occasional anal pain while having a BM. He denies any increase in BMs or any blood in his stool. He underwent raidatio therapy and surgery for bladder cancer about 6 months ago. Quality: He describes the pain as a dull, burning pain. Severity: The pain is moderately severe. Duration: He has had the pain for about 1 month now. Context: He has the pain mainly when he urinates. He does also decribe HPI Description: Pleasant 81 year old with history of bladder cancer, radiation, radiation cystitis, aortic valve replacement, atrial fibrillation, pacemaker, and chronic kidney  disease. Referred by Dr. Yves Dill for consideration of hyperbaric oxygen therapy for radiation cystitis. The patient says that he underwent radiation over one year ago. No records available. No record of recent cystoscopy or surgery available. He reports hematuria and urinary frequency. No significant pain. No fever or chills. Electronic Signature(s) Signed: 11/08/2015 3:24:44 PM By: Loletha Grayer MD Entered By: Loletha Grayer on 11/08/2015 13:06:15 Austin Contreras, Austin Contreras Kitchen ([id number]) -------------------------------------------------------------------------------- Physical Exam Details Patient Name: Austin Contreras. Date of Service: 11/08/2015 10:00 AM Medical Record Number: [id number] Patient Account Number: [id number] Date of Birth/Sex: Sep 12, 1926 (81 y.o. Male) Treating RN: Cornell Barman Primary Care Physician: Frazier Richards Other Clinician: Referring Physician: Maryan Puls Treating Physician/Extender: BURNS III, Kile Kabler Weeks in Treatment: 0 Constitutional . Pulse regular. Respirations normal and unlabored. Afebrile. Marland Kitchen Respiratory WNL. No retractions.. Breath sounds WNL, No rubs, rales, rhonchi, or wheeze.. Cardiovascular Heart rhythm and rate regular, no murmur or gallop.. Gastrointestinal (GI) Abdomen without masses or tenderness.. Integumentary (Hair, Skin) No suspicious lesions. No crepitus or fluctuance. No peri-wound warmth or erythema. No masses.. Neurological Sensation normal to touch, pin,and vibration. Psychiatric Judgement and insight Intact.. Oriented times 3.. No evidence of depression, anxiety, or agitation.. Electronic Signature(s) Signed: 11/08/2015 3:24:44 PM By: Loletha Grayer MD Entered By: Loletha Grayer on 11/08/2015 13:06:43 Austin Contreras, Austin Contreras Kitchen ([id number]) -------------------------------------------------------------------------------- Physician Orders Details Patient Name: Austin Contreras, Austin Contreras. Date of Service: 11/08/2015 10:00 AM Medical Record  Number: [id number] Patient Account Number: [id number] Date of Birth/Sex: 31-Dec-1925 (81 y.o. Male) Treating RN: Cornell Barman Primary Care Physician: Frazier Richards Other Clinician: Referring Physician: Maryan Puls Treating Physician/Extender: BURNS III, Charlean Sanfilippo in Treatment: 0 Verbal / Phone Orders: Yes Clinician: Cornell Barman Read Back and Verified: Yes Diagnosis Coding Hyperbaric Oxygen Therapy o Evaluate for HBO Therapy o Indication: - Radiation Cystitis o If appropriate for treatment, begin HBOT per protocol: o One treatment per day (delivered Monday through Friday unless otherwise specified  in Special Instructions below): o Total # of Treatments: - 20 Electronic Signature(s) Signed: 11/08/2015 3:24:44 PM By: Loletha Grayer MD Signed: 11/08/2015 3:40:20 PM By: Gretta Cool RN, BSN, Kim RN, BSN Entered By: Gretta Cool, RN, BSN, Kim on 11/08/2015 11:00:33 Austin Contreras ([id number]) -------------------------------------------------------------------------------- Problem List Details Patient Name: Austin Contreras, Austin Contreras. Date of Service: 11/08/2015 10:00 AM Medical Record Number: [id number] Patient Account Number: [id number] Date of Birth/Sex: 08/08/1926 (81 y.o. Male) Treating RN: Cornell Barman Primary Care Physician: Frazier Richards Other Clinician: Referring Physician: Maryan Puls Treating Physician/Extender: BURNS III, Charlean Sanfilippo in Treatment: 0 Active Problems ICD-10 Encounter Code Description Active Date Diagnosis L59.8 Other specified disorders of the skin and subcutaneous 11/08/2015 Yes tissue related to radiation C67.9 Malignant neoplasm of bladder, unspecified 11/08/2015 Yes Z95.0 Presence of cardiac pacemaker 11/08/2015 Yes N30.41 Irradiation cystitis with hematuria 11/08/2015 Yes Inactive Problems Resolved Problems Electronic Signature(s) Signed: 11/08/2015 3:24:44 PM By: Loletha Grayer MD Entered By: Loletha Grayer on 11/08/2015  13:03:51 Austin Contreras, Austin Contreras. ([id number]) -------------------------------------------------------------------------------- Progress Note Details Patient Name: Austin Contreras, Austin Contreras. Date of Service: 11/08/2015 10:00 AM Medical Record Number: [id number] Patient Account Number: [id number] Date of Birth/Sex: [birth year] (81 y.o. Male) Treating RN: Cornell Barman Primary Care Physician: Frazier Richards Other Clinician: Referring Physician: Maryan Puls Treating Physician/Extender: BURNS III, Charlean Sanfilippo in Treatment: 0 Subjective Chief Complaint Information obtained from Patient Radiation cystitis History of Present Illness (HPI) The following HPI elements were documented for the patient's wound: Location: Over the past month, he has had increased urinary frequency and occasional blood in his urine and pain while urinating. He also has occasional anal pain while having a BM. He denies any increase in BMs or any blood in his stool. He underwent raidatio therapy and surgery for bladder cancer about 6 months ago. Quality: He describes the pain as a dull, burning pain. Severity: The pain is moderately severe. Duration: He has had the pain for about 1 month now. Context: He has the pain mainly when he urinates. He does also decribe Pleasant 81 year old with history of bladder cancer, radiation, radiation cystitis, aortic valve replacement, atrial fibrillation, pacemaker, and chronic kidney disease. Referred by Dr. Yves Dill for consideration of hyperbaric oxygen therapy for radiation cystitis. The patient says that he underwent radiation over one year ago. No records available. No record of recent cystoscopy or surgery available. He reports hematuria and urinary frequency. No significant pain. No fever or chills. Wound History Patient reportedly has not tested positive for osteomyelitis. Patient reportedly has not had testing performed to evaluate circulation in the legs. Patient History Information  obtained from Patient. Allergies grass, amiodarone, Penicillins Family History Cancer - Mother, Siblings, Diabetes - Siblings, Heart Disease - Father, Hypertension - Father, Kidney Disease - Siblings, Lung Disease - Siblings, Stroke - Father, Thyroid Problems - Siblings, No family history of Hereditary Spherocytosis, Seizures, Tuberculosis. Mabry, Rambo Contreras. ([id number]) Social History Never smoker, Marital Status - Married, Alcohol Use - Rarely - beer, Drug Use - No History, Caffeine Use - Daily - coffee. Medical History Eyes Patient has history of Cataracts - both eyes removed 2years ago Denies history of Glaucoma, Optic Neuritis Ear/Nose/Mouth/Throat Denies history of Chronic sinus problems/congestion, Middle ear problems Hematologic/Lymphatic Patient has history of Anemia Denies history of Hemophilia, Human Immunodeficiency Virus, Lymphedema, Sickle Cell Disease Respiratory Denies history of Aspiration, Asthma, Chronic Obstructive Pulmonary Disease (COPD), Pneumothorax, Sleep Apnea, Tuberculosis Cardiovascular Patient has history of Arrhythmia, Hypertension Denies history of Angina, Congestive Heart Failure, Coronary Artery Disease, Deep Vein Thrombosis,  Hypotension, Myocardial Infarction, Peripheral Arterial Disease, Peripheral Venous Disease, Phlebitis, Vasculitis Gastrointestinal Denies history of Cirrhosis , Colitis, Crohn s, Hepatitis A, Hepatitis Contreras, Hepatitis C Endocrine Denies history of Type I Diabetes, Type II Diabetes Genitourinary Patient has history of End Stage Renal Disease - stage 3 Immunological Denies history of Lupus Erythematosus, Raynaud s, Scleroderma Integumentary (Skin) Denies history of History of Burn, History of pressure wounds Musculoskeletal Denies history of Gout, Rheumatoid Arthritis, Osteoarthritis, Osteomyelitis Neurologic Denies history of Dementia, Neuropathy, Quadriplegia, Paraplegia, Seizure Disorder Oncologic Patient has history of  Received Chemotherapy - wash in bladder after surgery, Received Radiation - 15 months ago Psychiatric Denies history of Anorexia/bulimia, Confinement Anxiety Hospitalization/Surgery History - 10/17/2015, [facility], bladder surgery. Medical And Surgical History Notes Constitutional Symptoms (General Health) Cancer removed from bladder. November 1st. Review of Systems (ROS) Constitutional Symptoms (General Health) The patient has no complaints or symptoms. Austin Contreras, Austin Contreras. ([id number]) Eyes The patient has no complaints or symptoms. Hematologic/Lymphatic The patient has no complaints or symptoms. Respiratory The patient has no complaints or symptoms. Cardiovascular The patient has no complaints or symptoms. Gastrointestinal The patient has no complaints or symptoms. Endocrine The patient has no complaints or symptoms. Genitourinary The patient has no complaints or symptoms. Immunological The patient has no complaints or symptoms. Integumentary (Skin) The patient has no complaints or symptoms. Musculoskeletal The patient has no complaints or symptoms. Neurologic The patient has no complaints or symptoms. Oncologic Bladder Cancer Psychiatric The patient has no complaints or symptoms. Objective Constitutional Pulse regular. Respirations normal and unlabored. Afebrile. Vitals Time Taken: 10:24 AM, Height: 70 in, Source: Stated, Weight: 170 lbs, Source: Stated, BMI: 24.4, Temperature: 98.1 F, Pulse: 78 bpm, Respiratory Rate: 18 breaths/min, Blood Pressure: 146/101 mmHg. General Notes: Patient states he took medication this morning. Instructed to see PCP if BP continues to be high. MD Notified. Respiratory WNL. No retractions.. Breath sounds WNL, No rubs, rales, rhonchi, or wheeze.. Cardiovascular Heart rhythm and rate regular, no murmur or gallop.. Gastrointestinal (GI) Austin Contreras, Austin Contreras. ([id number]) Abdomen without masses or tenderness.. Neurological Sensation normal to touch,  pin,and vibration. Psychiatric Judgement and insight Intact.. Oriented times 3.. No evidence of depression, anxiety, or agitation.. Integumentary (Hair, Skin) No suspicious lesions. No crepitus or fluctuance. No peri-wound warmth or erythema. No masses.. Assessment Active Problems ICD-10 L59.8 - Other specified disorders of the skin and subcutaneous tissue related to radiation C67.9 - Malignant neoplasm of bladder, unspecified Z95.0 - Presence of cardiac pacemaker N30.41 - Irradiation cystitis with hematuria Radiation cystitis. Plan Hyperbaric Oxygen Therapy: Evaluate for HBO Therapy Indication: - Radiation Cystitis If appropriate for treatment, begin HBOT per protocol: One treatment per day (delivered Monday through Friday unless otherwise specified in Special Instructions below): Total # of Treatments: - 20 Austin Contreras, Austin Contreras. ([id number]) Obtain recent cystoscopy report and radiation records. His pacemaker will need to be cleared for HBO. He has a history of chest tube placement, and I have recommended PA and lateral chest x-ray prior to proceeding with HBO. He has had multiple echocardiograms in the past, and we will need his most recent echo results. Once these results are available, we will hopefully be able to initiate hyperbaric oxygen therapy for radiation cystitis. He is interested in proceeding as soon as possible. Electronic Signature(s) Signed: 11/08/2015 3:24:44 PM By: Loletha Grayer MD Entered By: Loletha Grayer on 11/08/2015 13:13:42 Austin Contreras, Austin Contreras. ([id number]) -------------------------------------------------------------------------------- ROS/PFSH Details Patient Name: Poulter, Bentleigh Contreras. Date of Service: 11/08/2015 10:00 AM Medical Record Number: [id number] Patient Account  Number: [id number] Date of Birth/Sex: 1926-10-02 (81 y.o. Male) Treating RN: Cornell Barman Primary Care Physician: Frazier Richards Other Clinician: Referring Physician: Maryan Puls Treating Physician/Extender: BURNS III, Charlean Sanfilippo in Treatment: 0 Label Progress Note Print Version as History and Physical for this encounter Information Obtained From Patient Wound History Do you currently have one or more open woundso No Have you tested positive for osteomyelitis (bone infection)o No Have you had any tests for circulation on your legso No Constitutional Symptoms (General Health) Complaints and Symptoms: No Complaints or Symptoms Medical History: Past Medical History Notes: Cancer removed from bladder. November 1st. Eyes Complaints and Symptoms: No Complaints or Symptoms Medical History: Positive for: Cataracts - both eyes removed 2years ago Negative for: Glaucoma; Optic Neuritis Ear/Nose/Mouth/Throat Medical History: Negative for: Chronic sinus problems/congestion; Middle ear problems Hematologic/Lymphatic Complaints and Symptoms: No Complaints or Symptoms Medical History: Positive for: Anemia Negative for: Hemophilia; Human Immunodeficiency Virus; Lymphedema; Sickle Cell Disease Respiratory Elahi, Ozil Contreras. ([id number]) Complaints and Symptoms: No Complaints or Symptoms Medical History: Negative for: Aspiration; Asthma; Chronic Obstructive Pulmonary Disease (COPD); Pneumothorax; Sleep Apnea; Tuberculosis Cardiovascular Complaints and Symptoms: No Complaints or Symptoms Medical History: Positive for: Arrhythmia; Hypertension Negative for: Angina; Congestive Heart Failure; Coronary Artery Disease; Deep Vein Thrombosis; Hypotension; Myocardial Infarction; Peripheral Arterial Disease; Peripheral Venous Disease; Phlebitis; Vasculitis Gastrointestinal Complaints and Symptoms: No Complaints or Symptoms Medical History: Negative for: Cirrhosis ; Colitis; Crohnos; Hepatitis A; Hepatitis Contreras; Hepatitis C Endocrine Complaints and Symptoms: No Complaints or Symptoms Medical History: Negative for: Type I Diabetes; Type II  Diabetes Genitourinary Complaints and Symptoms: No Complaints or Symptoms Medical History: Positive for: End Stage Renal Disease - stage 3 Immunological Complaints and Symptoms: No Complaints or Symptoms Medical History: Negative for: Lupus Erythematosus; Raynaudos; Scleroderma Integumentary (Skin) Hausner, Daylyn Contreras. ([id number]) Complaints and Symptoms: No Complaints or Symptoms Medical History: Negative for: History of Burn; History of pressure wounds Musculoskeletal Complaints and Symptoms: No Complaints or Symptoms Medical History: Negative for: Gout; Rheumatoid Arthritis; Osteoarthritis; Osteomyelitis Neurologic Complaints and Symptoms: No Complaints or Symptoms Medical History: Negative for: Dementia; Neuropathy; Quadriplegia; Paraplegia; Seizure Disorder Oncologic Complaints and Symptoms: Review of System Notes: Bladder Cancer Medical History: Positive for: Received Chemotherapy - wash in bladder after surgery; Received Radiation - 15 months ago Psychiatric Complaints and Symptoms: No Complaints or Symptoms Medical History: Negative for: Anorexia/bulimia; Confinement Anxiety HBO Extended History Items Eyes: Cataracts Hospitalization / Surgery History Name of Hospital Purpose of Hospitalization/Surgery Date [place] bladder surgery 10/17/2015 Family and Social History Cancer: Yes - Mother, Siblings; Diabetes: Yes - Siblings; Heart Disease: Yes - Father; Hereditary Spherocytosis: No; Hypertension: Yes - Father; Kidney Disease: Yes - Siblings; Lung Disease: Yes - Berryman, Demonta Contreras. ([id number]) Siblings; Seizures: No; Stroke: Yes - Father; Thyroid Problems: Yes - Siblings; Tuberculosis: No; Never smoker; Marital Status - Married; Alcohol Use: Rarely - beer; Drug Use: No History; Caffeine Use: Daily - coffee; Financial Concerns: No; Food, Clothing or Shelter Needs: No; Support System Lacking: No; Transportation Concerns: No; Advanced Directives: Yes (Not Provided); Patient  does not want information on Advanced Directives; Living Will: Yes (Not Provided) Physician Affirmation I have reviewed and agree with the above information. Electronic Signature(s) Signed: 11/08/2015 3:24:44 PM By: Loletha Grayer MD Signed: 11/08/2015 3:40:20 PM By: Gretta Cool RN, BSN, Kim RN, BSN Entered By: Loletha Grayer on 11/08/2015 12:53:59 Maple, Shondell Contreras. ([id number]) -------------------------------------------------------------------------------- SuperBill Details Patient Name: Senna, Vince Contreras. Date of Service: 11/08/2015 Medical Record Number: [id number] Patient Account Number: [id number] Date of  Birth/Sex: 02/10/1926 (81 y.o. Male) Treating RN: Cornell Barman Primary Care Physician: Frazier Richards Other Clinician: Referring Physician: Maryan Puls Treating Physician/Extender: BURNS III, Ronan Dion Weeks in Treatment: 0 Diagnosis Coding ICD-10 Codes Code Description L59.8 Other specified disorders of the skin and subcutaneous tissue related to radiation C67.9 Malignant neoplasm of bladder, unspecified Z95.0 Presence of cardiac pacemaker N30.41 Irradiation cystitis with hematuria Facility Procedures CPT4 Code: [id number] Description: 99213 - WOUND CARE VISIT-LEV 3 EST PT Modifier: Quantity: 1 Physician Procedures CPT4 Code: [id number] Description: [id number] - WC PHYS LEVEL 4 - NEW PT ICD-10 Description Diagnosis N30.41 Irradiation cystitis with hematuria Modifier: Quantity: 1 Electronic Signature(s) Signed: 11/08/2015 3:24:44 PM By: Loletha Grayer MD Entered By: Loletha Grayer on 11/08/2015 13:09:00

## 2015-11-09 NOTE — Progress Notes (Signed)
TYRONE, MESMER ([id number]) Visit Report for 11/08/2015 HBO Risk Assessment Details Patient Name: Austin Contreras, Austin B. Date of Service: 11/08/2015 10:00 AM Medical Record Number: [id number] Patient Account Number: [id number] Date of Birth/Sex: [birth year] (81 y.o. Male) Treating RN: Cornell Barman Primary Care Physician: Frazier Richards Other Clinician: Referring Physician: Maryan Puls Treating Physician/Extender: BURNS III, Charlean Sanfilippo in Treatment: 0 HBO Risk Assessment Items Answer Barotrauma Risks: Upper Respiratory Infections No Prior Radiation Treatment to Head/Neck No Tracheostomy No Ear problems or surgery (otosclerosis)- Consider pressure equalization tubes No Sinus Problems, Sinus Obstruction No Pulmonary Risks: Currently seeing a pulmonologisto No Emphysema No Pneumothorax No Tuberculosis No Other lung problems (COPD with CO2 retention, lesions, surgery) -Refer to No CPGs Congestive heart Failure -Consider holding HBO if ejection fraction<30% No History of smoking No Bullous Disease, Blebs No Other pulmonary abnormalities No Cardiac Risks: Currently seeing a cardiologisto Yes Pacemaker/AICD Yes [employer] if Yes for Pacemaker: Brand and Model [id number] Hypertension Yes Diuretic Used (water pill). If yes, last time taken: No History of prior or current malignancy (Cancer) Surgery Yes Radiation therapy Yes Chemotherapy Yes Contreras, Austin B. ([id number]) Ophthalmic Risks: Optic Neuritis No Cataracts No Myopia No Retinopathy or Retinal Detachment Surgery- Consider pressure equalization No tubes Confinement Anxiety Claustrophobia No Dialysis Dialysis No Pregnancy No Seizures Seizures No Currently using these medications: Aspirin No Digoxin (CHF patient) No Narcotics No Nitroprusside No Phenothiazine (Thorazine,etc.) No Prednisone or other steroids No Disulfiram (Antabuse) No Mafenide Acetate (Sulfamylon-burn cream) No Amiodarone No Electronic  Signature(s) Signed: 11/08/2015 3:40:20 PM By: Gretta Cool, RN, BSN, Kim RN, BSN Entered By: Gretta Cool, RN, BSN, Kim on 11/08/2015 11:11:25

## 2015-11-16 ENCOUNTER — Ambulatory Visit: Admit: 2015-11-16 | Payer: Self-pay | Admitting: Internal Medicine

## 2015-12-06 ENCOUNTER — Inpatient Hospital Stay: Payer: Medicare Other | Attending: Internal Medicine

## 2015-12-06 ENCOUNTER — Telehealth: Payer: Self-pay | Admitting: *Deleted

## 2015-12-06 ENCOUNTER — Inpatient Hospital Stay: Payer: Medicare Other

## 2015-12-06 ENCOUNTER — Inpatient Hospital Stay (HOSPITAL_BASED_OUTPATIENT_CLINIC_OR_DEPARTMENT_OTHER): Payer: Medicare Other | Admitting: Internal Medicine

## 2015-12-06 VITALS — BP 115/71 | HR 69 | Temp 98.5°F | Ht 70.0 in | Wt 171.5 lb

## 2015-12-06 DIAGNOSIS — K219 Gastro-esophageal reflux disease without esophagitis: Secondary | ICD-10-CM | POA: Insufficient documentation

## 2015-12-06 DIAGNOSIS — C679 Malignant neoplasm of bladder, unspecified: Secondary | ICD-10-CM | POA: Insufficient documentation

## 2015-12-06 DIAGNOSIS — E538 Deficiency of other specified B group vitamins: Secondary | ICD-10-CM

## 2015-12-06 DIAGNOSIS — Z951 Presence of aortocoronary bypass graft: Secondary | ICD-10-CM | POA: Insufficient documentation

## 2015-12-06 DIAGNOSIS — D472 Monoclonal gammopathy: Secondary | ICD-10-CM | POA: Insufficient documentation

## 2015-12-06 DIAGNOSIS — I2510 Atherosclerotic heart disease of native coronary artery without angina pectoris: Secondary | ICD-10-CM | POA: Insufficient documentation

## 2015-12-06 DIAGNOSIS — D649 Anemia, unspecified: Secondary | ICD-10-CM | POA: Insufficient documentation

## 2015-12-06 DIAGNOSIS — I10 Essential (primary) hypertension: Secondary | ICD-10-CM | POA: Insufficient documentation

## 2015-12-06 DIAGNOSIS — Z923 Personal history of irradiation: Secondary | ICD-10-CM | POA: Insufficient documentation

## 2015-12-06 DIAGNOSIS — Z79899 Other long term (current) drug therapy: Secondary | ICD-10-CM

## 2015-12-06 DIAGNOSIS — Z87891 Personal history of nicotine dependence: Secondary | ICD-10-CM | POA: Diagnosis not present

## 2015-12-06 DIAGNOSIS — E785 Hyperlipidemia, unspecified: Secondary | ICD-10-CM | POA: Diagnosis not present

## 2015-12-06 DIAGNOSIS — D519 Vitamin B12 deficiency anemia, unspecified: Secondary | ICD-10-CM

## 2015-12-06 LAB — BASIC METABOLIC PANEL
ANION GAP: 8 (ref 5–15)
BUN: 25 mg/dL — ABNORMAL HIGH (ref 6–20)
CALCIUM: 8.5 mg/dL — AB (ref 8.9–10.3)
CO2: 25 mmol/L (ref 22–32)
Chloride: 105 mmol/L (ref 101–111)
Creatinine, Ser: 1.77 mg/dL — ABNORMAL HIGH (ref 0.61–1.24)
GFR, EST AFRICAN AMERICAN: 38 mL/min — AB (ref >60)
GFR, EST NON AFRICAN AMERICAN: 33 mL/min — AB (ref >60)
GLUCOSE: 87 mg/dL (ref 65–99)
Potassium: 4.4 mmol/L (ref 3.5–5.1)
Sodium: 138 mmol/L (ref 135–145)

## 2015-12-06 LAB — CBC WITH DIFFERENTIAL/PLATELET
BASOS ABS: 0 10*3/uL (ref 0–0.1)
BASOS PCT: 1 %
EOS PCT: 3 %
Eosinophils Absolute: 0.2 10*3/uL (ref 0–0.7)
HCT: 36.6 % — ABNORMAL LOW (ref 40.0–52.0)
Hemoglobin: 12 g/dL — ABNORMAL LOW (ref 13.0–18.0)
Lymphocytes Relative: 12 %
Lymphs Abs: 0.8 10*3/uL — ABNORMAL LOW (ref 1.0–3.6)
MCH: 30.2 pg (ref 26.0–34.0)
MCHC: 32.7 g/dL (ref 32.0–36.0)
MCV: 92.5 fL (ref 80.0–100.0)
MONO ABS: 0.8 10*3/uL (ref 0.2–1.0)
Monocytes Relative: 12 %
Neutro Abs: 4.8 10*3/uL (ref 1.4–6.5)
Neutrophils Relative %: 72 %
PLATELETS: 162 10*3/uL (ref 150–440)
RBC: 3.95 MIL/uL — ABNORMAL LOW (ref 4.40–5.90)
RDW: 16.1 % — AB (ref 11.5–14.5)
WBC: 6.6 10*3/uL (ref 3.8–10.6)

## 2015-12-06 MED ORDER — CYANOCOBALAMIN 1000 MCG/ML IJ SOLN
1000.0000 ug | Freq: Once | INTRAMUSCULAR | Status: AC
  Administered 2015-12-06: 1000 ug via INTRAMUSCULAR
  Filled 2015-12-06: qty 1

## 2015-12-06 NOTE — Telephone Encounter (Signed)
I called to ask if Dr. Ouida Sills office to start giving b12 injections.  He is only coming to our office once a year and pt wanted to see if he can get b12 injections at [facility].  I have faxed request to see if they will take over injections.  He had one today in clinic

## 2015-12-06 NOTE — Progress Notes (Signed)
[place]  [phone]  [phone]     Austin Contreras DOB: 1926-06-18  MR#: [id number]  AYT#:[id number]  Patient Care Team: Kirk Ruths, MD as PCP - General (Internal Medicine)  CHIEF COMPLAINT:  Chief Complaint  Patient presents with  . Bladder Cancer  . 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 Patient is followed by Dr. Yves Dill, status post transurethral resection of bladder tumor with instillation of mitomycin. Patient underwent radiation therapy but decided against chemotherapy.  INTERVAL HISTORY:  Since his last appointment Mr. Austin Contreras has had TURP performed by Dr. Yves Dill in October 2016. He continues to complain of perineal discomfort and pain with urination, which is his chronic complaint. Otherwise, he is able to perform all activities of daily living although with difficulties.  REVIEW OF SYSTEMS:    Constitutional: Positive for malaise/fatigue. Negative for fever, chills, weight loss and diaphoresis.  HENT: Negative for congestion, ear discharge, ear pain, hearing loss, nosebleeds, sore throat and tinnitus.   Eyes: Negative for blurred vision, double vision, photophobia, pain, discharge and redness.  Respiratory: Negative for cough, hemoptysis, sputum production, shortness of breath, wheezing and stridor.   Cardiovascular: Negative for chest pain, palpitations, orthopnea, claudication, leg swelling and PND.  Gastrointestinal: Negative for heartburn, nausea, vomiting, abdominal pain, diarrhea, constipation, blood in stool and melena.  Genitourinary: Positive for hematuria.       Hematuria is intermittent and lasts less than 1 day, following with Dr. Yves Dill  Musculoskeletal: Negative.   Skin: Negative.   Neurological: Negative for dizziness, tingling, focal weakness, seizures and headaches.  Endo/Heme/Allergies: Does not bruise/bleed easily.  Psychiatric/Behavioral:  Negative for depression. The patient is not nervous/anxious and does not have insomnia.     As per HPI. Otherwise, a complete review of systems is negatve.  ONCOLOGY HISTORY:   Bladder cancer ([place])   12/01/2013 Initial Diagnosis Bladder cancer    PAST MEDICAL HISTORY: Past Medical History  Diagnosis Date  . B12 deficiency anemia 08/02/2015  . Bladder cancer ([place])   . GERD (gastroesophageal reflux disease)   . Hypertension   . PONV (postoperative nausea and vomiting)   . Hyperlipidemia   . Coronary artery disease     PAST SURGICAL HISTORY: Past Surgical History  Procedure Laterality Date  . Appendectomy    . Coronary artery bypass graft    . Cataracts    . Cataract extraction, bilateral    . Transurethral resection of bladder tumor N/A 10/17/2015    Procedure: TRANSURETHRAL RESECTION OF BLADDER TUMOR (TURBT);  Surgeon: Royston Cowper, MD;  Location: [facility] ORS;  Service: Urology;  Laterality: N/A;    FAMILY HISTORY Family History  Problem Relation Age of Onset  . Cancer Sister     GYNECOLOGIC HISTORY:  No LMP for male patient.     ADVANCED DIRECTIVES:    HEALTH MAINTENANCE: Social History  Substance Use Topics  . Smoking status: Former Smoker    Types: Cigarettes  . Smokeless tobacco: Never Used  . Alcohol Use: No     Colonoscopy:  PAP:  Bone density:  Lipid panel:  Allergies  Allergen Reactions  . Penicillins Rash    Current Outpatient Prescriptions  Medication Sig Dispense Refill  . acetaminophen-codeine (TYLENOL #3) 300-30 MG tablet Take 1-2 tablets by mouth every 4 (four) hours as needed for moderate pain. 30 tablet 2  . docusate sodium (COLACE) 100 MG capsule Take 2  capsules (200 mg total) by mouth 2 (two) times daily. 120 capsule 3  . enalapril (VASOTEC) 5 MG tablet     . furosemide (LASIX) 20 MG tablet Take by mouth.    . simvastatin (ZOCOR) 10 MG tablet Take 10 mg by mouth at bedtime.     . sotalol (BETAPACE) 80 MG tablet Take 80 mg by mouth  2 (two) times daily.     Marland Kitchen VITAMIN B1-B12 IJ Inject 1,000 mg as directed every 30 (thirty) days.     No current facility-administered medications for this visit.    OBJECTIVE: BP 115/71 mmHg  Pulse 69  Temp(Src) 98.5 F (36.9 C) (Tympanic)  Ht 5' 10"  (1.778 m)  Wt 171 lb 8.3 oz (77.8 kg)  BMI 24.61 kg/m2   Body mass index is 24.61 kg/(m^2).    ECOG FS:1 - Symptomatic but completely ambulatory  General: Well-developed, well-nourished, no acute distress. Eyes: Pink conjunctiva, anicteric sclera. HEENT: Normocephalic, moist mucous membranes, clear oropharnyx. Lungs: Clear to auscultation bilaterally. Heart: Paced. No rubs, murmurs, or gallops. Abdomen: Soft, nontender, nondistended. No organomegaly noted, normoactive bowel sounds. Musculoskeletal: No edema, cyanosis, or clubbing. Neuro: Alert, answering all questions appropriately. Cranial nerves grossly intact. Skin: No rashes or petechiae noted. Psych: Normal affect. Lymphatics: No cervical, calvicular, axillary or inguinal LAD.   LAB RESULTS:  Appointment on 12/06/2015  Component Date Value Ref Range Status  . WBC 12/06/2015 6.6  3.8 - 10.6 K/uL Final  . RBC 12/06/2015 3.95* 4.40 - 5.90 MIL/uL Final  . Hemoglobin 12/06/2015 12.0* 13.0 - 18.0 g/dL Final  . HCT 12/06/2015 36.6* 40.0 - 52.0 % Final  . MCV 12/06/2015 92.5  80.0 - 100.0 fL Final  . MCH 12/06/2015 30.2  26.0 - 34.0 pg Final  . MCHC 12/06/2015 32.7  32.0 - 36.0 g/dL Final  . RDW 12/06/2015 16.1* 11.5 - 14.5 % Final  . Platelets 12/06/2015 162  150 - 440 K/uL Final  . Neutrophils Relative % 12/06/2015 72   Final  . Neutro Abs 12/06/2015 4.8  1.4 - 6.5 K/uL Final  . Lymphocytes Relative 12/06/2015 12   Final  . Lymphs Abs 12/06/2015 0.8* 1.0 - 3.6 K/uL Final  . Monocytes Relative 12/06/2015 12   Final  . Monocytes Absolute 12/06/2015 0.8  0.2 - 1.0 K/uL Final  . Eosinophils Relative 12/06/2015 3   Final  . Eosinophils Absolute 12/06/2015 0.2  0 - 0.7 K/uL Final   . Basophils Relative 12/06/2015 1   Final  . Basophils Absolute 12/06/2015 0.0  0 - 0.1 K/uL Final  . Sodium 12/06/2015 138  135 - 145 mmol/L Final  . Potassium 12/06/2015 4.4  3.5 - 5.1 mmol/L Final  . Chloride 12/06/2015 105  101 - 111 mmol/L Final  . CO2 12/06/2015 25  22 - 32 mmol/L Final  . Glucose, Bld 12/06/2015 87  65 - 99 mg/dL Final  . BUN 12/06/2015 25* 6 - 20 mg/dL Final  . Creatinine, Ser 12/06/2015 1.77* 0.61 - 1.24 mg/dL Final  . Calcium 12/06/2015 8.5* 8.9 - 10.3 mg/dL Final  . GFR calc non Af Amer 12/06/2015 33* >60 mL/min Final  . GFR calc Af Amer 12/06/2015 38* >60 mL/min Final   Comment: (NOTE) The eGFR has been calculated using the CKD EPI equation. This calculation has not been validated in all clinical situations. eGFR's persistently <60 mL/min signify possible Chronic Kidney Disease.   . Anion gap 12/06/2015 8  5 - 15 Final    STUDIES: No  results found.  ASSESSMENT:   B12 deficiency   bladder cancer  PLAN:    1. B12 deficiency. Patient has been on monthly vitamin B12 injections, but he would prefer to have those injections administered at the local PCP office, which is a fair request. We will communicate with the PCPs office to ensure continued care of care there.   2. Bladder cancer.  Patient with high-grade transitional cell carcinoma with lymphovascular invasion as well as invasion of the side of epithelial connective tissue. Patient is status post TURBT and XRT.  Previously decided not to pursue treatment with platinum  Chemotherapy.he has been followed by Dr. Yves Dill, and had another cystoscopy and TURP in October 2016. This procedure revealed presence of non-invasive superficial bladder cancer. No chemotherapy or radiation is required to this point. He will continue to be monitored by Dr. Yves Dill on the regular basis Unfortunately, he continues to have symptoms of perineal pain and dysuria, which she attributes to TURP procedure.  3. IgG kappa monoclonal  gammopathy of undetermined significance-Mr. Mierzejewski has been under observation for small amount of IgG kappa monoclonal protein for the past 3 years. Over this period of time there has been no significant change in the amount of protein, so we will continue to monitor it on annual basis.   We will have patient return monthly for B12 injections and again in 4 months for further follow-up with provider. Patient advised to keep all follow-up with Dr. Yves Dill as scheduled.  Patient expressed understanding and was in agreement with this plan. He also understands that He can call clinic at any time with any questions, concerns, or complaints.      Roxana Hires, MD   12/06/2015 11:16 AM

## 2015-12-07 LAB — PROTEIN ELECTROPHORESIS, SERUM
A/G RATIO SPE: 1.2 (ref 0.7–1.7)
ALBUMIN ELP: 3.8 g/dL (ref 2.9–4.4)
ALPHA-1-GLOBULIN: 0.2 g/dL (ref 0.0–0.4)
ALPHA-2-GLOBULIN: 0.8 g/dL (ref 0.4–1.0)
BETA GLOBULIN: 1 g/dL (ref 0.7–1.3)
Gamma Globulin: 1.1 g/dL (ref 0.4–1.8)
Globulin, Total: 3.2 g/dL (ref 2.2–3.9)
M-Spike, %: 0.6 g/dL — ABNORMAL HIGH
Total Protein ELP: 7 g/dL (ref 6.0–8.5)

## 2015-12-08 ENCOUNTER — Telehealth: Payer: Self-pay | Admitting: *Deleted

## 2015-12-08 NOTE — Telephone Encounter (Signed)
Called [facility] and they did make appt for pt to start getting b12 injections at their clinic.  They did try to call pt and got voicemail and also mailed pt appt. Card.  Called pt's home and spoke to wife and tol dher about above and if they don't recceive appt in mail to let us know and she will .

## 2015-12-22 ENCOUNTER — Other Ambulatory Visit: Payer: Self-pay | Admitting: Internal Medicine

## 2015-12-22 ENCOUNTER — Encounter: Payer: Self-pay | Admitting: Emergency Medicine

## 2015-12-22 ENCOUNTER — Inpatient Hospital Stay
Admission: EM | Admit: 2015-12-22 | Discharge: 2016-01-01 | DRG: 699 | Disposition: A | Discharge status: Home / Self Care | Payer: Medicare Other | Attending: Internal Medicine | Admitting: Internal Medicine

## 2015-12-22 DIAGNOSIS — K219 Gastro-esophageal reflux disease without esophagitis: Secondary | ICD-10-CM | POA: Diagnosis present

## 2015-12-22 DIAGNOSIS — Z87891 Personal history of nicotine dependence: Secondary | ICD-10-CM

## 2015-12-22 DIAGNOSIS — Z87442 Personal history of urinary calculi: Secondary | ICD-10-CM

## 2015-12-22 DIAGNOSIS — N3041 Irradiation cystitis with hematuria: Principal | ICD-10-CM | POA: Diagnosis present

## 2015-12-22 DIAGNOSIS — Z952 Presence of prosthetic heart valve: Secondary | ICD-10-CM

## 2015-12-22 DIAGNOSIS — N183 Chronic kidney disease, stage 3 (moderate): Secondary | ICD-10-CM | POA: Diagnosis present

## 2015-12-22 DIAGNOSIS — N136 Pyonephrosis: Secondary | ICD-10-CM | POA: Diagnosis present

## 2015-12-22 DIAGNOSIS — D649 Anemia, unspecified: Secondary | ICD-10-CM | POA: Diagnosis not present

## 2015-12-22 DIAGNOSIS — Z923 Personal history of irradiation: Secondary | ICD-10-CM

## 2015-12-22 DIAGNOSIS — Z8249 Family history of ischemic heart disease and other diseases of the circulatory system: Secondary | ICD-10-CM

## 2015-12-22 DIAGNOSIS — D519 Vitamin B12 deficiency anemia, unspecified: Secondary | ICD-10-CM | POA: Diagnosis present

## 2015-12-22 DIAGNOSIS — D62 Acute posthemorrhagic anemia: Secondary | ICD-10-CM | POA: Insufficient documentation

## 2015-12-22 DIAGNOSIS — I129 Hypertensive chronic kidney disease with stage 1 through stage 4 chronic kidney disease, or unspecified chronic kidney disease: Secondary | ICD-10-CM | POA: Diagnosis present

## 2015-12-22 DIAGNOSIS — R748 Abnormal levels of other serum enzymes: Secondary | ICD-10-CM

## 2015-12-22 DIAGNOSIS — Z807 Family history of other malignant neoplasms of lymphoid, hematopoietic and related tissues: Secondary | ICD-10-CM

## 2015-12-22 DIAGNOSIS — Z951 Presence of aortocoronary bypass graft: Secondary | ICD-10-CM

## 2015-12-22 DIAGNOSIS — Z880 Allergy status to penicillin: Secondary | ICD-10-CM | POA: Diagnosis not present

## 2015-12-22 DIAGNOSIS — K627 Radiation proctitis: Secondary | ICD-10-CM | POA: Diagnosis present

## 2015-12-22 DIAGNOSIS — R0902 Hypoxemia: Secondary | ICD-10-CM | POA: Diagnosis present

## 2015-12-22 DIAGNOSIS — I2510 Atherosclerotic heart disease of native coronary artery without angina pectoris: Secondary | ICD-10-CM | POA: Diagnosis present

## 2015-12-22 DIAGNOSIS — Z950 Presence of cardiac pacemaker: Secondary | ICD-10-CM | POA: Diagnosis not present

## 2015-12-22 DIAGNOSIS — R531 Weakness: Secondary | ICD-10-CM | POA: Diagnosis present

## 2015-12-22 DIAGNOSIS — Z808 Family history of malignant neoplasm of other organs or systems: Secondary | ICD-10-CM

## 2015-12-22 DIAGNOSIS — J90 Pleural effusion, not elsewhere classified: Secondary | ICD-10-CM | POA: Diagnosis present

## 2015-12-22 DIAGNOSIS — N3289 Other specified disorders of bladder: Secondary | ICD-10-CM | POA: Diagnosis present

## 2015-12-22 DIAGNOSIS — C679 Malignant neoplasm of bladder, unspecified: Secondary | ICD-10-CM | POA: Diagnosis not present

## 2015-12-22 DIAGNOSIS — R102 Pelvic and perineal pain: Secondary | ICD-10-CM | POA: Diagnosis present

## 2015-12-22 DIAGNOSIS — E785 Hyperlipidemia, unspecified: Secondary | ICD-10-CM | POA: Diagnosis present

## 2015-12-22 DIAGNOSIS — R1084 Generalized abdominal pain: Secondary | ICD-10-CM

## 2015-12-22 DIAGNOSIS — Z8551 Personal history of malignant neoplasm of bladder: Secondary | ICD-10-CM

## 2015-12-22 DIAGNOSIS — I482 Chronic atrial fibrillation: Secondary | ICD-10-CM | POA: Diagnosis present

## 2015-12-22 DIAGNOSIS — R319 Hematuria, unspecified: Secondary | ICD-10-CM | POA: Diagnosis present

## 2015-12-22 DIAGNOSIS — Y842 Radiological procedure and radiotherapy as the cause of abnormal reaction of the patient, or of later complication, without mention of misadventure at the time of the procedure: Secondary | ICD-10-CM | POA: Diagnosis present

## 2015-12-22 DIAGNOSIS — Z953 Presence of xenogenic heart valve: Secondary | ICD-10-CM | POA: Diagnosis not present

## 2015-12-22 DIAGNOSIS — N179 Acute kidney failure, unspecified: Secondary | ICD-10-CM | POA: Diagnosis present

## 2015-12-22 DIAGNOSIS — Z823 Family history of stroke: Secondary | ICD-10-CM

## 2015-12-22 DIAGNOSIS — N1330 Unspecified hydronephrosis: Secondary | ICD-10-CM

## 2015-12-22 DIAGNOSIS — Z436 Encounter for attention to other artificial openings of urinary tract: Secondary | ICD-10-CM | POA: Insufficient documentation

## 2015-12-22 HISTORY — DX: Supraventricular tachycardia: I47.1

## 2015-12-22 HISTORY — DX: Chronic kidney disease, stage 3 (moderate): N18.3

## 2015-12-22 HISTORY — DX: Presence of xenogenic heart valve: Z95.3

## 2015-12-22 HISTORY — DX: Chronic kidney disease, stage 3 unspecified: N18.30

## 2015-12-22 HISTORY — DX: Supraventricular tachycardia, unspecified: I47.10

## 2015-12-22 HISTORY — DX: Benign prostatic hyperplasia without lower urinary tract symptoms: N40.0

## 2015-12-22 LAB — COMPREHENSIVE METABOLIC PANEL
ALK PHOS: 104 U/L (ref 38–126)
ALT: 124 U/L — AB (ref 17–63)
ANION GAP: 9 (ref 5–15)
AST: 40 U/L (ref 15–41)
Albumin: 3.7 g/dL (ref 3.5–5.0)
BUN: 41 mg/dL — ABNORMAL HIGH (ref 6–20)
CALCIUM: 8.4 mg/dL — AB (ref 8.9–10.3)
CO2: 23 mmol/L (ref 22–32)
CREATININE: 1.99 mg/dL — AB (ref 0.61–1.24)
Chloride: 106 mmol/L (ref 101–111)
GFR, EST AFRICAN AMERICAN: 33 mL/min — AB (ref >60)
GFR, EST NON AFRICAN AMERICAN: 28 mL/min — AB (ref >60)
Glucose, Bld: 99 mg/dL (ref 65–99)
Potassium: 4.3 mmol/L (ref 3.5–5.1)
Sodium: 138 mmol/L (ref 135–145)
TOTAL PROTEIN: 6.9 g/dL (ref 6.5–8.1)
Total Bilirubin: 1.7 mg/dL — ABNORMAL HIGH (ref 0.3–1.2)

## 2015-12-22 LAB — LIPASE, BLOOD: Lipase: 65 U/L — ABNORMAL HIGH (ref 11–51)

## 2015-12-22 LAB — CBC
HEMATOCRIT: 28.6 % — AB (ref 40.0–52.0)
HEMOGLOBIN: 9.5 g/dL — AB (ref 13.0–18.0)
MCH: 30.5 pg (ref 26.0–34.0)
MCHC: 33.2 g/dL (ref 32.0–36.0)
MCV: 91.9 fL (ref 80.0–100.0)
Platelets: 155 10*3/uL (ref 150–440)
RBC: 3.11 MIL/uL — AB (ref 4.40–5.90)
RDW: 16.2 % — ABNORMAL HIGH (ref 11.5–14.5)
WBC: 5.9 10*3/uL (ref 3.8–10.6)

## 2015-12-22 LAB — URINALYSIS COMPLETE WITH MICROSCOPIC (ARMC ONLY)
BACTERIA UA: NONE SEEN
SQUAMOUS EPITHELIAL / LPF: NONE SEEN
Specific Gravity, Urine: 1.018 (ref 1.005–1.030)

## 2015-12-22 LAB — PROTIME-INR
INR: 1.12
PROTHROMBIN TIME: 14.6 s (ref 11.4–15.0)

## 2015-12-22 LAB — APTT: APTT: 36 s (ref 24–36)

## 2015-12-22 LAB — ABO/RH: ABO/RH(D): AB POS

## 2015-12-22 LAB — HEMOGLOBIN: HEMOGLOBIN: 10.5 g/dL — AB (ref 13.0–18.0)

## 2015-12-22 LAB — PREPARE RBC (CROSSMATCH)

## 2015-12-22 MED ORDER — SOTALOL HCL 80 MG PO TABS
80.0000 mg | ORAL_TABLET | Freq: Two times a day (BID) | ORAL | Status: DC
  Administered 2015-12-22 – 2016-01-01 (×20): 80 mg via ORAL
  Filled 2015-12-22 (×22): qty 1

## 2015-12-22 MED ORDER — BELLADONNA ALKALOIDS-OPIUM 16.2-60 MG RE SUPP
1.0000 | Freq: Four times a day (QID) | RECTAL | Status: DC | PRN
  Administered 2015-12-22 – 2015-12-29 (×14): 1 via RECTAL
  Filled 2015-12-22 (×15): qty 1

## 2015-12-22 MED ORDER — SIMVASTATIN 10 MG PO TABS
10.0000 mg | ORAL_TABLET | Freq: Every day | ORAL | Status: DC
  Administered 2015-12-22 – 2015-12-31 (×10): 10 mg via ORAL
  Filled 2015-12-22 (×10): qty 1

## 2015-12-22 MED ORDER — SODIUM CHLORIDE 0.9 % IJ SOLN
3.0000 mL | Freq: Two times a day (BID) | INTRAMUSCULAR | Status: DC
  Administered 2015-12-24 – 2016-01-01 (×12): 3 mL via INTRAVENOUS

## 2015-12-22 MED ORDER — ONDANSETRON HCL 4 MG PO TABS: 4.0000 mg | ORAL_TABLET | Freq: Four times a day (QID) | ORAL | Status: DC | PRN

## 2015-12-22 MED ORDER — SODIUM CHLORIDE 0.9 % IV SOLN
10.0000 mL/h | Freq: Once | INTRAVENOUS | Status: AC
  Administered 2015-12-22: 10 mL/h via INTRAVENOUS

## 2015-12-22 MED ORDER — SODIUM CHLORIDE 0.9 % IV SOLN
INTRAVENOUS | Status: DC
  Administered 2015-12-23 (×2): via INTRAVENOUS

## 2015-12-22 MED ORDER — HYDROCORTISONE ACETATE 25 MG RE SUPP
25.0000 mg | Freq: Two times a day (BID) | RECTAL | Status: DC | PRN
  Filled 2015-12-22: qty 1

## 2015-12-22 MED ORDER — POLYETHYLENE GLYCOL 3350 17 G PO PACK
17.0000 g | PACK | Freq: Every day | ORAL | Status: DC | PRN
Start: 2015-12-22 — End: 2015-12-29
  Filled 2015-12-22: qty 1

## 2015-12-22 MED ORDER — ACETAMINOPHEN 325 MG PO TABS: 650.0000 mg | ORAL_TABLET | Freq: Four times a day (QID) | ORAL | Status: DC | PRN

## 2015-12-22 MED ORDER — PANTOPRAZOLE SODIUM 40 MG PO TBEC
40.0000 mg | DELAYED_RELEASE_TABLET | Freq: Every day | ORAL | Status: DC
  Administered 2015-12-23 – 2016-01-01 (×8): 40 mg via ORAL
  Filled 2015-12-22 (×8): qty 1

## 2015-12-22 MED ORDER — ONDANSETRON HCL 4 MG/2ML IJ SOLN
4.0000 mg | Freq: Four times a day (QID) | INTRAMUSCULAR | Status: DC | PRN
  Administered 2015-12-26: 4 mg via INTRAVENOUS
  Filled 2015-12-22: qty 2

## 2015-12-22 MED ORDER — OXYCODONE HCL 5 MG PO TABS
5.0000 mg | ORAL_TABLET | ORAL | Status: DC | PRN
  Administered 2015-12-22 – 2016-01-01 (×21): 5 mg via ORAL
  Filled 2015-12-22 (×21): qty 1

## 2015-12-22 MED ORDER — DOCUSATE SODIUM 100 MG PO CAPS
200.0000 mg | ORAL_CAPSULE | Freq: Two times a day (BID) | ORAL | Status: DC
  Administered 2015-12-22 – 2015-12-25 (×6): 200 mg via ORAL
  Filled 2015-12-22 (×4): qty 2
  Filled 2015-12-22: qty 1
  Filled 2015-12-22 (×2): qty 2

## 2015-12-22 MED ORDER — ACETAMINOPHEN 650 MG RE SUPP: 650.0000 mg | Freq: Four times a day (QID) | RECTAL | Status: DC | PRN

## 2015-12-22 NOTE — ED Provider Notes (Signed)
[facility] Emergency Department Provider Note  ____________________________________________  Time seen: Approximately 1:48 PM  I have reviewed the triage vital signs and the nursing notes.   HISTORY  Chief Complaint Hematuria    HPI Austin Contreras is a 82 y.o. male with history of bladder cancer, hypertension, hyponatremia, coronary artery disease who presents from his primary care doctor's office for anemia and fatigue, gradual onset over the past few weeks, constant since onset, currently moderate to severe. The patient reports that he is urinating frank blood and has been doing so for several weeks. No abdominal pain, no chest pain, no difficulty breathing. No nausea, vomiting, diarrhea, fevers or chills. He denies any blood in his stools. Denies any dark or tarry stools.  Past Medical History  Diagnosis Date  . B12 deficiency anemia 08/02/2015  . Bladder cancer ([place])     stage 2 s/p TUBTR  . GERD (gastroesophageal reflux disease)   . Hypertension   . PONV (postoperative nausea and vomiting)   . Hyperlipidemia   . Coronary artery disease   . H/O aortic valve replacement with porcine valve   . BPH (benign prostatic hyperplasia)   . A-fib ([place])   . SVT (supraventricular tachycardia) ([place])   . CKD (chronic kidney disease) stage 3, GFR 30-59 ml/min     Patient Active Problem List   Diagnosis Date Noted  . Hematuria 12/22/2015  . B12 deficiency anemia 08/02/2015  . Bladder cancer ([place]) 08/02/2015    Past Surgical History  Procedure Laterality Date  . Appendectomy    . Coronary artery bypass graft    . Cataracts    . Cataract extraction, bilateral    . Transurethral resection of bladder tumor N/A 10/17/2015    Procedure: TRANSURETHRAL RESECTION OF BLADDER TUMOR (TURBT);  Surgeon: Royston Cowper, MD;  Location: [facility] ORS;  Service: Urology;  Laterality: N/A;  . Aortic valve replacement    . Pacemaker insertion    . Hand surgery      after trauma   . Rotator cuff repair      right shoulder  . Prepatellar bursa excision      Current Outpatient Rx  Name  Route  Sig  Dispense  Refill  . acetaminophen-codeine (TYLENOL #3) 300-30 MG tablet   Oral   Take 1-2 tablets by mouth every 4 (four) hours as needed for moderate pain.   30 tablet   2   . cyanocobalamin (,VITAMIN B-12,) 1000 MCG/ML injection   Intramuscular   Inject 1,000 mcg into the muscle every 30 (thirty) days.         Marland Kitchen docusate sodium (COLACE) 100 MG capsule   Oral   Take 2 capsules (200 mg total) by mouth 2 (two) times daily. Patient taking differently: Take 200 mg by mouth 2 (two) times daily as needed for mild constipation.    120 capsule   3   . enalapril (VASOTEC) 5 MG tablet   Oral   Take 5 mg by mouth daily.          . hydrocortisone (ANUSOL-HC) 25 MG suppository   Rectal   Place 25 mg rectally 2 (two) times daily as needed for hemorrhoids.         . Methen-Hyosc-Meth Blue-Na Phos (UROGESIC-BLUE) 81.6 MG TABS   Oral   Take 1 tablet by mouth every 6 (six) hours as needed (for urinary burning).         Marland Kitchen omeprazole (PRILOSEC) 20 MG capsule  Oral   Take 20 mg by mouth daily.         . simvastatin (ZOCOR) 10 MG tablet   Oral   Take 10 mg by mouth at bedtime.          . sotalol (BETAPACE) 80 MG tablet   Oral   Take 80 mg by mouth 2 (two) times daily.            Allergies Penicillins  Family History  Problem Relation Age of Onset  . Lymphoma Sister   . Bone cancer Mother   . CAD Father   . CVA Father     Social History Social History  Substance Use Topics  . Smoking status: Former Smoker    Types: Cigarettes  . Smokeless tobacco: Never Used     Comment: Quit in 1999  . Alcohol Use: No    Review of Systems Constitutional: No fever/chills Eyes: No visual changes. ENT: No sore throat. Cardiovascular: Denies chest pain. Respiratory: Denies shortness of breath. Gastrointestinal: No abdominal pain.  No nausea, no  vomiting.  No diarrhea.  No constipation. Genitourinary: Positive for hematuria. Musculoskeletal: Negative for back pain. Skin: Negative for rash. Neurological: Negative for headaches, focal weakness or numbness.  10-point ROS otherwise negative.  ____________________________________________   PHYSICAL EXAM:  VITAL SIGNS: ED Triage Vitals  Enc Vitals Group     BP 12/22/15 1048 117/72 mmHg     Pulse Rate 12/22/15 1048 71     Resp 12/22/15 1048 18     Temp 12/22/15 1048 97.9 F (36.6 C)     Temp Source 12/22/15 1048 Oral     SpO2 12/22/15 1048 99 %     Weight 12/22/15 1048 171 lb (77.565 kg)     Height 12/22/15 1048 5\' 10"  (1.778 m)     Head Cir --      Peak Flow --      Pain Score 12/22/15 1050 6     Pain Loc --      Pain Edu? --      Excl. in [place]? --     Constitutional: Alert and oriented. Nontoxic appearing and in no acute distress. Eyes: Conjunctivae are normal. PERRL. EOMI. Head: Atraumatic. Nose: No congestion/rhinnorhea. Mouth/Throat: Mucous membranes are moist.  Oropharynx non-erythematous. Neck: No stridor.  Cardiovascular: Normal rate, regular rhythm. Grossly normal heart sounds.  Good peripheral circulation. Respiratory: Normal respiratory effort.  No retractions. Lungs CTAB. Gastrointestinal: Soft and nontender. No distention. No CVA tenderness. Genitourinary: no active penile bleeding Rectal: Brown Stool in rectal vault is guaiac negative. Musculoskeletal: No lower extremity tenderness nor edema.  No joint effusions. Neurologic:  Normal speech and language. No gross focal neurologic deficits are appreciated. No gait instability. Skin:  Skin is warm, dry and intact. No rash noted. Psychiatric: Mood and affect are normal. Speech and behavior are normal.  ____________________________________________   LABS (all labs ordered are listed, but only abnormal results are displayed)  Labs Reviewed  CBC - Abnormal; Notable for the following:    RBC 3.11 (*)     Hemoglobin 9.5 (*)    HCT 28.6 (*)    RDW 16.2 (*)    All other components within normal limits  LIPASE, BLOOD - Abnormal; Notable for the following:    Lipase 65 (*)    All other components within normal limits  COMPREHENSIVE METABOLIC PANEL - Abnormal; Notable for the following:    BUN 41 (*)    Creatinine, Ser 1.99 (*)  Calcium 8.4 (*)    ALT 124 (*)    Total Bilirubin 1.7 (*)    GFR calc non Af Amer 28 (*)    GFR calc Af Amer 33 (*)    All other components within normal limits  URINALYSIS COMPLETEWITH MICROSCOPIC ([facility] ONLY)  PROTIME-INR  APTT  TYPE AND SCREEN  PREPARE RBC (CROSSMATCH)  ABO/RH   ____________________________________________  EKG  none ____________________________________________  RADIOLOGY  none ____________________________________________   PROCEDURES  Procedure(s) performed: None  Critical Care performed: No.  ____________________________________________   INITIAL IMPRESSION / ASSESSMENT AND PLAN / ED COURSE  Pertinent labs & imaging results that were available during my care of the patient were reviewed by me and considered in my medical decision making (see chart for details).  Austin Contreras is a 82 y.o. male with history of bladder cancer, hypertension, hyponatremia, coronary artery disease who presents from his primary care doctor's office for anemia and fatigue as well as frank hematuria. On exam, he is nontoxic appearing and in no acute distress. Vital signs are stable, he is afebrile. Labs reviewed and are notable for nearly 3 point drop in his hemoglobin over the past 2 weeks. Hemoglobin today is 9.5. Creatinine is mildly elevated at 1.99. Patient continues to have frank hematuria. He has been consented for blood. We'll transfuse. Case discussed with hospitalist for admission at 2:45 PM. Case discussed with Dr. Yves Dill of urology who has no additional reccommendations at this  time.    ____________________________________________   FINAL CLINICAL IMPRESSION(S) / ED DIAGNOSES  Final diagnoses:  Hematuria  Acute blood loss anemia      Joanne Gavel, MD 12/22/15 1511

## 2015-12-22 NOTE — H&P (Signed)
[facility] [place] NAME: Stedman Tori    MR#:  [id number]  DATE OF BIRTH:  November 27, 1926  DATE OF ADMISSION:  12/22/2015  PRIMARY CARE PHYSICIAN: [name], MD   REQUESTING/REFERRING PHYSICIAN: Dr. Loura Pardon  CHIEF COMPLAINT:   Chief Complaint  Patient presents with  . Hematuria    HISTORY OF PRESENT ILLNESS:  Austin Contreras  is a 82 y.o. 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 Since his bladder tumor resection in October 2016, patient has been having hematuria. But it hasn't been consistent enough. Occasionally he has dark blood mixed with urine, and the hematuria used to clear by itself. However over the last couple of weeks he has been passing bright red blood, need to use the bathroom every 30 minutes overnight. He went to see his PCP 2 days ago and had routine blood work done. His hemoglobin dropped from 12 to 9 and was advised to come to the emergency room. Patient at baseline is very active, independent and does a lot of work at home. But lately has been complaining of more fatigue, chills. Denies any nausea, vomiting, no melena or blood in stool. No chest pain or breathing problems.  PAST MEDICAL HISTORY:   Past Medical History  Diagnosis Date  . B12 deficiency anemia 08/02/2015  . Bladder cancer ([place])     stage 2 s/p TUBTR  . GERD (gastroesophageal reflux disease)   . Hypertension   . PONV (postoperative nausea and vomiting)   . Hyperlipidemia   . Coronary artery disease   . H/O aortic valve replacement with porcine valve   . BPH (benign prostatic hyperplasia)   . A-fib ([place])   . SVT (supraventricular tachycardia) ([place])   . CKD (chronic kidney disease) stage 3, GFR 30-59 ml/min     PAST SURGICAL HISTORY:   Past Surgical History  Procedure  Laterality Date  . Appendectomy    . Coronary artery bypass graft    . Cataracts    . Cataract extraction, bilateral    . Transurethral resection of bladder tumor N/A 10/17/2015    Procedure: TRANSURETHRAL RESECTION OF BLADDER TUMOR (TURBT);  Surgeon: Royston Cowper, MD;  Location: [facility] ORS;  Service: Urology;  Laterality: N/A;  . Aortic valve replacement    . Pacemaker insertion    . Hand surgery      after trauma  . Rotator cuff repair      right shoulder  . Prepatellar bursa excision      SOCIAL HISTORY:   Social History  Substance Use Topics  . Smoking status: Former Smoker    Types: Cigarettes  . Smokeless tobacco: Never Used     Comment: Quit in 1999  . Alcohol Use: No    FAMILY HISTORY:   Family History  Problem Relation Age of Onset  . Lymphoma Sister   . Bone cancer Mother   . CAD Father   . CVA Father     DRUG ALLERGIES:   Allergies  Allergen Reactions  . Penicillins Rash and Other (See Comments)    Has patient had a PCN reaction causing immediate rash, facial/tongue/throat swelling, SOB or lightheadedness with hypotension: No Has patient had a PCN reaction causing severe rash involving mucus membranes or skin necrosis: No Has patient had a PCN reaction  that required hospitalization No Has patient had a PCN reaction occurring within the last 10 years: No If all of the above answers are "NO", then may proceed with Cephalosporin use.    REVIEW OF SYSTEMS:   Review of Systems  Constitutional: Positive for malaise/fatigue. Negative for fever, chills and weight loss.  HENT: Negative for ear discharge, ear pain, nosebleeds and tinnitus.   Eyes: Negative for blurred vision, double vision and photophobia.  Respiratory: Negative for cough, hemoptysis, shortness of breath and wheezing.   Cardiovascular: Negative for chest pain, palpitations, orthopnea and leg swelling.  Gastrointestinal: Negative for nausea, vomiting, abdominal pain, diarrhea, constipation and  melena.  Genitourinary: Positive for dysuria and hematuria. Negative for urgency and frequency.  Musculoskeletal: Negative for myalgias, back pain and neck pain.  Skin: Negative for rash.  Neurological: Negative for dizziness, tremors, sensory change, speech change, focal weakness and headaches.  Endo/Heme/Allergies: Does not bruise/bleed easily.  Psychiatric/Behavioral: Negative for depression.    MEDICATIONS AT HOME:   Prior to Admission medications   Medication Sig Start Date End Date Taking? Authorizing Provider  acetaminophen-codeine (TYLENOL #3) 300-30 MG tablet Take 1-2 tablets by mouth every 4 (four) hours as needed for moderate pain. 10/17/15  Yes Royston Cowper, MD  cyanocobalamin (,VITAMIN B-12,) 1000 MCG/ML injection Inject 1,000 mcg into the muscle every 30 (thirty) days.   Yes Historical Provider, MD  enalapril (VASOTEC) 5 MG tablet Take 5 mg by mouth daily.    Yes Historical Provider, MD  hydrocortisone (ANUSOL-HC) 25 MG suppository Place 25 mg rectally 2 (two) times daily as needed for hemorrhoids.   Yes Historical Provider, MD  Methen-Hyosc-Meth Blue-Na Phos (UROGESIC-BLUE) 81.6 MG TABS Take 1 tablet by mouth every 6 (six) hours as needed (for urinary burning).   Yes Historical Provider, MD  omeprazole (PRILOSEC) 20 MG capsule Take 20 mg by mouth daily.   Yes Historical Provider, MD  simvastatin (ZOCOR) 10 MG tablet Take 10 mg by mouth at bedtime.    Yes Historical Provider, MD  sotalol (BETAPACE) 80 MG tablet Take 80 mg by mouth 2 (two) times daily.    Yes Historical Provider, MD  docusate sodium (COLACE) 100 MG capsule Take 2 capsules (200 mg total) by mouth 2 (two) times daily. 10/17/15   Royston Cowper, MD      VITAL SIGNS:  Blood pressure 113/81, pulse 70, temperature 97.9 F (36.6 C), temperature source Oral, resp. rate 18, height 5\' 10"  (1.778 m), weight 77.565 kg (171 lb), SpO2 99 %.  PHYSICAL EXAMINATION:   Physical Exam  GENERAL:  82 y.o.-year-old patient  lying in the bed with no acute distress.  EYES: Pupils equal, round, reactive to light and accommodation. No scleral icterus. Extraocular muscles intact.  HEENT: Head atraumatic, normocephalic. Oropharynx and nasopharynx clear.  NECK:  Supple, no jugular venous distention. No thyroid enlargement, no tenderness.  LUNGS: Normal breath sounds bilaterally, no wheezing, rales,rhonchi or crepitation. No use of accessory muscles of respiration. Decreased bibasilar breath sounds. CARDIOVASCULAR: S1, S2 normal. No  rubs, or gallops. Loud 3/6 systolic murmur present ABDOMEN: Soft, nontender, nondistended. Bowel sounds present. No organomegaly or mass.  EXTREMITIES: No pedal edema, cyanosis, or clubbing.  GENITOURINARY: no scrotal swelling, pads in place and blood noted. NEUROLOGIC: Cranial nerves II through XII are intact. Muscle strength 5/5 in all extremities. Sensation intact. Gait not checked.  PSYCHIATRIC: The patient is alert and oriented x 3.  SKIN: No obvious rash, lesion, or ulcer.   LABORATORY PANEL:  CBC  Recent Labs Lab 12/22/15 1055  WBC 5.9  HGB 9.5*  HCT 28.6*  PLT 155   ------------------------------------------------------------------------------------------------------------------  Chemistries   Recent Labs Lab 12/22/15 1055  NA 138  K 4.3  CL 106  CO2 23  GLUCOSE 99  BUN 41*  CREATININE 1.99*  CALCIUM 8.4*  AST 40  ALT 124*  ALKPHOS 104  BILITOT 1.7*   ------------------------------------------------------------------------------------------------------------------  Cardiac Enzymes No results for input(s): TROPONINI in the last 168 hours. ------------------------------------------------------------------------------------------------------------------  RADIOLOGY:  No results found.  EKG:   Orders placed or performed during the hospital encounter of 10/10/15  . EKG 12-Lead  . EKG 12-Lead    IMPRESSION AND PLAN:   Ireoluwa Soisson  is a 82 y.o. male  with a known history of CA, afib s/p pacemaker, h/o GI bleed, CKD stage3, Transitional cell bladder cancer s/p TURBT in oct 2016 and intravesical mitomycin presents from home due to weakness and hematuria.  #1 Hematuria- with underlying bladder cancer history- due to significant drop in hb, - Admit, continuous bladder irrigation, foley - Urology consult  #2 Anemia- acute blood loss from hematuria - 1 unit Tx ordered for active bleeding and symptomatic anemia - Hb check q8h and monitor - avoid aspirin and heparin products -Patient follows up at the cancer center for B12 shots  #3 Afib- on sotalol, rate controlled - not on anticoagulation due to h/o GI bleed and also hematuria -Status post pacemaker  #4 Hypertension-hold enalapril due to active bleeding. Monitor blood pressure.  # 5 acute renal failure on CK D stage III-baseline creatinine around 1.7, now creatinine at almost 2 -Gentle hydration, blood transfusion and monitor. -Avoid nephrotoxins.  #6 GERD-Protonix  #7 DVT prophylaxis- Teds and scds    All the records are reviewed and case discussed with ED provider. Management plans discussed with the patient, family and they are in agreement.  CODE STATUS: Full Code  TOTAL TIME TAKING CARE OF THIS PATIENT: 50  minutes.    Gladstone Lighter M.D on 12/22/2015 at 3:09 PM  Between 7am to 6pm - Pager - [phone]  After 6pm go to [email or web address] - password [email or web address] [facility]  Office  [phone]  CC: Primary care physician; [name], MD

## 2015-12-22 NOTE — ED Notes (Signed)
Sent in by his MD with abnormal labs . Also is having blood in urine  weakness

## 2015-12-22 NOTE — ED Notes (Signed)
Called lab to verify add on labs. States they will run them.

## 2015-12-22 NOTE — Progress Notes (Signed)
Notified MD of bladder spasm, orders placed by MD

## 2015-12-23 ENCOUNTER — Encounter
Admission: EM | Disposition: A | Discharge status: Home / Self Care | Payer: Self-pay | Source: Home / Self Care | Attending: Internal Medicine

## 2015-12-23 ENCOUNTER — Inpatient Hospital Stay: Payer: Medicare Other | Admitting: Anesthesiology

## 2015-12-23 HISTORY — PX: CYSTOSCOPY WITH FULGERATION: SHX6638

## 2015-12-23 LAB — BASIC METABOLIC PANEL
ANION GAP: 6 (ref 5–15)
BUN: 37 mg/dL — ABNORMAL HIGH (ref 6–20)
CHLORIDE: 109 mmol/L (ref 101–111)
CO2: 24 mmol/L (ref 22–32)
Calcium: 8.1 mg/dL — ABNORMAL LOW (ref 8.9–10.3)
Creatinine, Ser: 1.73 mg/dL — ABNORMAL HIGH (ref 0.61–1.24)
GFR calc Af Amer: 39 mL/min — ABNORMAL LOW (ref >60)
GFR, EST NON AFRICAN AMERICAN: 34 mL/min — AB (ref >60)
GLUCOSE: 103 mg/dL — AB (ref 65–99)
POTASSIUM: 4 mmol/L (ref 3.5–5.1)
Sodium: 139 mmol/L (ref 135–145)

## 2015-12-23 LAB — CBC
HEMATOCRIT: 29.1 % — AB (ref 40.0–52.0)
HEMOGLOBIN: 9.6 g/dL — AB (ref 13.0–18.0)
MCH: 31.1 pg (ref 26.0–34.0)
MCHC: 33.1 g/dL (ref 32.0–36.0)
MCV: 93.8 fL (ref 80.0–100.0)
Platelets: 140 10*3/uL — ABNORMAL LOW (ref 150–440)
RBC: 3.1 MIL/uL — ABNORMAL LOW (ref 4.40–5.90)
RDW: 16.2 % — ABNORMAL HIGH (ref 11.5–14.5)
WBC: 5.5 10*3/uL (ref 3.8–10.6)

## 2015-12-23 LAB — TYPE AND SCREEN
ABO/RH(D): AB POS
ANTIBODY SCREEN: NEGATIVE
Unit division: 0

## 2015-12-23 LAB — HEMOGLOBIN
HEMOGLOBIN: 9.6 g/dL — AB (ref 13.0–18.0)
Hemoglobin: 10.3 g/dL — ABNORMAL LOW (ref 13.0–18.0)

## 2015-12-23 SURGERY — CYSTOSCOPY, WITH BLADDER FULGURATION
Anesthesia: General | Wound class: Clean Contaminated

## 2015-12-23 MED ORDER — SODIUM CHLORIDE 0.45 % IV SOLN
INTRAVENOUS | Status: DC
  Administered 2015-12-23 – 2015-12-24 (×4): via INTRAVENOUS

## 2015-12-23 MED ORDER — LACTATED RINGERS IV SOLN
INTRAVENOUS | Status: DC | PRN
  Administered 2015-12-23: 12:00:00 via INTRAVENOUS

## 2015-12-23 MED ORDER — LIDOCAINE HCL (CARDIAC) 20 MG/ML IV SOLN
INTRAVENOUS | Status: DC | PRN
  Administered 2015-12-23: 30 mg via INTRAVENOUS

## 2015-12-23 MED ORDER — SUCCINYLCHOLINE CHLORIDE 20 MG/ML IJ SOLN
INTRAMUSCULAR | Status: DC | PRN
  Administered 2015-12-23: 100 mg via INTRAVENOUS

## 2015-12-23 MED ORDER — ONDANSETRON HCL 4 MG/2ML IJ SOLN
INTRAMUSCULAR | Status: DC | PRN
  Administered 2015-12-23: 4 mg via INTRAVENOUS

## 2015-12-23 MED ORDER — LIDOCAINE HCL 2 % EX GEL
CUTANEOUS | Status: DC | PRN
  Administered 2015-12-23: 1 via URETHRAL

## 2015-12-23 MED ORDER — MIDAZOLAM HCL 2 MG/2ML IJ SOLN
INTRAMUSCULAR | Status: DC | PRN
  Administered 2015-12-23: 2 mg via INTRAVENOUS

## 2015-12-23 MED ORDER — BELLADONNA ALKALOIDS-OPIUM 16.2-60 MG RE SUPP
RECTAL | Status: AC
  Filled 2015-12-23: qty 1

## 2015-12-23 MED ORDER — MORPHINE SULFATE (PF) 2 MG/ML IV SOLN
1.0000 mg | INTRAVENOUS | Status: DC | PRN
  Administered 2015-12-23 – 2015-12-31 (×22): 1 mg via INTRAVENOUS
  Filled 2015-12-23 (×23): qty 1

## 2015-12-23 MED ORDER — PROPOFOL 10 MG/ML IV BOLUS
INTRAVENOUS | Status: DC | PRN
  Administered 2015-12-23: 150 mg via INTRAVENOUS

## 2015-12-23 MED ORDER — PHENYLEPHRINE HCL 10 MG/ML IJ SOLN
INTRAMUSCULAR | Status: DC | PRN
  Administered 2015-12-23 (×10): 200 ug via INTRAVENOUS

## 2015-12-23 MED ORDER — FENTANYL CITRATE (PF) 100 MCG/2ML IJ SOLN
INTRAMUSCULAR | Status: AC
  Administered 2015-12-23: 16:00:00
  Filled 2015-12-23: qty 2

## 2015-12-23 MED ORDER — ONDANSETRON HCL 4 MG/2ML IJ SOLN: 4.0000 mg | Freq: Once | INTRAMUSCULAR | Status: DC | PRN

## 2015-12-23 MED ORDER — FENTANYL CITRATE (PF) 100 MCG/2ML IJ SOLN
25.0000 ug | INTRAMUSCULAR | Status: DC | PRN
  Administered 2015-12-23 (×5): 25 ug via INTRAVENOUS

## 2015-12-23 MED ORDER — LEVOFLOXACIN IN D5W 500 MG/100ML IV SOLN
500.0000 mg | Freq: Once | INTRAVENOUS | Status: AC
  Administered 2015-12-23: 500 mg via INTRAVENOUS
  Filled 2015-12-23: qty 100

## 2015-12-23 MED ORDER — LIDOCAINE HCL 2 % EX GEL
CUTANEOUS | Status: AC
  Filled 2015-12-23: qty 10

## 2015-12-23 MED ORDER — FENTANYL CITRATE (PF) 100 MCG/2ML IJ SOLN
INTRAMUSCULAR | Status: DC | PRN
  Administered 2015-12-23: 100 ug via INTRAVENOUS

## 2015-12-23 MED ORDER — BELLADONNA ALKALOIDS-OPIUM 16.2-60 MG RE SUPP
RECTAL | Status: DC | PRN
  Administered 2015-12-23: 1 via RECTAL

## 2015-12-23 SURGICAL SUPPLY — 25 items
BAG DRAIN CYSTO-URO LG1000N (MISCELLANEOUS) ×4 IMPLANT
BAG URO DRAIN 4000ML (MISCELLANEOUS) ×4 IMPLANT
CATH FOLEY 3WAY 30CC 26FR (CATHETERS) ×4 IMPLANT
ELECT BIVAP BIPO 22/24 DONUT (ELECTROSURGICAL) ×4
ELECT RESECT POWERBALL 24F (MISCELLANEOUS) IMPLANT
ELECTRD BIVAP BIPO 22/24 DONUT (ELECTROSURGICAL) ×2 IMPLANT
GLOVE BIO SURGEON STRL SZ7 (GLOVE) ×8 IMPLANT
GLOVE BIO SURGEON STRL SZ7.5 (GLOVE) ×4 IMPLANT
GOWN STRL REUS W/ TWL LRG LVL3 (GOWN DISPOSABLE) ×2 IMPLANT
GOWN STRL REUS W/ TWL XL LVL3 (GOWN DISPOSABLE) ×2 IMPLANT
GOWN STRL REUS W/TWL LRG LVL3 (GOWN DISPOSABLE) ×2
GOWN STRL REUS W/TWL XL LVL3 (GOWN DISPOSABLE) ×2
IV NS 1000ML (IV SOLUTION) ×2
IV NS 1000ML BAXH (IV SOLUTION) ×2 IMPLANT
KIT RM TURNOVER CYSTO AR (KITS) ×4 IMPLANT
NS IRRIG 1000ML POUR BTL (IV SOLUTION) IMPLANT
PACK CYSTO AR (MISCELLANEOUS) ×4 IMPLANT
PAD GROUND ADULT SPLIT (MISCELLANEOUS) ×4 IMPLANT
PREP PVP WINGED SPONGE (MISCELLANEOUS) ×4 IMPLANT
SET CYSTO W/LG BORE CLAMP LF (SET/KITS/TRAYS/PACK) ×4 IMPLANT
SOL .9 NS 3000ML IRR  AL (IV SOLUTION) ×6
SOL .9 NS 3000ML IRR UROMATIC (IV SOLUTION) ×6 IMPLANT
SURGILUBE 2OZ TUBE FLIPTOP (MISCELLANEOUS) ×4 IMPLANT
WATER STERILE IRR 1000ML POUR (IV SOLUTION) ×8 IMPLANT
WATER STERILE IRR 3000ML UROMA (IV SOLUTION) IMPLANT

## 2015-12-23 NOTE — Progress Notes (Signed)
Dr. Benjie Karvonen notified of severe pain after patient returned to unit from PACU after cystoscopy. 1 mg Morphine Q 4 PRN entered per MD orders.   Almedia Balls, RN

## 2015-12-23 NOTE — Progress Notes (Signed)
PT Cancellation Note  Patient Details Name: Austin Contreras MRN: [id number] DOB: 05-30-1926   Cancelled Treatment:    Reason Eval/Treat Not Completed: Patient at procedure or test/unavailable Pt not in room on PT arrival, will try back later.  Kreg Shropshire 12/23/2015, 5:22 PM

## 2015-12-23 NOTE — Op Note (Addendum)
Preoperative diagnosis: 1. Hemorrhagic radiation cystitis                                            2. Muscle invasive bladder cancer Postoperative diagnosis: 1. Hemorrhagic radiation cystitis                                              2. 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 Cystoscopy with fulguration                      2. Clot evacuation                      3.Catheter placement Surgeon: Otelia Limes. Yves Dill MD, FACS Anesthesia: Gen.  Indications:See the history and physical. After informed consent the above procedure(s) were requested     Technique and findings: After adequate general anesthesia had been obtained the patient was placed into dorsal lithotomy position and the perineum was prepped and draped in the usual fashion. The 24 French resectoscope sheath was then advanced into the bladder with the obturator in place. The sector scope was coupled to the camera and placed into the sheath. The bladder was inspected. Numerous clots were present. The clots were evacuated. The bladder mucosa was fibrotic and had eschar present. There was very little remaining normal mucosa present. No bladder tumors were identified. Ureteral orifices were could not be identified. There were several bleeding sites the bladder mucosa which were cauterized with the button electrode. The resectoscope was then removed and 10 cc of viscous Xylocaine instilled within the urethra and the bladder. A 26 French three-way Foley catheter was placed. Continuous bladder irrigation with saline was initiated. The efflux was clear. A B&O suppository was placed. Procedure was then terminated and the patient was transferred to the recovery room in stable condition.

## 2015-12-23 NOTE — Transfer of Care (Signed)
Immediate Anesthesia Transfer of Care Note  Patient: Austin Contreras  Procedure(s) Performed: Procedure(s): CYSTOSCOPY WITH FULGERATION (Bilateral)  Patient Location: PACU  Anesthesia Type:General  Level of Consciousness: awake, alert  and oriented  Airway & Oxygen Therapy: Patient Spontanous Breathing and Patient connected to face mask oxygen  Post-op Assessment: Report given to RN and Post -op Vital signs reviewed and stable  Post vital signs: Reviewed and stable  Last Vitals:  Filed Vitals:   12/22/15 2141 12/23/15 0455  BP: 147/81 102/62  Pulse: 72 74  Temp: 36.6 C 36.7 C  Resp: 24 20    Complications: No apparent anesthesia complications

## 2015-12-23 NOTE — Progress Notes (Signed)
[facility] [place] NAME: Austin Contreras    MR#:  [id number]  DATE OF BIRTH:  [birth year]  SUBJECTIVE:   Patient complaining of Foley catheter pain  REVIEW OF SYSTEMS:    Review of Systems  Constitutional: Negative for fever, chills and malaise/fatigue.  HENT: Negative for sore throat.   Eyes: Negative for blurred vision.  Respiratory: Negative for cough, hemoptysis, shortness of breath and wheezing.   Cardiovascular: Negative for chest pain, palpitations and leg swelling.  Gastrointestinal: Negative for nausea, vomiting, abdominal pain, diarrhea and blood in stool.  Genitourinary: Positive for hematuria. Negative for dysuria.  Musculoskeletal: Negative for back pain.  Neurological: Negative for dizziness, tremors and headaches.  Endo/Heme/Allergies: Does not bruise/bleed easily.    Tolerating Diet: Yes      DRUG ALLERGIES:   Allergies  Allergen Reactions  . Penicillins Rash and Other (See Comments)    Has patient had a PCN reaction causing immediate rash, facial/tongue/throat swelling, SOB or lightheadedness with hypotension: No Has patient had a PCN reaction causing severe rash involving mucus membranes or skin necrosis: No Has patient had a PCN reaction that required hospitalization No Has patient had a PCN reaction occurring within the last 10 years: No If all of the above answers are "NO", then may proceed with Cephalosporin use.    VITALS:  Blood pressure 102/62, pulse 74, temperature 98.1 F (36.7 C), temperature source Oral, resp. rate 20, height 5\' 10"  (1.778 m), weight 77.565 kg (171 lb), SpO2 91 %.  PHYSICAL EXAMINATION:   Physical Exam  Constitutional: He is oriented to person, place, and time. He appears distressed (from pain from [place]).  HENT:  Head: Normocephalic.  Eyes: No scleral icterus.  Neck: Normal range of motion. Neck supple. No JVD present. No tracheal deviation present.  Cardiovascular:  Normal rate, regular rhythm and normal heart sounds.  Exam reveals no gallop and no friction rub.   No murmur heard. Pulmonary/Chest: Effort normal and breath sounds normal. No respiratory distress. He has no wheezes. He has no rales. He exhibits no tenderness.  Abdominal: Soft. Bowel sounds are normal. He exhibits no distension and no mass. There is no tenderness. There is no rebound and no guarding.  Musculoskeletal: Normal range of motion. He exhibits no edema.  Neurological: He is alert and oriented to person, place, and time.  Skin: Skin is warm. No rash noted. No erythema.  Psychiatric: Affect and judgment normal.      LABORATORY PANEL:   CBC  Recent Labs Lab 12/23/15 0150 12/23/15 0919  WBC 5.5  --   HGB 9.6* 10.3*  HCT 29.1*  --   PLT 140*  --    ------------------------------------------------------------------------------------------------------------------  Chemistries   Recent Labs Lab 12/22/15 1055 12/23/15 0150  NA 138 139  K 4.3 4.0  CL 106 109  CO2 23 24  GLUCOSE 99 103*  BUN 41* 37*  CREATININE 1.99* 1.73*  CALCIUM 8.4* 8.1*  AST 40  --   ALT 124*  --   ALKPHOS 104  --   BILITOT 1.7*  --    ------------------------------------------------------------------------------------------------------------------  Cardiac Enzymes No results for input(s): TROPONINI in the last 168 hours. ------------------------------------------------------------------------------------------------------------------  RADIOLOGY:  No results found.   ASSESSMENT AND PLAN:   82 year old male with a history of radiation proctitis and radiation cystitis with muscle invasive bladder cancer who presents with hematuria.  1. Hematuria: Patient has a large-bore 3-way Foley catheter with continuous bladder irrigation which will  be continued. Urology consultation is pending. Patient may need to undergo cystoscopy. Continue to monitor hemoglobin. Hemoglobin this morning is at  10.3. Continue belladonna and Anusol for radiation proctitis  2. Acute blood loss anemia: This is from hematuria. Patient is status post 1 unit PRBCs. Hemoglobin remained stable. Patient does follow up at the cancer center for B12 injections.  3. Atrial fibrillation: Continue sotalol. Patient is not on anticoagulation due to history of GI bleed.  4. Essential hypertension: Blood pressure is low normal. Continue to hold lisinopril.  5. Acute kidney injury: Patient's creatinine is due to prerenal azotemia. Creatinine is at baseline at 1.7.  Management plans discussed with the patient and he is in agreement.  CODE STATUS: Full.  TOTAL TIME TAKING CARE OF THIS PATIENT: 30 minutes.     POSSIBLE D/C 1-2 days, DEPENDING ON CLINICAL CONDITION.   Haelee Bolen M.D on 12/23/2015 at 1:26 PM  Between 7am to 6pm - Pager - [phone] After 6pm go to [email or web address] - password [email or web address] [facility]  Office  [phone]  CC: Primary care physician; [name], MD  Note: This dictation was prepared with Dragon dictation along with smaller phrase technology. Any transcriptional errors that result from this process are unintentional.

## 2015-12-23 NOTE — Anesthesia Preprocedure Evaluation (Signed)
Anesthesia Evaluation  Patient identified by MRN, date of birth, ID band Patient awake    Reviewed: Allergy & Precautions, NPO status , Patient's Chart, lab work & pertinent test results  History of Anesthesia Complications (+) PONV  Airway Mallampati: II       Dental  (+) Teeth Intact   Pulmonary neg pulmonary ROS, former smoker,           Cardiovascular hypertension, Pt. on medications and Pt. on home beta blockers + CAD  + pacemaker      Neuro/Psych negative neurological ROS  negative psych ROS   GI/Hepatic Neg liver ROS, GERD  Medicated,  Endo/Other  negative endocrine ROS  Renal/GU Renal InsufficiencyRenal disease     Musculoskeletal   Abdominal   Peds  Hematology  (+) anemia ,   Anesthesia Other Findings   Reproductive/Obstetrics                             Anesthesia Physical Anesthesia Plan  ASA: III and emergent  Anesthesia Plan: General   Post-op Pain Management:    Induction: Intravenous and Rapid sequence  Airway Management Planned: Oral ETT  Additional Equipment:   Intra-op Plan:   Post-operative Plan:   Informed Consent: I have reviewed the patients History and Physical, chart, labs and discussed the procedure including the risks, benefits and alternatives for the proposed anesthesia with the patient or authorized representative who has indicated his/her understanding and acceptance.     Plan Discussed with:   Anesthesia Plan Comments:         Anesthesia Quick Evaluation

## 2015-12-23 NOTE — Anesthesia Procedure Notes (Signed)
Procedure Name: Intubation Performed by: Shion Bluestein Pre-anesthesia Checklist: Patient identified, Patient being monitored, Timeout performed, Emergency Drugs available and Suction available Patient Re-evaluated:Patient Re-evaluated prior to inductionOxygen Delivery Method: Circle system utilized Preoxygenation: Pre-oxygenation with 100% oxygen Intubation Type: IV induction Ventilation: Mask ventilation without difficulty Laryngoscope Size: Mac and 3 Grade View: Grade I Tube type: Oral Tube size: 7.0 mm Number of attempts: 1 Airway Equipment and Method: Stylet Placement Confirmation: ETT inserted through vocal cords under direct vision,  positive ETCO2 and breath sounds checked- equal and bilateral Secured at: 21 cm Tube secured with: Tape Dental Injury: Teeth and Oropharynx as per pre-operative assessment        

## 2015-12-23 NOTE — Consult Note (Addendum)
Urology Consult  Referring physician: Gladstone Lighter M.D  Reason for referral: Hematuria  Chief Complaint: Bladder pain and bloody urine with clots  History of Present Illness:  Patient presented to the emergency room with a 1-2 week history of gross hematuria. He had difficulty voiding for approximately 24 hours which prompted the ER visit. Patient has a history of severe radiation cystitis and invasive bladder cancer. He was treated with external beam radiation therapy approximately 2 years ago. He had a recurrent tumor resected in November 2016 and  been set up for hyperbaric oxygen treatments a month or so  ago but could not complete the first treatment due to diarrhea. Patient has radiation proctitis but his diarrhea has since improved.   Past Surgical History  Procedure Laterality Date  . Appendectomy    . Coronary artery bypass graft    . Cataracts    . Cataract extraction, bilateral    . Transurethral resection of bladder tumor N/A 10/17/2015    Procedure: TRANSURETHRAL RESECTION OF BLADDER TUMOR (TURBT);  Surgeon: Royston Cowper, MD;  Location: [facility] ORS;  Service: Urology;  Laterality: N/A;  . Aortic valve replacement    . Pacemaker insertion    . Hand surgery      after trauma  . Rotator cuff repair      right shoulder  . Prepatellar bursa excision      TURBT 2014, 2016  PAST MEDICAL HISTORY: Past Medical History  Diagnosis Date  . B12 deficiency anemia 08/02/2015  . Bladder cancer ([place])     Muscle invasive s/p EBRT 2014, TURBT 2014, 2016  . GERD (gastroesophageal reflux disease)   . Hypertension   . PONV (postoperative nausea and vomiting)   . Hyperlipidemia   . Coronary artery disease   . H/O aortic valve replacement with porcine valve   . BPH (benign prostatic hyperplasia)   . A-fib ([place])   . SVT (supraventricular tachycardia) ([place])   . CKD (chronic kidney disease) stage 3, GFR 30-59 ml/min Radiation cystitis Radiation proctitis Small capacity/ contracted  bladder due to radiation exposure     Medications: see H & P  Allergies:  Allergies  Allergen Reactions  . Penicillins Rash and Other (See Comments)    Has patient had a PCN reaction causing immediate rash, facial/tongue/throat swelling, SOB or lightheadedness with hypotension: No Has patient had a PCN reaction causing severe rash involving mucus membranes or skin necrosis: No Has patient had a PCN reaction that required hospitalization No Has patient had a PCN reaction occurring within the last 10 years: No If all of the above answers are "NO", then may proceed with Cephalosporin use.    Family History  Problem Relation Age of Onset  . Lymphoma Sister   . Bone cancer Mother   . CAD Father   . CVA Father    Social History:  reports that he has quit smoking. His smoking use included Cigarettes. He has never used smokeless tobacco. He reports that he does not drink alcohol or use illicit drugs.  Review of Systems  Genitourinary: Positive for dysuria, urgency, frequency and hematuria.  : Muscle invasive bladder cancer treated with radiation therapy 2 years ago. The patient did not wish to proceed with chemotherapy at that time. Patient also has severe radiation cystitis with hematuria and radiation proctitis with diarrhea.  Physical Exam:  Vital signs in last 24 hours: Temp:  [97.8 F (36.6 C)-98.6 F (37 C)] 98.1 F (36.7 C) (01/07 0455) Pulse Rate:  [66-78] 74 (  01/07 0455) Resp:  [15-24] 20 (01/07 0455) BP: (102-147)/(62-81) 102/62 mmHg (01/07 0455) SpO2:  [91 %-100 %] 91 % (01/07 0455) Physical Exam  abdomen was soft.. No flank tenderness.  Laboratory Data:  Results for orders placed or performed during the hospital encounter of 12/22/15 (from the past 72 hour(s))  CBC     Status: Abnormal   Collection Time: 12/22/15 10:55 AM  Result Value Ref Range   WBC 5.9 3.8 - 10.6 K/uL   RBC 3.11 (L) 4.40 - 5.90 MIL/uL   Hemoglobin 9.5 (L) 13.0 - 18.0 g/dL   HCT 28.6 (L) 40.0 -  52.0 %   MCV 91.9 80.0 - 100.0 fL   MCH 30.5 26.0 - 34.0 pg   MCHC 33.2 32.0 - 36.0 g/dL   RDW 16.2 (H) 11.5 - 14.5 %   Platelets 155 150 - 440 K/uL  Lipase, blood     Status: Abnormal   Collection Time: 12/22/15 10:55 AM  Result Value Ref Range   Lipase 65 (H) 11 - 51 U/L  Comprehensive metabolic panel     Status: Abnormal   Collection Time: 12/22/15 10:55 AM  Result Value Ref Range   Sodium 138 135 - 145 mmol/L   Potassium 4.3 3.5 - 5.1 mmol/L   Chloride 106 101 - 111 mmol/L   CO2 23 22 - 32 mmol/L   Glucose, Bld 99 65 - 99 mg/dL   BUN 41 (H) 6 - 20 mg/dL   Creatinine, Ser 1.99 (H) 0.61 - 1.24 mg/dL   Calcium 8.4 (L) 8.9 - 10.3 mg/dL   Total Protein 6.9 6.5 - 8.1 g/dL   Albumin 3.7 3.5 - 5.0 g/dL   AST 40 15 - 41 U/L   ALT 124 (H) 17 - 63 U/L   Alkaline Phosphatase 104 38 - 126 U/L   Total Bilirubin 1.7 (H) 0.3 - 1.2 mg/dL   GFR calc non Af Amer 28 (L) >60 mL/min   GFR calc Af Amer 33 (L) >60 mL/min    Comment: (NOTE) The eGFR has been calculated using the CKD EPI equation. This calculation has not been validated in all clinical situations. eGFR's persistently <60 mL/min signify possible Chronic Kidney Disease.    Anion gap 9 5 - 15  Protime-INR     Status: None   Collection Time: 12/22/15 10:55 AM  Result Value Ref Range   Prothrombin Time 14.6 11.4 - 15.0 seconds   INR 1.12   APTT     Status: None   Collection Time: 12/22/15 10:55 AM  Result Value Ref Range   aPTT 36 24 - 36 seconds  Urinalysis complete, with microscopic ([facility] only)     Status: Abnormal   Collection Time: 12/22/15  1:31 PM  Result Value Ref Range   Color, Urine RED (A) YELLOW   APPearance CLOUDY (A) CLEAR   Glucose, UA (A) NEGATIVE mg/dL    TEST NOT REPORTED DUE TO COLOR INTERFERENCE OF URINE PIGMENT   Bilirubin Urine (A) NEGATIVE    TEST NOT REPORTED DUE TO COLOR INTERFERENCE OF URINE PIGMENT   Ketones, ur (A) NEGATIVE mg/dL    TEST NOT REPORTED DUE TO COLOR INTERFERENCE OF URINE PIGMENT    Specific Gravity, Urine 1.018 1.005 - 1.030   Hgb urine dipstick (A) NEGATIVE    TEST NOT REPORTED DUE TO COLOR INTERFERENCE OF URINE PIGMENT   pH NRCI 5.0 - 8.0   Protein, ur (A) NEGATIVE mg/dL    TEST NOT REPORTED DUE TO  COLOR INTERFERENCE OF URINE PIGMENT   Nitrite (A) NEGATIVE    TEST NOT REPORTED DUE TO COLOR INTERFERENCE OF URINE PIGMENT   Leukocytes, UA (A) NEGATIVE    TEST NOT REPORTED DUE TO COLOR INTERFERENCE OF URINE PIGMENT   RBC / HPF TOO NUMEROUS TO COUNT 0 - 5 RBC/hpf   WBC, UA 0-5 0 - 5 WBC/hpf   Bacteria, UA NONE SEEN NONE SEEN   Squamous Epithelial / LPF NONE SEEN NONE SEEN  ABO/Rh     Status: None   Collection Time: 12/22/15  1:32 PM  Result Value Ref Range   ABO/RH(D) AB POS   Type and screen [facility]     Status: None (Preliminary result)   Collection Time: 12/22/15  1:35 PM  Result Value Ref Range   ABO/RH(D) AB POS    Antibody Screen NEG    Sample Expiration 12/25/2015    Unit Number [id number]    Blood Component Type RBC, LR IRR    Unit division 00    Status of Unit ISSUED    Transfusion Status OK TO TRANSFUSE    Crossmatch Result Compatible   Prepare RBC     Status: None   Collection Time: 12/22/15  1:35 PM  Result Value Ref Range   Order Confirmation ORDER PROCESSED BY BLOOD BANK   Hemoglobin     Status: Abnormal   Collection Time: 12/22/15  7:12 PM  Result Value Ref Range   Hemoglobin 10.5 (L) 13.0 - 18.0 g/dL  Basic metabolic panel     Status: Abnormal   Collection Time: 12/23/15  1:50 AM  Result Value Ref Range   Sodium 139 135 - 145 mmol/L   Potassium 4.0 3.5 - 5.1 mmol/L   Chloride 109 101 - 111 mmol/L   CO2 24 22 - 32 mmol/L   Glucose, Bld 103 (H) 65 - 99 mg/dL   BUN 37 (H) 6 - 20 mg/dL   Creatinine, Ser 1.73 (H) 0.61 - 1.24 mg/dL   Calcium 8.1 (L) 8.9 - 10.3 mg/dL   GFR calc non Af Amer 34 (L) >60 mL/min   GFR calc Af Amer 39 (L) >60 mL/min    Comment: (NOTE) The eGFR has been calculated using the CKD EPI  equation. This calculation has not been validated in all clinical situations. eGFR's persistently <60 mL/min signify possible Chronic Kidney Disease.    Anion gap 6 5 - 15  CBC     Status: Abnormal   Collection Time: 12/23/15  1:50 AM  Result Value Ref Range   WBC 5.5 3.8 - 10.6 K/uL   RBC 3.10 (L) 4.40 - 5.90 MIL/uL   Hemoglobin 9.6 (L) 13.0 - 18.0 g/dL   HCT 29.1 (L) 40.0 - 52.0 %   MCV 93.8 80.0 - 100.0 fL   MCH 31.1 26.0 - 34.0 pg   MCHC 33.1 32.0 - 36.0 g/dL   RDW 16.2 (H) 11.5 - 14.5 %   Platelets 140 (L) 150 - 440 K/uL  Hemoglobin     Status: Abnormal   Collection Time: 12/23/15  9:19 AM  Result Value Ref Range   Hemoglobin 10.3 (L) 13.0 - 18.0 g/dL   No results found for this or any previous visit (from the past 240 hour(s)). Creatinine:  Recent Labs  12/22/15 1055 12/23/15 0150  CREATININE 1.99* 1.73*     Impression/Assessment:  1. Radiation cystitis with severe hematuria and contracted bladder 2. Radiation proctitis with diarrhea 3. Muscle invasive bladder cancer  status post external beam radiation therapy 2 years ago.  Plan:  1. Large bore 3-way Foley catheter with continuous bladder irrigation 2. Received with cystoscopy with fulguration and evacuation 3  After discharge please refer back to hyperbaric oxygen treatment center  WOLFF,MICHAEL R 12/23/2015, 12:12 PM

## 2015-12-23 NOTE — Anesthesia Postprocedure Evaluation (Signed)
Anesthesia Post Note  Patient: Austin Contreras  Procedure(s) Performed: Procedure(s) (LRB): CYSTOSCOPY WITH FULGERATION (Bilateral)  Patient location during evaluation: PACU Anesthesia Type: General Level of consciousness: awake and alert Pain management: pain level controlled Vital Signs Assessment: post-procedure vital signs reviewed and stable Respiratory status: spontaneous breathing and respiratory function stable Cardiovascular status: stable Anesthetic complications: no    Last Vitals:  Filed Vitals:   12/23/15 1643 12/23/15 2047  BP: 113/62 112/56  Pulse: 70 69  Temp: 36.7 C 36.6 C  Resp: 20 20    Last Pain:  Filed Vitals:   12/23/15 2047  PainSc: Asleep                 Tessia Kassin K

## 2015-12-24 ENCOUNTER — Inpatient Hospital Stay: Payer: Medicare Other

## 2015-12-24 LAB — BASIC METABOLIC PANEL
Anion gap: 8 (ref 5–15)
BUN: 36 mg/dL — AB (ref 6–20)
CALCIUM: 8.1 mg/dL — AB (ref 8.9–10.3)
CO2: 21 mmol/L — AB (ref 22–32)
CREATININE: 2.3 mg/dL — AB (ref 0.61–1.24)
Chloride: 104 mmol/L (ref 101–111)
GFR calc non Af Amer: 24 mL/min — ABNORMAL LOW (ref >60)
GFR, EST AFRICAN AMERICAN: 28 mL/min — AB (ref >60)
Glucose, Bld: 96 mg/dL (ref 65–99)
Potassium: 4.8 mmol/L (ref 3.5–5.1)
SODIUM: 133 mmol/L — AB (ref 135–145)

## 2015-12-24 LAB — CBC
HEMATOCRIT: 28.4 % — AB (ref 40.0–52.0)
Hemoglobin: 9.5 g/dL — ABNORMAL LOW (ref 13.0–18.0)
MCH: 31.1 pg (ref 26.0–34.0)
MCHC: 33.5 g/dL (ref 32.0–36.0)
MCV: 92.9 fL (ref 80.0–100.0)
Platelets: 147 10*3/uL — ABNORMAL LOW (ref 150–440)
RBC: 3.05 MIL/uL — ABNORMAL LOW (ref 4.40–5.90)
RDW: 15.8 % — AB (ref 11.5–14.5)
WBC: 8.4 10*3/uL (ref 3.8–10.6)

## 2015-12-24 MED ORDER — ENALAPRIL MALEATE 5 MG PO TABS
5.0000 mg | ORAL_TABLET | Freq: Every day | ORAL | Status: DC
  Administered 2015-12-24: 5 mg via ORAL
  Filled 2015-12-24: qty 1

## 2015-12-24 MED ORDER — SIMETHICONE 80 MG PO CHEW
80.0000 mg | CHEWABLE_TABLET | Freq: Two times a day (BID) | ORAL | Status: DC | PRN
  Administered 2015-12-25: 80 mg via ORAL
  Filled 2015-12-24 (×2): qty 1

## 2015-12-24 NOTE — Progress Notes (Signed)
[place] [facility] [place] NAME: Austin Contreras    MR#:  [id number]  DATE OF BIRTH:  08/15/1926  SUBJECTIVE:   Patient is s/p cystoscopy with clot evacuation. Dr Eliberto Ivory gave family three options to decide on. Patient with minimal blood in CBI this am   REVIEW OF SYSTEMS:    Review of Systems  Constitutional: Negative for fever, chills and malaise/fatigue.  HENT: Negative for sore throat.   Eyes: Negative for blurred vision.  Respiratory: Negative for cough, hemoptysis, shortness of breath and wheezing.   Cardiovascular: Negative for chest pain, palpitations and leg swelling.  Gastrointestinal: Negative for nausea, vomiting, abdominal pain, diarrhea and blood in stool.  Genitourinary: Positive for hematuria (resolving). Negative for dysuria.  Musculoskeletal: Negative for back pain.  Neurological: Negative for dizziness, tremors and headaches.  Endo/Heme/Allergies: Does not bruise/bleed easily.    Tolerating Diet: Yes      DRUG ALLERGIES:   Allergies  Allergen Reactions  . Penicillins Rash and Other (See Comments)    Has patient had a PCN reaction causing immediate rash, facial/tongue/throat swelling, SOB or lightheadedness with hypotension: No Has patient had a PCN reaction causing severe rash involving mucus membranes or skin necrosis: No Has patient had a PCN reaction that required hospitalization No Has patient had a PCN reaction occurring within the last 10 years: No If all of the above answers are "NO", then may proceed with Cephalosporin use.    VITALS:  Blood pressure 116/64, pulse 71, temperature 98.3 F (36.8 C), temperature source Oral, resp. rate 22, height 5\' 10"  (1.778 m), weight 77.565 kg (171 lb), SpO2 97 %.  PHYSICAL EXAMINATION:   Physical Exam  Constitutional: He is oriented to person, place, and time. No distress (from pain from cathetar).  HENT:  Head: Normocephalic.  Eyes: No scleral icterus.  Neck:  Normal range of motion. Neck supple. No JVD present. No tracheal deviation present.  Cardiovascular: Normal rate, regular rhythm and normal heart sounds.  Exam reveals no gallop and no friction rub.   No murmur heard. Pulmonary/Chest: Effort normal and breath sounds normal. No respiratory distress. He has no wheezes. He has no rales. He exhibits no tenderness.  Abdominal: Soft. Bowel sounds are normal. He exhibits no distension and no mass. There is no tenderness. There is no rebound and no guarding.  Musculoskeletal: Normal range of motion. He exhibits no edema.  Neurological: He is alert and oriented to person, place, and time.  Skin: Skin is warm. No rash noted. No erythema.  Psychiatric: Affect and judgment normal.      LABORATORY PANEL:   CBC  Recent Labs Lab 12/24/15 0508  WBC 8.4  HGB 9.5*  HCT 28.4*  PLT 147*   ------------------------------------------------------------------------------------------------------------------  Chemistries   Recent Labs Lab 12/22/15 1055 12/23/15 0150  NA 138 139  K 4.3 4.0  CL 106 109  CO2 23 24  GLUCOSE 99 103*  BUN 41* 37*  CREATININE 1.99* 1.73*  CALCIUM 8.4* 8.1*  AST 40  --   ALT 124*  --   ALKPHOS 104  --   BILITOT 1.7*  --    ------------------------------------------------------------------------------------------------------------------  Cardiac Enzymes No results for input(s): TROPONINI in the last 168 hours. ------------------------------------------------------------------------------------------------------------------  RADIOLOGY:  No results found.   ASSESSMENT AND PLAN:   82 year old male with a history of radiation proctitis and radiation cystitis with muscle invasive bladder cancer who presents with hematuria.  1. Hematuria: Patient has a large-bore 3-way Foley  catheter with continuous bladder irrigation which will be continued for now as he has a little bit of blood in urine. I have spoken with Dr  Rogers Blocker about patient. Patient has three options 1) continue with FOLEY 2) bilateral nephrostomy tubes or Palliatve care. He essentially has end stage radiation cystitis according to Dr Rogers Blocker.  Family and patient to decide on this. I will consult Palliative care for tomorrow. Hgb stable today.  2. Acute blood loss anemia: This is from hematuria. Patient is status post 1 unit PRBCs. Hemoglobin remained stable. Patient does follow up at the cancer center for B12 injections.  3. Atrial fibrillation: Continue sotalol. Patient is not on anticoagulation due to history of GI bleed.  4. Essential hypertension: Blood pressure improved. Restart ACEI.  5. Acute kidney injury: Patient's creatinine is due to prerenal azotemia. Creatinine is at baseline at 1.7.   6. Hypoxia: check O2 on RA and CXR   Management plans discussed with the patient and he is in agreement.  CODE STATUS: Full.  TOTAL TIME TAKING CARE OF THIS PATIENT: 29 minutes.  D/w Dr Rogers Blocker   PT consult   POSSIBLE D/C 1-2 days, DEPENDING ON CLINICAL CONDITION.   Angla Delahunt M.D on 12/24/2015 at 8:21 AM  Between 7am to 6pm - Pager - [phone] After 6pm go to [email or web address] - password [email or web address] [facility]  Office  [phone]  CC: Primary care physician; [name], MD  Note: This dictation was prepared with Dragon dictation along with smaller phrase technology. Any transcriptional errors that result from this process are unintentional.

## 2015-12-24 NOTE — Evaluation (Signed)
Physical Therapy Evaluation Patient Details Name: Austin Contreras MRN: [id number] DOB: 18-Oct-1926 Today's Date: 12/24/2015   History of Present Illness  Austin Contreras is a 82 y.o. 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 Patient is s/p cystoscopy with fulguration, clot evacuation and catheter placement;   Clinical Impression  82 yo Male s/p cystoscopy with catheter placement. Patient was independent prior to admittance without AD. He currently lives at home with stairs to enter. He currently is min A for bed mobility, min A for sit<>stand transfers with RW and requires min A for gait tasks with RW. He is unable to walk without AD at this time due to impaired balance and weakness. Patient does demonstrate increased weakness in BLE hips. He also demonstrates fair standing balance. Patient ambulates at significantly slower gait speed with 3 point gait pattern. He would benefit from additional skilled PT intervention to improve strength, balance and gait safety. Patient's personal factors affecting rehab: stairs to enter house, decreased hemaglobin due to hematuria, and increased risk for falls. PT will address LE weakness, impaired balance, decreased gait safety, decreased sensation on plantar surface of feet and decreased mobility. His clinical presentation is evolving.     Follow Up Recommendations Home health PT    Equipment Recommendations  None recommended by PT (has functioning RW)    Recommendations for Other Services       Precautions / Restrictions Precautions Precautions: Fall Restrictions Weight Bearing Restrictions: No      Mobility  Bed Mobility Overal bed mobility: Needs Assistance Bed Mobility: Supine to Sit     Supine to sit: Min assist     General bed mobility comments: requires 1 HHA to pull self into  sitting;   Transfers Overall transfer level: Needs assistance Equipment used: Rolling walker (2 wheeled) Transfers: Sit to/from Stand Sit to Stand: Min assist         General transfer comment: Patient transferred sit<>Stand with RW, CGA; without AD, min A x1 rep each with min Vcs for hand placement and to increase scoot forward prior to standing for better transfer ability;   Ambulation/Gait Ambulation/Gait assistance: Min assist Ambulation Distance (Feet): 40 Feet Assistive device: Rolling walker (2 wheeled) Gait Pattern/deviations: Step-through pattern;Decreased step length - left;Decreased step length - right;Shuffle;Decreased dorsiflexion - left;Decreased dorsiflexion - right Gait velocity: significantly decreased   General Gait Details: Patient ambulates with RW, min A for balance with slower gait speed, 3 point gait pattern with RW; Patient grimaces and moans due to penis pain in standing. Patient required min Vcs to keep walker close to self especially during turns for increased gait safety;   Stairs            Wheelchair Mobility    Modified Rankin (Stroke Patients Only)       Balance Overall balance assessment: Needs assistance Sitting-balance support: Single extremity supported Sitting balance-Leahy Scale: Good Sitting balance - Comments: patient tends to lean to left due to increased pain on bottom when sitting erect; He is able to sit without HHA with good balance but prefers to lean to side due to discomfort;  Postural control: Left lateral lean Standing balance support: Single extremity supported Standing balance-Leahy Scale: Fair Standing balance comment: Patient requires at least 1 HHA in standing for balance control; He demonstrates fair static and dynamic standing balance;  Pertinent Vitals/Pain Pain Assessment: 0-10 Pain Score: 5  Pain Location: bottom and penis Pain Intervention(s): Limited activity within  patient's tolerance;Monitored during session;Repositioned (RN informed of pain)    Home Living Family/patient expects to be discharged to:: Private residence Living Arrangements: Spouse/significant other Available Help at Discharge: Family (wife there 24/7) Type of Home: House Home Access: Stairs to enter   [employer] of Steps: 1-2 steps (has 1 rail with 2 steps and then uses doorway for 1 step at backdoor) Home Layout: One level (however has 1 step to go to [employer] (doorway to hold onto)) Home Equipment: Environmental consultant - 2 wheels      Prior Function Level of Independence: Independent         Comments: reports being very active prior to admittance; did not use any AD; was driving; doing yardwork;      Hand Dominance   Dominant Hand: Left    Extremity/Trunk Assessment   Upper Extremity Assessment: Overall WFL for tasks assessed           Lower Extremity Assessment: RLE deficits/detail;LLE deficits/detail RLE Deficits / Details: hip grossly 4+/5, knee 5/5, ankle 5/5; intact light touch/deep pressure throughout LE except decreased light touch sensation along plantar surface of foot;  LLE Deficits / Details: hip grossly 4+/5, knee 5/5, ankle 5/5; intact light touch/deep pressure throughout LE except decreased light touch sensation along plantar surface of foot;      Communication   Communication: No difficulties  Cognition Arousal/Alertness: Awake/alert Behavior During Therapy: WFL for tasks assessed/performed Overall Cognitive Status: Within Functional Limits for tasks assessed                      General Comments General comments (skin integrity, edema, etc.): intact by gross assessment; does have bloody urine in catheter    Exercises        Assessment/Plan    PT Assessment Patient needs continued PT services  PT Diagnosis Difficulty walking;Generalized weakness   PT Problem List Decreased strength;Decreased activity tolerance;Decreased safety  awareness;Decreased balance;Decreased mobility;Pain  PT Treatment Interventions DME instruction;Balance training;Gait training;Neuromuscular re-education;Stair training;Functional mobility training;Therapeutic activities;Therapeutic exercise;Patient/family education   PT Goals (Current goals can be found in the Care Plan section) Acute Rehab PT Goals Patient Stated Goal: "to be able to go home and get back to PLOF" PT Goal Formulation: With patient Time For Goal Achievement: 01/07/16 Potential to Achieve Goals: Good    Frequency Min 2X/week   Barriers to discharge Inaccessible home environment has stairs to enter, but will have caregiver assistance when he goes home 24/7    Co-evaluation               End of Session Equipment Utilized During Treatment: Gait belt Activity Tolerance: Patient limited by pain Patient left: in chair;with call bell/phone within reach;with chair alarm set Nurse Communication: Mobility status         Time: [id number] PT Time Calculation (min) (ACUTE ONLY): 34 min   Charges:   PT Evaluation $PT Eval Moderate Complexity: 1 Procedure     PT G Codes:        Lexton Hidalgo PT, DPT 12/24/2015, 1:14 PM

## 2015-12-24 NOTE — Progress Notes (Signed)
Austin Contreras is a 82 y.o. male patient. 1. Hematuria   2. Acute blood loss anemia   3. Hypoxia   4.      End-stage radiation cystitis Past Medical History  Diagnosis Date  . B12 deficiency anemia 08/02/2015  . Bladder cancer ([place])     stage 2 s/p TUBTR  . GERD (gastroesophageal reflux disease)   . Hypertension   . PONV (postoperative nausea and vomiting)   . Hyperlipidemia   . Coronary artery disease   . H/O aortic valve replacement with porcine valve   . BPH (benign prostatic hyperplasia)   . A-fib ([place])   . SVT (supraventricular tachycardia) ([place])   . CKD (chronic kidney disease) stage 3, GFR 30-59 ml/min    Current Facility-Administered Medications  Medication Dose Route Frequency Provider Last Rate Last Dose  . acetaminophen (TYLENOL) tablet 650 mg  650 mg Oral Q6H PRN Gladstone Lighter, MD       Or  . acetaminophen (TYLENOL) suppository 650 mg  650 mg Rectal Q6H PRN Gladstone Lighter, MD      . docusate sodium (COLACE) capsule 200 mg  200 mg Oral BID Gladstone Lighter, MD   200 mg at 12/23/15 2114  . enalapril (VASOTEC) tablet 5 mg  5 mg Oral Daily Sital Mody, MD      . hydrocortisone (ANUSOL-HC) suppository 25 mg  25 mg Rectal BID PRN Gladstone Lighter, MD      . morphine 2 MG/ML injection 1 mg  1 mg Intravenous Q4H PRN Bettey Costa, MD   1 mg at 12/24/15 0838  . ondansetron (ZOFRAN) tablet 4 mg  4 mg Oral Q6H PRN Gladstone Lighter, MD       Or  . ondansetron (ZOFRAN) injection 4 mg  4 mg Intravenous Q6H PRN Gladstone Lighter, MD      . opium-belladonna (B&O SUPPRETTES) 16.2-60 MG suppository 1 suppository  1 suppository Rectal Q6H PRN Gladstone Lighter, MD   1 suppository at 12/24/15 0859  . oxyCODONE (Oxy IR/ROXICODONE) immediate release tablet 5 mg  5 mg Oral Q4H PRN Gladstone Lighter, MD   5 mg at 12/24/15 0217  . pantoprazole (PROTONIX) EC tablet 40 mg  40 mg Oral QAC breakfast Gladstone Lighter, MD   40 mg at 12/23/15 0908  . polyethylene glycol (MIRALAX / GLYCOLAX)  packet 17 g  17 g Oral Daily PRN Gladstone Lighter, MD      . simvastatin (ZOCOR) tablet 10 mg  10 mg Oral QHS Gladstone Lighter, MD   10 mg at 12/23/15 2114  . sodium chloride 0.9 % injection 3 mL  3 mL Intravenous Q12H Gladstone Lighter, MD   3 mL at 12/22/15 2333  . sotalol (BETAPACE) tablet 80 mg  80 mg Oral BID Gladstone Lighter, MD   80 mg at 12/23/15 2114   Allergies  Allergen Reactions  . Penicillins Rash and Other (See Comments)    Has patient had a PCN reaction causing immediate rash, facial/tongue/throat swelling, SOB or lightheadedness with hypotension: No Has patient had a PCN reaction causing severe rash involving mucus membranes or skin necrosis: No Has patient had a PCN reaction that required hospitalization No Has patient had a PCN reaction occurring within the last 10 years: No If all of the above answers are "NO", then may proceed with Cephalosporin use.   Active Problems:   Hematuria   End-stage radiation cystitis   Blood pressure 116/64, pulse 71, temperature 98.3 F (36.8 C), temperature source Oral, resp. rate 22,  height 5\' 10"  (1.778 m), weight 77.565 kg (171 lb), SpO2 90 %.  Subjective:  Pelvic pain and bladder spasms  Objective:  Urine lightly blood-tinged off CBI Assessment & Plan 1. End-stage radiation cystitis     Treatment options discussed with patient and family included chronic indwelling Foley with monthly exchanges, Foley catheter removal, and bilateral nephrostomy tube placement with monthly exchanges. Advantages and disadvantages of each option were thoroughly reviewed with them. Hyperbaric oxygen treatments may also be considered but would be palliative and not immediately effective. I also mentioned the possibility of transfer to a tertiary care referral Center to determine if he was a candidate for any experimental options.  2. Pelvic pain     Patient may require chronic pain management 3. Bladder spasms     B and O suppositories or  anticholinergic medications   WOLFF,MICHAEL R 12/24/2015

## 2015-12-25 ENCOUNTER — Encounter: Payer: Self-pay | Admitting: Urology

## 2015-12-25 ENCOUNTER — Inpatient Hospital Stay: Payer: Medicare Other

## 2015-12-25 LAB — BASIC METABOLIC PANEL
Anion gap: 8 (ref 5–15)
BUN: 47 mg/dL — ABNORMAL HIGH (ref 6–20)
CHLORIDE: 104 mmol/L (ref 101–111)
CO2: 19 mmol/L — ABNORMAL LOW (ref 22–32)
CREATININE: 3.24 mg/dL — AB (ref 0.61–1.24)
Calcium: 8.1 mg/dL — ABNORMAL LOW (ref 8.9–10.3)
GFR calc non Af Amer: 16 mL/min — ABNORMAL LOW (ref >60)
GFR, EST AFRICAN AMERICAN: 18 mL/min — AB (ref >60)
Glucose, Bld: 104 mg/dL — ABNORMAL HIGH (ref 65–99)
POTASSIUM: 4.3 mmol/L (ref 3.5–5.1)
SODIUM: 131 mmol/L — AB (ref 135–145)

## 2015-12-25 LAB — HEMOGLOBIN: Hemoglobin: 9.1 g/dL — ABNORMAL LOW (ref 13.0–18.0)

## 2015-12-25 MED ORDER — DOCUSATE SODIUM 100 MG PO CAPS
100.0000 mg | ORAL_CAPSULE | Freq: Two times a day (BID) | ORAL | Status: DC
  Administered 2015-12-25 – 2016-01-01 (×14): 100 mg via ORAL
  Filled 2015-12-25 (×15): qty 1

## 2015-12-25 MED ORDER — NEPRO/CARBSTEADY PO LIQD
237.0000 mL | Freq: Two times a day (BID) | ORAL | Status: DC
  Administered 2015-12-26 (×2): 237 mL via ORAL

## 2015-12-25 MED ORDER — POLYETHYLENE GLYCOL 3350 17 G PO PACK
17.0000 g | PACK | Freq: Every day | ORAL | Status: DC
  Administered 2015-12-25 – 2016-01-01 (×6): 17 g via ORAL
  Filled 2015-12-25 (×5): qty 1

## 2015-12-25 NOTE — Progress Notes (Addendum)
Physical Therapy Treatment Patient Details Name: Austin Contreras MRN: [id number] DOB: 1926-03-27 Today's Date: 12/25/2015    History of Present Illness Blain Guetter is a 82 y.o. 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 Patient is s/p cystoscopy with fulguration, clot evacuation and catheter placement;     PT Comments    Pt is making slow progress towards goals with limited ambulation distance this session secondary to pain. Pt out of room for testing and got lunch tray delivered late. Pt very anxious to eat and agreeable to sit in recliner for lunch. Refused further mobility at this time, room air sats at 91% with mobility. Pt slightly impulsive and will need increased assistance at home for mobility/safety.  Follow Up Recommendations  Home health PT     Equipment Recommendations  None recommended by PT    Recommendations for Other Services       Precautions / Restrictions Precautions Precautions: Fall Restrictions Weight Bearing Restrictions: No    Mobility  Bed Mobility Overal bed mobility: Needs Assistance Bed Mobility: Supine to Sit     Supine to sit: Min assist     General bed mobility comments: assist for trunk mobility and sitting at EOB. Pt able to swing B LEs off with independence. Assist required for line management  Transfers Overall transfer level: Needs assistance Equipment used: Rolling walker (2 wheeled) Transfers: Sit to/from Stand Sit to Stand: Min guard         General transfer comment: safe technique with rw and cga. Pt slightly impulsive and tries to get up prior to therapist instructions. Pt moans while in upright position  Ambulation/Gait Ambulation/Gait assistance: Min assist Ambulation Distance (Feet): 3 Feet Assistive device: Rolling walker (2 wheeled) Gait  Pattern/deviations: Step-to pattern     General Gait Details: Pt ambulates with rw and hesitant step to gait pattern. Pt complains of increased pain during upright position. Pt refuses to ambulate further distance at this time secondary to pain.   Stairs            Wheelchair Mobility    Modified Rankin (Stroke Patients Only)       Balance                                    Cognition Arousal/Alertness: Awake/alert Behavior During Therapy: WFL for tasks assessed/performed Overall Cognitive Status: Within Functional Limits for tasks assessed                      Exercises      General Comments        Pertinent Vitals/Pain Pain Assessment: Faces Faces Pain Scale: Hurts whole lot Pain Location: groin Pain Descriptors / Indicators: Constant Pain Intervention(s): Limited activity within patient's tolerance    Home Living                      Prior Function            PT Goals (current goals can now be found in the care plan section) Acute Rehab PT Goals Patient Stated Goal: "to be able to go home and get back to PLOF" PT Goal Formulation: With patient Time For Goal Achievement: 01/07/16 Potential to Achieve Goals: Good Progress towards PT  goals: Progressing toward goals    Frequency  Min 2X/week    PT Plan Current plan remains appropriate    Co-evaluation             End of Session Equipment Utilized During Treatment: Gait belt Activity Tolerance: Patient limited by pain Patient left: in chair;with call bell/phone within reach;with chair alarm set     Time: 1343-1400 PT Time Calculation (min) (ACUTE ONLY): 17 min  Charges:  $Gait Training: 8-22 mins                    G Codes:      Kareemah Grounds December 27, 2015, 3:58 PM  Greggory Stallion, PT, DPT [phone]

## 2015-12-25 NOTE — Progress Notes (Signed)
Subjective:  Patient is known to our practice from outpatient. He is usually followed by Dr. Holley Raring. He was last seen in October 2016  He was admitted on January 6 for hematuria He was seen by urologist and placed on continuous bladder irrigation Hospital course is also complicated by acute renal failure with creatinine going up to 3.24 today Admission creatinine was 1.73/GFR of 34  Patient reports abdominal/bladder spasms His by mouth intake is poor    Objective:  Vital signs in last 24 hours:  Temp:  [97.9 F (36.6 C)-98.2 F (36.8 C)] 97.9 F (36.6 C) (01/09 1203) Pulse Rate:  [69-73] 69 (01/09 1203) Resp:  [17-22] 17 (01/09 1203) BP: (95-118)/(52-66) 118/52 mmHg (01/09 1203) SpO2:  [93 %-98 %] 94 % (01/09 1203)  Weight change:  Filed Weights   12/22/15 1048  Weight: 77.565 kg (171 lb)    Intake/Output:    Intake/Output Summary (Last 24 hours) at 12/25/15 1747 Last data filed at 12/25/15 1700  Gross per 24 hour  Intake  19000 ml  Output  21310 ml  Net  -2310 ml     Physical Exam: General:  elderly gentleman, laying and had   HEENT  dry mucous membranes   Neck  supple   Pulm/lungs  normal respiratory effort   CVS/Heart  no rub or gallop   Abdomen:   soft, nontender   Extremities:  no peripheral edema   Neurologic:  alert, able to answer questions   Skin:  warm, dry   Access:     Foley with CBI     Basic Metabolic Panel:   Recent Labs Lab 12/22/15 1055 12/23/15 0150 12/24/15 1357 12/25/15 0618  NA 138 139 133* 131*  K 4.3 4.0 4.8 4.3  CL 106 109 104 104  CO2 23 24 21* 19*  GLUCOSE 99 103* 96 104*  BUN 41* 37* 36* 47*  CREATININE 1.99* 1.73* 2.30* 3.24*  CALCIUM 8.4* 8.1* 8.1* 8.1*     CBC:  Recent Labs Lab 12/22/15 1055  12/23/15 0150 12/23/15 0919 12/23/15 1803 12/24/15 0508 12/25/15 0618  WBC 5.9  --  5.5  --   --  8.4  --   HGB 9.5*  < > 9.6* 10.3* 9.6* 9.5* 9.1*  HCT 28.6*  --  29.1*  --   --  28.4*  --   MCV 91.9  --   93.8  --   --  92.9  --   PLT 155  --  140*  --   --  147*  --   < > = values in this interval not displayed.    Microbiology:  No results found for this or any previous visit (from the past 720 hour(s)).  Coagulation Studies: No results for input(s): LABPROT, INR in the last 72 hours.  Urinalysis: No results for input(s): COLORURINE, LABSPEC, PHURINE, GLUCOSEU, HGBUR, BILIRUBINUR, KETONESUR, PROTEINUR, UROBILINOGEN, NITRITE, LEUKOCYTESUR in the last 72 hours.  Invalid input(s): APPERANCEUR    Imaging: Dg Chest 1 View  12/24/2015  CLINICAL DATA:  Hypoxia EXAM: CHEST 1 VIEW COMPARISON:  03/29/2015 FINDINGS: There is a left chest wall pacer device with leads in the right atrial appendage and right ventricle. Previous median sternotomy and CABG procedure. There is an indeterminate opacity overlying the a right hilar region. There is also fullness noted within the left hilar region. Mediastinal mass cannot be excluded. Recommend further evaluation with upright PA and lateral chest radiograph. IMPRESSION: 1. Indeterminate opacity overlying the right hilar region is  of on certain significance. Upright PA and lateral chest radiograph is recommended as an initial assessment. These results will be called to the ordering clinician or representative by the Radiologist Assistant, and communication documented in the PACS or zVision Dashboard. Electronically Signed   By: Kerby Moors M.D.   On: 12/24/2015 10:23   Dg Chest 2 View  12/25/2015  CLINICAL DATA:  History of bladder malignancy, now with hematuria; hypoxia, hypo name training EM, gradually progressive anemia and fatigue over the past several weeks EXAM: CHEST  2 VIEW COMPARISON:  Portable chest x-ray of December 24, 2015 FINDINGS: The lungs are well-expanded. The density in the right perihilar and infrahilar region is less conspicuous on this study today. There is hazy density that projects in the periphery of the left mid lung which is felt to  reflect overlap with the scapula. The cardiac silhouette is top-normal in size. The central pulmonary vascularity remains mildly engorged with slightly decreased cephalization. There is no large pleural effusion but a small amount of pleural fluid layering posteriorly, bilaterally is suspected. The permanent pacemaker is in reasonable position. There are 7 intact sternal wires from previous CABG. IMPRESSION: Decreased pulmonary interstitial edema secondary to CHF. Small bilateral pleural effusions layering posteriorly. There is no alveolar pneumonia. Electronically Signed   By: David  Martinique M.D.   On: 12/25/2015 13:31     Medications:     . docusate sodium  100 mg Oral BID  . pantoprazole  40 mg Oral QAC breakfast  . polyethylene glycol  17 g Oral Daily  . simvastatin  10 mg Oral QHS  . sodium chloride  3 mL Intravenous Q12H  . sotalol  80 mg Oral BID   acetaminophen **OR** acetaminophen, hydrocortisone, morphine injection, ondansetron **OR** ondansetron (ZOFRAN) IV, opium-belladonna, oxyCODONE, polyethylene glycol, simethicone  Assessment/ Plan:  82 y.o. 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 Acute renal failure on chronic kidney disease stage III. Baseline creatinine 1.7/GFR of 34 at the time of admission Cause for acute renal failure is unclear at this time We'll probably need renal imaging in the form of ultrasound to rule out hydronephrosis Continue to monitor electrolytes and renal function on a daily basis No acute indication for dialysis at present  2. Hematuria secondary to radiation cystitis Patient is on continuous bladder irrigation at present Followed by Dr Yves Dill     LOS: Austin Contreras 1/9/20175:47 PM

## 2015-12-25 NOTE — Progress Notes (Signed)
[facility] [place] NAME: Austin Contreras    MR#:  [id number]  DATE OF BIRTH:  Sep 04, 1926  SUBJECTIVE:    Patient still having a significant amount of hematuria  REVIEW OF SYSTEMS:    Review of Systems  Constitutional: Negative for fever, chills and malaise/fatigue.  HENT: Negative for sore throat.   Eyes: Negative for blurred vision.  Respiratory: Negative for cough, hemoptysis, shortness of breath and wheezing.   Cardiovascular: Negative for chest pain, palpitations and leg swelling.  Gastrointestinal: Negative for nausea, vomiting, abdominal pain, diarrhea and blood in stool.  Genitourinary: Positive for hematuria. Negative for dysuria.  Musculoskeletal: Negative for back pain.  Neurological: Negative for dizziness, tremors and headaches.  Endo/Heme/Allergies: Does not bruise/bleed easily.    Tolerating Diet: Yes      DRUG ALLERGIES:   Allergies  Allergen Reactions  . Penicillins Rash and Other (See Comments)    Has patient had a PCN reaction causing immediate rash, facial/tongue/throat swelling, SOB or lightheadedness with hypotension: No Has patient had a PCN reaction causing severe rash involving mucus membranes or skin necrosis: No Has patient had a PCN reaction that required hospitalization No Has patient had a PCN reaction occurring within the last 10 years: No If all of the above answers are "NO", then may proceed with Cephalosporin use.    VITALS:  Blood pressure 116/57, pulse 69, temperature 98.2 F (36.8 C), temperature source Oral, resp. rate 22, height 5\' 10"  (1.778 m), weight 77.565 kg (171 lb), SpO2 93 %.  PHYSICAL EXAMINATION:   Physical Exam  Constitutional: He is oriented to person, place, and time. No distress.  HENT:  Head: Normocephalic.  Eyes: No scleral icterus.  Neck: Normal range of motion. Neck supple. No JVD present. No tracheal deviation present.  Cardiovascular: Normal rate, regular  rhythm and normal heart sounds.  Exam reveals no gallop and no friction rub.   No murmur heard. Pulmonary/Chest: Effort normal and breath sounds normal. No respiratory distress. He has no wheezes. He has no rales. He exhibits no tenderness.  Abdominal: Soft. Bowel sounds are normal. He exhibits no distension and no mass. There is no tenderness. There is no rebound and no guarding.  Musculoskeletal: Normal range of motion. He exhibits no edema.  Neurological: He is alert and oriented to person, place, and time.  Skin: Skin is warm. No rash noted. No erythema.  Psychiatric: Affect and judgment normal.      LABORATORY PANEL:   CBC  Recent Labs Lab 12/24/15 0508 12/25/15 0618  WBC 8.4  --   HGB 9.5* 9.1*  HCT 28.4*  --   PLT 147*  --    ------------------------------------------------------------------------------------------------------------------  Chemistries   Recent Labs Lab 12/22/15 1055  12/25/15 0618  NA 138  < > 131*  K 4.3  < > 4.3  CL 106  < > 104  CO2 23  < > 19*  GLUCOSE 99  < > 104*  BUN 41*  < > 47*  CREATININE 1.99*  < > 3.24*  CALCIUM 8.4*  < > 8.1*  AST 40  --   --   ALT 124*  --   --   ALKPHOS 104  --   --   BILITOT 1.7*  --   --   < > = values in this interval not displayed. ------------------------------------------------------------------------------------------------------------------  Cardiac Enzymes No results for input(s): TROPONINI in the last 168 hours. ------------------------------------------------------------------------------------------------------------------  RADIOLOGY:  Dg Chest 1  View  12/24/2015  CLINICAL DATA:  Hypoxia EXAM: CHEST 1 VIEW COMPARISON:  03/29/2015 FINDINGS: There is a left chest wall pacer device with leads in the right atrial appendage and right ventricle. Previous median sternotomy and CABG procedure. There is an indeterminate opacity overlying the a right hilar region. There is also fullness noted within the  left hilar region. Mediastinal mass cannot be excluded. Recommend further evaluation with upright PA and lateral chest radiograph. IMPRESSION: 1. Indeterminate opacity overlying the right hilar region is of on certain significance. Upright PA and lateral chest radiograph is recommended as an initial assessment. These results will be called to the ordering clinician or representative by the Radiologist Assistant, and communication documented in the PACS or zVision Dashboard. Electronically Signed   By: Kerby Moors M.D.   On: 12/24/2015 10:23     ASSESSMENT AND PLAN:   82 year old male with a history of radiation proctitis and radiation cystitis with muscle invasive bladder cancer who presents with hematuria.  1. Hematuria: Patient has a large-bore 3-way Foley catheter with continuous bladder irrigation which will be continued for now as he has a little bit of blood in urine. I have spoken with Dr Rogers Blocker about patient. Patient has three options 1) continue with FOLEY 2) bilateral nephrostomy tubes or Palliative care. He essentially has end stage radiation cystitis according to Dr Rogers Blocker.  Family and patient to decide on this. I will consult Palliative care to discuss goals of care. Hgb stable today.  2. Acute blood loss anemia: This is from hematuria. Patient is status post 1 unit PRBCs. Hemoglobin remained stable. Patient does follow up at the cancer center for B12 injections.  3. Atrial fibrillation: Continue sotalol. Patient is not on anticoagulation due to history of GI bleed and hematuria.  4. Essential hypertension: Blood pressure improved. Restart ACEI.  5. Acute renal failure: Patient's creatinine is increasing. Consult nephrology for further recommendations.    6. Hypoxia: Obtain PA and lateral chest x-ray   Management plans discussed with the patient and he is in agreement.  CODE STATUS: Full.  TOTAL TIME TAKING CARE OF THIS PATIENT: 22 minutes.   Physical therapy recommends  home health PT  Patient has very poor overall prognosis. Further recommendations as per palliative care consult.  Gretel Cantu M.D on 12/25/2015 at 11:58 AM  Between 7am to 6pm - Pager - [phone] After 6pm go to [email or web address] - password [email or web address] [facility]  Office  [phone]  CC: Primary care physician; [name], MD  Note: This dictation was prepared with Dragon dictation along with smaller phrase technology. Any transcriptional errors that result from this process are unintentional.

## 2015-12-25 NOTE — Care Management Important Message (Signed)
Important Message  Patient Details  Name: Austin Contreras MRN: [id number] Date of Birth: Mar 23, 1926   Medicare Important Message Given:  Yes    [name] A Calisa Luckenbaugh 12/25/2015, 2:23 PM

## 2015-12-25 NOTE — Progress Notes (Signed)
Austin Contreras is a 82 y.o. male patient. 1. Hematuria   2. Acute blood loss anemia   3. Hypoxia   4.      End-stage radiation cystitis  Current Facility-Administered Medications  Medication Dose Route Frequency Provider Last Rate Last Dose  . acetaminophen (TYLENOL) tablet 650 mg  650 mg Oral Q6H PRN Gladstone Lighter, MD       Or  . acetaminophen (TYLENOL) suppository 650 mg  650 mg Rectal Q6H PRN Gladstone Lighter, MD      . docusate sodium (COLACE) capsule 100 mg  100 mg Oral BID Bettey Costa, MD      . hydrocortisone (ANUSOL-HC) suppository 25 mg  25 mg Rectal BID PRN Gladstone Lighter, MD      . morphine 2 MG/ML injection 1 mg  1 mg Intravenous Q4H PRN Bettey Costa, MD   1 mg at 12/24/15 1713  . ondansetron (ZOFRAN) tablet 4 mg  4 mg Oral Q6H PRN Gladstone Lighter, MD       Or  . ondansetron (ZOFRAN) injection 4 mg  4 mg Intravenous Q6H PRN Gladstone Lighter, MD      . opium-belladonna (B&O SUPPRETTES) 16.2-60 MG suppository 1 suppository  1 suppository Rectal Q6H PRN Gladstone Lighter, MD   1 suppository at 12/25/15 0226  . oxyCODONE (Oxy IR/ROXICODONE) immediate release tablet 5 mg  5 mg Oral Q4H PRN Gladstone Lighter, MD   5 mg at 12/25/15 1017  . pantoprazole (PROTONIX) EC tablet 40 mg  40 mg Oral QAC breakfast Gladstone Lighter, MD   40 mg at 12/25/15 1017  . polyethylene glycol (MIRALAX / GLYCOLAX) packet 17 g  17 g Oral Daily PRN Gladstone Lighter, MD      . polyethylene glycol (MIRALAX / GLYCOLAX) packet 17 g  17 g Oral Daily Sital Mody, MD      . simethicone (MYLICON) chewable tablet 80 mg  80 mg Oral BID PRN Bettey Costa, MD   80 mg at 12/25/15 1017  . simvastatin (ZOCOR) tablet 10 mg  10 mg Oral QHS Gladstone Lighter, MD   10 mg at 12/24/15 2140  . sodium chloride 0.9 % injection 3 mL  3 mL Intravenous Q12H Gladstone Lighter, MD   3 mL at 12/25/15 1019  . sotalol (BETAPACE) tablet 80 mg  80 mg Oral BID Gladstone Lighter, MD   80 mg at 12/25/15 1017   Allergies  Allergen  Reactions  . Penicillins Rash and Other (See Comments)    Has patient had a PCN reaction causing immediate rash, facial/tongue/throat swelling, SOB or lightheadedness with hypotension: No Has patient had a PCN reaction causing severe rash involving mucus membranes or skin necrosis: No Has patient had a PCN reaction that required hospitalization No Has patient had a PCN reaction occurring within the last 10 years: No If all of the above answers are "NO", then may proceed with Cephalosporin use.   Active Problems:   Hematuria  Blood pressure 118/52, pulse 69, temperature 97.9 F (36.6 C), temperature source Oral, resp. rate 17, height 5\' 10"  (1.778 m), weight 77.565 kg (171 lb), SpO2 94 %.  Subjective: Pelvic pain and bladder spasms Objective: Urine lightly blood-tinged off CBI Assessment & Plan :  1. End-stage radiation cystitis  Treatment options discussed with patient and family included chronic indwelling Foley with monthly exchanges, Foley catheter removal, and bilateral nephrostomy tube placement with monthly exchanges. Advantages and disadvantages of each option were thoroughly reviewed with them. Hyperbaric oxygen treatments may also be  considered but would be palliative and not immediately effective. I also mentioned the possibility of transfer to a tertiary care referral Center to determine if he was a candidate for any experimental options.  If patient chooses to continue with a chronic indwelling Foley, please have him follw-up in the office in one month for catheter exchange. If nephrostomy tubes are chosen, please schedule this with interventional radiology. He has not yet made a decision,  2. Pelvic pain  Patient may require chronic pain management 3. Bladder spasms  B and O suppositories or anticholinergic medications         Madalin Hughart R 12/25/2015

## 2015-12-26 ENCOUNTER — Inpatient Hospital Stay: Payer: Medicare Other

## 2015-12-26 ENCOUNTER — Other Ambulatory Visit: Payer: Medicare Other

## 2015-12-26 LAB — CBC
HEMATOCRIT: 26.6 % — AB (ref 40.0–52.0)
HEMOGLOBIN: 8.8 g/dL — AB (ref 13.0–18.0)
MCH: 30.8 pg (ref 26.0–34.0)
MCHC: 33.3 g/dL (ref 32.0–36.0)
MCV: 92.4 fL (ref 80.0–100.0)
PLATELETS: 191 10*3/uL (ref 150–440)
RBC: 2.87 MIL/uL — AB (ref 4.40–5.90)
RDW: 15.8 % — ABNORMAL HIGH (ref 11.5–14.5)
WBC: 10.6 10*3/uL (ref 3.8–10.6)

## 2015-12-26 LAB — BASIC METABOLIC PANEL
ANION GAP: 8 (ref 5–15)
BUN: 53 mg/dL — ABNORMAL HIGH (ref 6–20)
CO2: 21 mmol/L — ABNORMAL LOW (ref 22–32)
Calcium: 8.2 mg/dL — ABNORMAL LOW (ref 8.9–10.3)
Chloride: 107 mmol/L (ref 101–111)
Creatinine, Ser: 3.71 mg/dL — ABNORMAL HIGH (ref 0.61–1.24)
GFR, EST AFRICAN AMERICAN: 16 mL/min — AB (ref >60)
GFR, EST NON AFRICAN AMERICAN: 13 mL/min — AB (ref >60)
GLUCOSE: 100 mg/dL — AB (ref 65–99)
POTASSIUM: 4.8 mmol/L (ref 3.5–5.1)
Sodium: 136 mmol/L (ref 135–145)

## 2015-12-26 MED ORDER — SODIUM CHLORIDE 0.9 % IV SOLN
INTRAVENOUS | Status: DC
  Administered 2015-12-26 – 2016-01-01 (×8): via INTRAVENOUS

## 2015-12-26 NOTE — Consult Note (Signed)
Palliative Medicine Inpatient Consult Note   Name: Austin Contreras Date: 12/26/2015 MRN: [id number]  DOB: Jul 27, 1926  Referring Physician: Bettey Costa, MD  Palliative Care consult requested for this 82 y.o. male for goals of medical therapy in patient with Hematuria and other problems as detailed below.     TODAY'S DISCUSIONS AND DECISIONS: Pt is quite medically complex.  The entire chart and past records were reviewed to compile the list of problems as detailed below.  Pt had been given three options by Dr. Yves Dill (as listed below). Dr Candiss Norse was consulted due to increasing creatinine and this showed bilateral hydronephrosis.  Dr Candiss Norse spoke with pt and son and daughter of pt.  The decision was made at that time to go ahead with bilateral nephrostomy tube placement by vascular radiology. Dr Yves Dill has commented that pt will need to follow up with Dr. Candiss Norse to schedule tube exchange monthly with interventional radiology --this is b/c stent placement is NOT possible b/c ureteral orifices were not visible due to radiation cystitis / fibrosis.  Nephrostomy tube placement is planned for tomorrow.   However, the evening nurse said that family waited for a while to 'sign a consent form' but no form was forthcoming.  They have gone and the pt is exhausted and medicated and I won't be waking him for a discussion of goals of care tonight.    I do plan on returning to talk with pt and family -- but the decision to place the nephrostomy tubes has been made and that will likely proceed once the consent matter gets figured out.  Pt is NPO after midnight so he will at least be ready for this to be done tomorrow.  I just want to make sure family is aware that pt has multiple comorbid conditions and he will benefit from having some comfort focus built into whatever management plan is devised to address his obstructive uropathy and the end-stage bladder fibrosis and cystitis.  I will address code status when I can also.      ------------------------    IMPRESSION: 1. END STAGE  Radiation Cystitis with hematuria ---continuous bladder irrigation  ---present since he had bladder tumor resection Nov 2016 ---variable (grossly bloody at times and minimally bloody at other times) ---recently consistently very bloody with frequency (q 30 min) and anemia ---he had been set up for hyperbaric oxygen treatments but could not complete first treatment due to diarrhea (due to radiation proctitis). Diarrhea has since improved. ---Pt had cystoscopy this admission by Dr. Yves Dill on 12/22/14 showing multiple clots as well as severe radiation bladder fibrosis with capacity of only 50 cc.  Catheter was placed. ---Dr Yves Dill presented 3 options to pt:     1---continue foley with monthly exchanges    2 --bilateral nephrostomy tubes with foley removal and monthly exchanges    3---Palliative (comfort) care.   2.  Bladder Cancer (muscle invasive) ---stage 2 diagnosed in March 2015 ---TURBP in March 2015 ---He had bladder sparing treatment using external beam radiation therapy completed in July 2015.  ---Surveillance Cystoscopy in October 2016 revealed a 5 by 5 mm tumor on the left side of the prostate proximal to the left ureteral orifice.  (described as a superficial non-invasive bladder tumor with no chemo or radiation indicated at this point)   ---The tumor found in Oct 2016 was resected Oct 17, 2015 with intravesical mitomycin given   3.  Anemia ---transfused one unit thus far  ---due to acute blood loss from hematuria  last couple of weeks  ---he also has chronic B12 deficiency and also MGUS (which can contribute to a chronic anemia)  4.  Acute on chronic renal fallure ---Cr was up to 1.99 at adm ---Baseline Cr is about 1.7 (stage 3) ---Cr went up to 3.24 on 01/09.  5.  Bilateral hydronephrosis --- Per [place] report:  This may be secondary to mid to lower ureteral stones or obstruction at the level of the  ureterovesical junctions. The urinary bladder could not be adequately assessed. Cystoscopy is recommended.There is a 1 cm diameter midpole stone on the left. There are tiny cysts in both kidneys   6.  Pulmonary Edema  7.  Small bilateral pleural effusions   Other diagnoses:  Fatigue GERD HTN CAD --with h/o CABG Sept 2013 Cardiac Dysrhythmias  ---s/p Pacemaker Sept 2013 H/O Aortic Valve replacement with porcine valve Sept 2013.   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 Rudean Hitt in cancer clinic for this in Dec 2016  ---to be monitored yearly for this Poor oral intake    SYMPTOMS: Pelvic pain, bladder spasms (treated with B and O suppositories), frequency (when no foley in place), rectal pain from proctitis, and hypoxia    REVIEW OF SYSTEMS:         SPIRITUAL SUPPORT SYSTEM: Yes.  SOCIAL HISTORY:  reports that he has quit smoking. His smoking use included Cigarettes. He has never used smokeless tobacco. He reports that he does not drink alcohol or use illicit drugs.  Former smoker who smoked 1 ppd for 30 years and quit 30 yrs ago.   LEGAL DOCUMENTS:  none  CODE STATUS: Full code  PAST MEDICAL HISTORY: Past Medical History  Diagnosis Date  . B12 deficiency anemia 08/02/2015  . Bladder cancer ([place])     stage 2 s/p TUBTR  . GERD (gastroesophageal reflux disease)   . Hypertension   . PONV  (postoperative nausea and vomiting)   . Hyperlipidemia   . Coronary artery disease   . H/O aortic valve replacement with porcine valve   . BPH (benign prostatic hyperplasia)   . A-fib ([place])   . SVT (supraventricular tachycardia) ([place])   . CKD (chronic kidney disease) stage 3, GFR 30-59 ml/min     PAST SURGICAL HISTORY:  Past Surgical History  Procedure Laterality Date  . Appendectomy    . Coronary artery bypass graft    . Cataracts    . Cataract extraction, bilateral    . Transurethral resection of bladder tumor N/A 10/17/2015    Procedure: TRANSURETHRAL RESECTION OF BLADDER TUMOR (TURBT);  Surgeon: Royston Cowper, MD;  Location: [facility] ORS;  Service: Urology;  Laterality: N/A;  . Aortic valve replacement    . Pacemaker insertion    . Hand surgery      after trauma  . Rotator cuff repair      right shoulder  . Prepatellar bursa excision    . Cystoscopy with fulgeration Bilateral 12/23/2015    Procedure:  CYSTOSCOPY WITH FULGERATION;  Surgeon: Royston Cowper, MD;  Location: [facility] ORS;  Service: Urology;  Laterality: Bilateral;    ALLERGIES:  is allergic to penicillins.  MEDICATIONS:  Current Facility-Administered Medications  Medication Dose Route Frequency Provider Last Rate Last Dose  . 0.9 %  sodium chloride infusion   Intravenous Continuous Murlean Iba, MD 50 mL/hr at 12/26/15 1434    . acetaminophen (TYLENOL) tablet 650 mg  650 mg Oral Q6H PRN Gladstone Lighter, MD       Or  . acetaminophen (TYLENOL) suppository 650 mg  650 mg Rectal Q6H PRN Gladstone Lighter, MD      . docusate sodium (COLACE) capsule 100 mg  100 mg Oral BID Bettey Costa, MD   100 mg at 12/26/15 1132  . feeding supplement (NEPRO CARB STEADY) liquid 237 mL  237 mL Oral BID BM Harmeet Singh, MD   237 mL at 12/26/15 1409  . hydrocortisone (ANUSOL-HC) suppository 25 mg  25 mg Rectal BID PRN Gladstone Lighter, MD      . morphine 2 MG/ML injection 1 mg  1 mg Intravenous Q4H PRN Bettey Costa, MD   1 mg at 12/26/15  1249  . ondansetron (ZOFRAN) tablet 4 mg  4 mg Oral Q6H PRN Gladstone Lighter, MD       Or  . ondansetron (ZOFRAN) injection 4 mg  4 mg Intravenous Q6H PRN Gladstone Lighter, MD      . opium-belladonna (B&O SUPPRETTES) 16.2-60 MG suppository 1 suppository  1 suppository Rectal Q6H PRN Gladstone Lighter, MD   1 suppository at 12/26/15 1406  . oxyCODONE (Oxy IR/ROXICODONE) immediate release tablet 5 mg  5 mg Oral Q4H PRN Gladstone Lighter, MD   5 mg at 12/26/15 1130  . pantoprazole (PROTONIX) EC tablet 40 mg  40 mg Oral QAC breakfast Gladstone Lighter, MD   40 mg at 12/26/15 0739  . polyethylene glycol (MIRALAX / GLYCOLAX) packet 17 g  17 g Oral Daily PRN Gladstone Lighter, MD      . polyethylene glycol (MIRALAX / GLYCOLAX) packet 17 g  17 g Oral Daily Bettey Costa, MD   17 g at 12/26/15 1132  . simethicone (MYLICON) chewable tablet 80 mg  80 mg Oral BID PRN Bettey Costa, MD   80 mg at 12/25/15 1017  . simvastatin (ZOCOR) tablet 10 mg  10 mg Oral QHS Gladstone Lighter, MD   10 mg at 12/25/15 2110  . sodium chloride 0.9 % injection 3 mL  3 mL Intravenous Q12H Gladstone Lighter, MD   3 mL at 12/26/15 1000  . sotalol (BETAPACE) tablet 80 mg  80 mg Oral BID Gladstone Lighter, MD   80 mg at 12/26/15 1132    Vital Signs: BP 136/72 mmHg  Pulse 71  Temp(Src) 98 F (36.7 C) (Oral)  Resp 17  Ht 5\' 10"  (1.778 m)  Wt 77.565 kg (171 lb)  BMI 24.54 kg/m2  SpO2 99% Filed Weights   12/22/15 1048  Weight: 77.565 kg (171 lb)    Estimated body mass index is 24.54 kg/(m^2) as calculated from the following:   Height as of this encounter: 5\' 10"  (1.778 m).   Weight as of this encounter: 77.565 kg (171 lb).  PERFORMANCE STATUS (ECOG) : 3 - Symptomatic, >50% confined to bed  PHYSICAL EXAM:  Sleeping with an occasional grimace noted He is restless Eyes closed No JVD seen this evening Hrt irreg irreg Lungs cta ant Abd soft and NT Ext no cyanosis or mottling CBI underway  and foley has dark pink colored  urine in it       LABS: CBC:    Component Value Date/Time   WBC 10.6 12/26/2015 0416   WBC 4.6 04/05/2015 1041   HGB 8.8* 12/26/2015 0416   HGB 12.2* 04/05/2015 1041   HCT 26.6* 12/26/2015 0416   HCT 37.4* 04/05/2015 1041   PLT 191 12/26/2015 0416   PLT 162 04/05/2015 1041   MCV 92.4 12/26/2015 0416   MCV 93 04/05/2015 1041   NEUTROABS 4.8 12/06/2015 1016   NEUTROABS 2.7 04/05/2015 1041   LYMPHSABS 0.8* 12/06/2015 1016   LYMPHSABS 1.0 04/05/2015 1041   MONOABS 0.8 12/06/2015 1016   MONOABS 0.6 04/05/2015 1041   EOSABS 0.2 12/06/2015 1016   EOSABS 0.2 04/05/2015 1041   BASOSABS 0.0 12/06/2015 1016   BASOSABS 0.0 04/05/2015 1041   Comprehensive Metabolic Panel:    Component Value Date/Time   NA 136 12/26/2015 0416   NA 136 09/14/2014 1046   K 4.8 12/26/2015 0416   K 5.0 09/14/2014 1046   CL 107 12/26/2015 0416   CL 102 09/14/2014 1046   CO2 21* 12/26/2015 0416   CO2 25 09/14/2014 1046   BUN 53* 12/26/2015 0416   BUN 30* 09/14/2014 1046   CREATININE 3.71* 12/26/2015 0416   CREATININE 1.82* 04/05/2015 1041   GLUCOSE 100* 12/26/2015 0416   GLUCOSE 96 09/14/2014 1046   CALCIUM 8.2* 12/26/2015 0416   CALCIUM 8.9 09/14/2014 1046   AST 40 12/22/2015 1055   AST 15 08/01/2014 1308   ALT 124* 12/22/2015 1055   ALT 18 08/01/2014 1308   ALKPHOS 104 12/22/2015 1055   ALKPHOS 59 08/01/2014 1308   BILITOT 1.7* 12/22/2015 1055   BILITOT 0.4 08/01/2014 1308   PROT 6.9 12/22/2015 1055   PROT 7.4 08/01/2014 1308   ALBUMIN 3.7 12/22/2015 1055   ALBUMIN 3.7 08/01/2014 1308   TESTS  Renal US: Right Kidney:Length: 11.3 cm. There is moderate hydronephrosis and proximal hydroureter. In the midpole there is a hypoechoic anechoic structure measuring 9 mm in diameter most compatible with a cyst.  Left Kidney:Length: 11.8 cm. There is moderate hydronephrosis. There is a mid pole shadowing calcification measuring 1 cm in diameter. There is an exophytic mid upper pole  cortical structure measuring 1.1 x 0.8 x 1.2 cm.  Bladder: Decompressed by Foley catheter.  IMPRESSION: 1. Bilateral hydronephrosis. This may be secondary to mid to lower ureteral stones or obstruction at the level of the ureterovesical junctions. The urinary bladder could not be adequately assessed. Cystoscopy is recommended. 2. There is a 1 cm diameter midpole stone on the left. There are tiny cysts in both kidneys.  More than 50% of the visit was spent in counseling/coordination of care: Yes  Time Spent:  80 minutes

## 2015-12-26 NOTE — Progress Notes (Signed)
Subjective:  Patient is known to our practice from outpatient. He is usually followed by Dr. Holley Raring. He was last seen in October 2016  He was admitted on January 6 for hematuria He was seen by urologist and placed on continuous bladder irrigation Hospital course is also complicated by acute renal failure with creatinine rising to 3.71 Admission creatinine was 1.73/GFR of 34 Kidney U./S shows hydronephrosis    Objective:  Vital signs in last 24 hours:  Temp:  [97.6 F (36.4 C)-98 F (36.7 C)] 98 F (36.7 C) (01/10 0625) Pulse Rate:  [70-75] 71 (01/10 1242) Resp:  [16-18] 17 (01/10 1242) BP: (116-136)/(62-72) 136/72 mmHg (01/10 1242) SpO2:  [95 %-99 %] 99 % (01/10 1242)  Weight change:  Filed Weights   12/22/15 1048  Weight: 77.565 kg (171 lb)    Intake/Output:    Intake/Output Summary (Last 24 hours) at 12/26/15 1322 Last data filed at 12/26/15 1135  Gross per 24 hour  Intake  15499 ml  Output  15260 ml  Net    239 ml     Physical Exam: General:  elderly gentleman, laying and had   HEENT  dry mucous membranes   Neck  supple   Pulm/lungs  normal respiratory effort   CVS/Heart  no rub or gallop   Abdomen:   soft, nontender   Extremities:  no peripheral edema   Neurologic:  alert, able to answer questions   Skin:  warm, dry   Access:     Foley with CBI     Basic Metabolic Panel:   Recent Labs Lab 12/22/15 1055 12/23/15 0150 12/24/15 1357 12/25/15 0618 12/26/15 0416  NA 138 139 133* 131* 136  K 4.3 4.0 4.8 4.3 4.8  CL 106 109 104 104 107  CO2 23 24 21* 19* 21*  GLUCOSE 99 103* 96 104* 100*  BUN 41* 37* 36* 47* 53*  CREATININE 1.99* 1.73* 2.30* 3.24* 3.71*  CALCIUM 8.4* 8.1* 8.1* 8.1* 8.2*     CBC:  Recent Labs Lab 12/22/15 1055  12/23/15 0150 12/23/15 0919 12/23/15 1803 12/24/15 0508 12/25/15 0618 12/26/15 0416  WBC 5.9  --  5.5  --   --  8.4  --  10.6  HGB 9.5*  < > 9.6* 10.3* 9.6* 9.5* 9.1* 8.8*  HCT 28.6*  --  29.1*  --   --   28.4*  --  26.6*  MCV 91.9  --  93.8  --   --  92.9  --  92.4  PLT 155  --  140*  --   --  147*  --  191  < > = values in this interval not displayed.    Microbiology:  No results found for this or any previous visit (from the past 720 hour(s)).  Coagulation Studies: No results for input(s): LABPROT, INR in the last 72 hours.  Urinalysis: No results for input(s): COLORURINE, LABSPEC, PHURINE, GLUCOSEU, HGBUR, BILIRUBINUR, KETONESUR, PROTEINUR, UROBILINOGEN, NITRITE, LEUKOCYTESUR in the last 72 hours.  Invalid input(s): APPERANCEUR    Imaging: Dg Chest 2 View  12/25/2015  CLINICAL DATA:  History of bladder malignancy, now with hematuria; hypoxia, hypo name training EM, gradually progressive anemia and fatigue over the past several weeks EXAM: CHEST  2 VIEW COMPARISON:  Portable chest x-ray of December 24, 2015 FINDINGS: The lungs are well-expanded. The density in the right perihilar and infrahilar region is less conspicuous on this study today. There is hazy density that projects in the periphery of the left  mid lung which is felt to reflect overlap with the scapula. The cardiac silhouette is top-normal in size. The central pulmonary vascularity remains mildly engorged with slightly decreased cephalization. There is no large pleural effusion but a small amount of pleural fluid layering posteriorly, bilaterally is suspected. The permanent pacemaker is in reasonable position. There are 7 intact sternal wires from previous CABG. IMPRESSION: Decreased pulmonary interstitial edema secondary to CHF. Small bilateral pleural effusions layering posteriorly. There is no alveolar pneumonia. Electronically Signed   By: David  Martinique M.D.   On: 12/25/2015 13:31   US Renal  12/26/2015  CLINICAL DATA:  Three weeks of gross hematuria and hydronephrosis. History of bladder malignancy and kidney stones. EXAM: RENAL / URINARY TRACT ULTRASOUND COMPLETE COMPARISON:  Abdominal ultrasound of April 09, 2013 and CT scan  of the abdomen pelvis of March 10 2014. FINDINGS: Right Kidney: Length: 11.3 cm. There is moderate hydronephrosis and proximal hydroureter. In the midpole there is a hypoechoic anechoic structure measuring 9 mm in diameter most compatible with a cyst. Left Kidney: Length: 11.8 cm. There is moderate hydronephrosis. There is a mid pole shadowing calcification measuring 1 cm in diameter. There is an exophytic mid upper pole cortical structure measuring 1.1 x 0.8 x 1.2 cm. Bladder: Decompressed by Foley catheter. IMPRESSION: 1. Bilateral hydronephrosis. This may be secondary to mid to lower ureteral stones or obstruction at the level of the ureterovesical junctions. The urinary bladder could not be adequately assessed. Cystoscopy is recommended. 2. There is a 1 cm diameter midpole stone on the left. There are tiny cysts in both kidneys. Electronically Signed   By: David  Martinique M.D.   On: 12/26/2015 11:39     Medications:   . sodium chloride 75 mL/hr at 12/26/15 0754   . docusate sodium  100 mg Oral BID  . feeding supplement (NEPRO CARB STEADY)  237 mL Oral BID BM  . pantoprazole  40 mg Oral QAC breakfast  . polyethylene glycol  17 g Oral Daily  . simvastatin  10 mg Oral QHS  . sodium chloride  3 mL Intravenous Q12H  . sotalol  80 mg Oral BID   acetaminophen **OR** acetaminophen, hydrocortisone, morphine injection, ondansetron **OR** ondansetron (ZOFRAN) IV, opium-belladonna, oxyCODONE, polyethylene glycol, simethicone  Assessment/ Plan:  82 y.o. 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 Acute renal failure on chronic kidney disease stage III. Baseline creatinine 1.7/GFR of 34 at the time of admission Cause for acute renal failure is likely b/l hydronephrosis Case discussed in detail with patient, his daughter came, his son and his daughter and  wife After detailed discussion, the consensus was to proceed with nephrostomy tube placement by vascular radiology as he have added limited options in this situation Discussed with ultrasound, patient's procedure will be done tomorrow  2. Hematuria secondary to radiation cystitis Patient is on continuous bladder irrigation at present Followed by Dr Yves Dill     LOS: [place] 1/10/20171:22 PM

## 2015-12-26 NOTE — Plan of Care (Signed)
Problem: Pain Managment: Goal: General experience of comfort will improve Outcome: Not Progressing Patient in pain due to bladder spasms.  Alternating oxycodone with morphine  Problem: Activity: Goal: Risk for activity intolerance will decrease Outcome: Not Progressing On bedrest

## 2015-12-26 NOTE — Progress Notes (Signed)
Patient has spoken with Dr .Candiss Norse about nephrostomy tubes and has consented. If tubes are placed,  he will need to follow-up with Dr. Candiss Norse to schedule tube exchange monthly with interventional radiology.  Stent placement is not possible because the ureteral orifices were not visible due to radiation cystitis/fibrosis.

## 2015-12-26 NOTE — OR Nursing (Signed)
Dr Candiss Norse notified that Bilateral nephrostomy tube placements could be scheduled for tomorrow afternoon at the earliest. He stated he was ok with that.

## 2015-12-26 NOTE — Progress Notes (Signed)
Austin Contreras at [place] NAME: Austin Contreras    MR#:  [id number]  DATE OF BIRTH:  1977/05/24  SUBJECTIVE:    Patient has bladder spasm.  REVIEW OF SYSTEMS:    Review of Systems  Constitutional: Negative for fever, chills and malaise/fatigue.  HENT: Negative for sore throat.   Eyes: Negative for blurred vision.  Respiratory: Negative for cough, hemoptysis, shortness of breath and wheezing.   Cardiovascular: Negative for chest pain, palpitations and leg swelling.  Gastrointestinal: Negative for nausea, vomiting, abdominal pain, diarrhea and blood in stool.  Genitourinary: Positive for hematuria. Negative for dysuria.  Musculoskeletal: Negative for back pain.  Neurological: Negative for dizziness, tremors and headaches.  Endo/Heme/Allergies: Does not bruise/bleed easily.    Tolerating Diet: Yes      DRUG ALLERGIES:   Allergies  Allergen Reactions  . Penicillins Rash and Other (See Comments)    Has patient had a PCN reaction causing immediate rash, facial/tongue/throat swelling, SOB or lightheadedness with hypotension: No Has patient had a PCN reaction causing severe rash involving mucus membranes or skin necrosis: No Has patient had a PCN reaction that required hospitalization No Has patient had a PCN reaction occurring within the last 10 years: No If all of the above answers are "NO", then may proceed with Cephalosporin use.    VITALS:  Blood pressure 118/62, pulse 70, temperature 98 F (36.7 C), temperature source Oral, resp. rate 18, height 5\' 10"  (1.778 m), weight 77.565 kg (171 lb), SpO2 95 %.  PHYSICAL EXAMINATION:   Physical Exam  Constitutional: He is oriented to person, place, and time. No distress.  HENT:  Head: Normocephalic.  Eyes: No scleral icterus.  Neck: Normal range of motion. Neck supple. No JVD present. No tracheal deviation present.  Cardiovascular: Normal rate, regular rhythm and normal heart sounds.   Exam reveals no gallop and no friction rub.   No murmur heard. Pulmonary/Chest: Effort normal and breath sounds normal. No respiratory distress. He has no wheezes. He has no rales. He exhibits no tenderness.  Abdominal: Soft. Bowel sounds are normal. He exhibits no distension and no mass. There is no tenderness. There is no rebound and no guarding.  Musculoskeletal: Normal range of motion. He exhibits no edema.  Neurological: He is alert and oriented to person, place, and time.  Skin: Skin is warm. No rash noted. No erythema.  Psychiatric: Affect and judgment normal.      LABORATORY PANEL:   CBC  Recent Labs Lab 12/26/15 0416  WBC 10.6  HGB 8.8*  HCT 26.6*  PLT 191   ------------------------------------------------------------------------------------------------------------------  Chemistries   Recent Labs Lab 12/22/15 1055  12/26/15 0416  NA 138  < > 136  K 4.3  < > 4.8  CL 106  < > 107  CO2 23  < > 21*  GLUCOSE 99  < > 100*  BUN 41*  < > 53*  CREATININE 1.99*  < > 3.71*  CALCIUM 8.4*  < > 8.2*  AST 40  --   --   ALT 124*  --   --   ALKPHOS 104  --   --   BILITOT 1.7*  --   --   < > = values in this interval not displayed. ------------------------------------------------------------------------------------------------------------------  Cardiac Enzymes No results for input(s): TROPONINI in the last 168 hours. ------------------------------------------------------------------------------------------------------------------  RADIOLOGY:  Dg Chest 2 View  12/25/2015  CLINICAL DATA:  History of bladder malignancy, now with hematuria; hypoxia, hypo  name training EM, gradually progressive anemia and fatigue over the past several weeks EXAM: CHEST  2 VIEW COMPARISON:  Portable chest x-ray of December 24, 2015 FINDINGS: The lungs are well-expanded. The density in the right perihilar and infrahilar region is less conspicuous on this study today. There is hazy density that  projects in the periphery of the left mid lung which is felt to reflect overlap with the scapula. The cardiac silhouette is top-normal in size. The central pulmonary vascularity remains mildly engorged with slightly decreased cephalization. There is no large pleural effusion but a small amount of pleural fluid layering posteriorly, bilaterally is suspected. The permanent pacemaker is in reasonable position. There are 7 intact sternal wires from previous CABG. IMPRESSION: Decreased pulmonary interstitial edema secondary to CHF. Small bilateral pleural effusions layering posteriorly. There is no alveolar pneumonia. Electronically Signed   By: David  Martinique M.D.   On: 12/25/2015 13:31     ASSESSMENT AND PLAN:   82 year old male with a history of radiation proctitis and radiation cystitis with muscle invasive bladder cancer who presents with hematuria.  1. Hematuria: Patient has a large-bore 3-way Foley catheter with continuous bladder irrigation which will be continued for now as he has a little bit of blood in urine. I have spoken with Dr Rogers Blocker about patient. Patient has three options 1) continue with FOLEY 2) bilateral nephrostomy tubes or Palliative care. He essentially has end stage radiation cystitis according to Dr Rogers Blocker.  Family and patient to decide on this. Up until today patient reports that he has not made a conclusive decision but would not want nephrostomy tubes. I will consult Palliative care to discuss goals of care. Hgb stable today.  2. Acute blood loss anemia: This is from hematuria. Patient is status post 1 unit PRBCs. Hemoglobin remained stable. Patient does follow up at the cancer center for B12 injections.  3. Atrial fibrillation: Continue sotalol. Patient is not on anticoagulation due to history of GI bleed and hematuria.  4. Essential hypertension: Blood pressure improved. Restart ACEI.  5. Acute renal failure: Patient's creatinine is increasing. I have ordered a renal  ultrasound. Nephrology consultation appreciated. I will start low-dose fluids and monitor creatinine. Hold nephrotoxic agents.  6. Bladder spasm: Continue B and O suppositories.  7. Hypoxia: Chest x-ray shows resolving interstitial edema with small bilateral pleural effusions.  Management plans discussed with the patient and he is in agreement.  CODE STATUS: Full.  TOTAL TIME TAKING CARE OF THIS PATIENT: 22 minutes.   Physical therapy recommends home health PT  Patient has very poor overall prognosis. Further recommendations as per palliative care consult.  Koreen Lizaola M.D on 12/26/2015 at 10:55 AM  Between 7am to 6pm - Pager - [phone] After 6pm go to [email or web address] - password [email or web address] [facility]  Office  [phone]  CC: Primary care physician; [name], MD  Note: This dictation was prepared with Dragon dictation along with smaller phrase technology. Any transcriptional errors that result from this process are unintentional.

## 2015-12-26 NOTE — Progress Notes (Signed)
PT Cancellation Note  Patient Details Name: Austin Contreras MRN: [id number] DOB: [birth year]   Cancelled Treatment:    Reason Eval/Treat Not Completed: Patient at procedure or test/unavailable. Pt currently out of room at this time, unavailable for therapy. Will re-attempt when available.   Kaizen Ibsen 12/26/2015, 11:15 AM Greggory Stallion, PT, DPT [phone]

## 2015-12-26 NOTE — OR Nursing (Signed)
Pt not NPO per nurse Ander Purpura, she stated that plan was to do nephrostomy tubes tomorrow. Dr Annamaria Boots informed of order for nephrostomy tubes and plan for holding blood thinners, npo and appropriate labs for tomorrow.

## 2015-12-27 ENCOUNTER — Other Ambulatory Visit: Payer: Medicare Other

## 2015-12-27 ENCOUNTER — Ambulatory Visit: Payer: Medicare Other

## 2015-12-27 DIAGNOSIS — N1330 Unspecified hydronephrosis: Secondary | ICD-10-CM

## 2015-12-27 DIAGNOSIS — J811 Chronic pulmonary edema: Secondary | ICD-10-CM

## 2015-12-27 DIAGNOSIS — C679 Malignant neoplasm of bladder, unspecified: Secondary | ICD-10-CM

## 2015-12-27 DIAGNOSIS — N3041 Irradiation cystitis with hematuria: Principal | ICD-10-CM

## 2015-12-27 DIAGNOSIS — D649 Anemia, unspecified: Secondary | ICD-10-CM

## 2015-12-27 DIAGNOSIS — N179 Acute kidney failure, unspecified: Secondary | ICD-10-CM

## 2015-12-27 LAB — CBC
HCT: 28.3 % — ABNORMAL LOW (ref 40.0–52.0)
HEMOGLOBIN: 9.2 g/dL — AB (ref 13.0–18.0)
MCH: 30.2 pg (ref 26.0–34.0)
MCHC: 32.6 g/dL (ref 32.0–36.0)
MCV: 92.5 fL (ref 80.0–100.0)
Platelets: 257 10*3/uL (ref 150–440)
RBC: 3.06 MIL/uL — AB (ref 4.40–5.90)
RDW: 15.9 % — ABNORMAL HIGH (ref 11.5–14.5)
WBC: 11.2 10*3/uL — ABNORMAL HIGH (ref 3.8–10.6)

## 2015-12-27 LAB — COMPREHENSIVE METABOLIC PANEL
ALBUMIN: 2.9 g/dL — AB (ref 3.5–5.0)
ALK PHOS: 81 U/L (ref 38–126)
ALT: 32 U/L (ref 17–63)
AST: 19 U/L (ref 15–41)
Anion gap: 11 (ref 5–15)
BUN: 65 mg/dL — ABNORMAL HIGH (ref 6–20)
CALCIUM: 8.5 mg/dL — AB (ref 8.9–10.3)
CO2: 18 mmol/L — AB (ref 22–32)
CREATININE: 4.29 mg/dL — AB (ref 0.61–1.24)
Chloride: 109 mmol/L (ref 101–111)
GFR calc Af Amer: 13 mL/min — ABNORMAL LOW (ref >60)
GFR calc non Af Amer: 11 mL/min — ABNORMAL LOW (ref >60)
GLUCOSE: 98 mg/dL (ref 65–99)
Potassium: 5.6 mmol/L — ABNORMAL HIGH (ref 3.5–5.1)
SODIUM: 138 mmol/L (ref 135–145)
Total Bilirubin: 2.5 mg/dL — ABNORMAL HIGH (ref 0.3–1.2)
Total Protein: 6.6 g/dL (ref 6.5–8.1)

## 2015-12-27 MED ORDER — ENSURE ENLIVE PO LIQD
237.0000 mL | Freq: Two times a day (BID) | ORAL | Status: DC
  Administered 2015-12-29 – 2016-01-01 (×5): 237 mL via ORAL

## 2015-12-27 NOTE — Progress Notes (Addendum)
Palliative Medicine Inpatient Consult Follow Up Note   Name: Austin Contreras Date: 12/27/2015 MRN: [id number]  DOB: 1926/04/28  Referring Physician: Bettey Costa, MD  Palliative Care consult requested for this 82 y.o. male for goals of medical therapy in patient with hematuria and other problems as outlined below.  He is medically complex.    TODAY'S DISCUSSIONS AND DECISIONS:  I talked with pt and also with son, Austin Contreras.   Pt is his own decision maker when awake and alert and not distracted by pain or other matters.   Family brought in a HCPOA form and a Living Will.  Pts wife is his HCPOA AND he does not want to be kept alive by artificial means if terminally ill, etc.  Pt expressed a need for more pain meds.  Son expressed a need to have the rate for the CBI reduced. This is b/c pt has increased pain with a higher rate.  The rate had been reduced earlier in the day, but then CBI just stopped.  So the rate was increased again.  Hopefully, with rate decreased again now by nursing (at my request), CBI can still continue and pt can also rest.  He fell asleep when this was decreased.  I spoke at length with son, Austin Contreras, about hope and reality.  Austin Contreras seems to have a good grasp of the issues facing his dad and he has hopes for his dad to have some good recovery and good days to months ahead, but he also knows that this could all go 'the other way' and pt might need or benefit from Hospice soon.  I said I would try to come talk to all of them (Pts wife, Austin Contreras, and the two daughters) tomorrow AFTER the nephrostomy tubes are placed (or attempted to be placed).  Will at that time clarify code status and talk about weaving some palliative care into whatever aggressive care he is going to be getting.    ---------------------------------------------------------------------------------------------------------  IMPRESSION: 1. END STAGE Radiation Cystitis with hematuria ---continuous bladder irrigation   ---present since he had bladder tumor resection Nov 2016 ---variable (grossly bloody at times and minimally bloody at other times) ---recently consistently very bloody with frequency (q 30 min) and anemia ---he had been set up for hyperbaric oxygen treatments but could not complete first treatment due to diarrhea (due to radiation proctitis). Diarrhea has since improved. ---Pt had cystoscopy this admission by Dr. Yves Dill on 12/22/14 showing multiple clots as well as severe radiation bladder fibrosis with capacity of only 50 cc. Catheter was placed. ---Dr Yves Dill presented 3 options to pt:   1---continue foley with monthly exchanges  2 --bilateral nephrostomy tubes with foley removal and monthly exchanges  3---Palliative (comfort) care.   2. Bladder Cancer (muscle invasive) ---stage 2 diagnosed in March 2015 ---TURBP in March 2015 ---He had bladder sparing treatment using external beam radiation therapy completed in July 2015.  ---Surveillance Cystoscopy in October 2016 revealed a 5 by 5 mm tumor on the left side of the prostate proximal to the left ureteral orifice. (described as a superficial non-invasive bladder tumor with no chemo or radiation indicated at this point)  ---The tumor found in Oct 2016 was resected Oct 17, 2015 with intravesical mitomycin given   3. Anemia ---transfused one unit thus far  ---due to acute blood loss from hematuria last couple of weeks  ---he also has chronic B12 deficiency and also MGUS (which can contribute to a chronic anemia)  4. Acute on chronic renal fallure ---  Cr was up to 1.99 at adm ---Baseline Cr is about 1.7 (stage 3) ---Cr went up to 3.24 on 01/09.  5. Bilateral hydronephrosis --- Per [place] report: This may be secondary to mid to lower ureteral stones or obstruction at the level of the ureterovesical junctions. The urinary bladder could not be adequately assessed. Cystoscopy is recommended.There is a 1 cm diameter midpole stone on  the left. There are tiny cysts in both kidneys   6. Pulmonary Edema  7. Small bilateral pleural effusions   Other diagnoses:  Fatigue GERD HTN CAD --with h/o CABG Sept 2013 Cardiac Dysrhythmias  ---s/p Pacemaker Sept 2013 H/O Aortic Valve replacement with porcine valve Sept 2013.  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 Rudean Hitt in cancer clinic for this in Dec 2016  ---to be monitored yearly for this Poor oral intake  ------------------------------------------------------------------------------------------------------------------------- SYMPTOMS: Pelvic pain, bladder spasms (treated with B and O suppositories), frequency (when no foley in place), rectal pain from proctitis, and hypoxia  -------------------------------------------------------------------------------------------------------------------------    REVIEW OF SYSTEMS:  Pt complains of having untreated pain. Would like more pain meds more often.  Son, Austin Contreras, says that fast rate of CBI causes increased pain (like now) and slower rates reduce his pain.    CODE STATUS: Full Code --BUT son, Austin Contreras, says pt is supposed to be DNR.  Will talk further with pts wife who is HCPOA --unless pt is able to discuss this.  Right now, pt is again sleeping and not to be disturbed.    PAST MEDICAL HISTORY: Past Medical History  Diagnosis Date  . B12  deficiency anemia 08/02/2015  . Bladder cancer ([place])     stage 2 s/p TUBTR  . GERD (gastroesophageal reflux disease)   . Hypertension   . PONV (postoperative nausea and vomiting)   . Hyperlipidemia   . Coronary artery disease   . H/O aortic valve replacement with porcine valve   . BPH (benign prostatic hyperplasia)   . A-fib ([place])   . SVT (supraventricular tachycardia) ([place])   . CKD (chronic kidney disease) stage 3, GFR 30-59 ml/min     PAST SURGICAL HISTORY:  Past Surgical History  Procedure Laterality Date  . Appendectomy    . Coronary artery bypass graft    . Cataracts    . Cataract extraction, bilateral    . Transurethral resection of bladder tumor N/A 10/17/2015    Procedure: TRANSURETHRAL RESECTION OF BLADDER TUMOR (TURBT);  Surgeon: Royston Cowper, MD;  Location: [facility] ORS;  Service: Urology;  Laterality: N/A;  . Aortic valve replacement    . Pacemaker insertion    . Hand surgery      after trauma  . Rotator cuff repair      right shoulder  . Prepatellar bursa excision    . Cystoscopy with fulgeration Bilateral 12/23/2015    Procedure: CYSTOSCOPY WITH FULGERATION;  Surgeon: Royston Cowper, MD;  Location: [facility] ORS;  Service: Urology;  Laterality: Bilateral;  Vital Signs: BP 93/61 mmHg  Pulse 99  Temp(Src) 98.5 F (36.9 C) (Axillary)  Resp 20  Ht 5\' 10"  (1.778 m)  Wt 77.565 kg (171 lb)  BMI 24.54 kg/m2  SpO2 98% Filed Weights   12/22/15 1048  Weight: 77.565 kg (171 lb)    Estimated body mass index is 24.54 kg/(m^2) as calculated from the following:   Height as of this encounter: 5\' 10"  (1.778 m).   Weight as of this encounter: 77.565 kg (171 lb).  PHYSICAL EXAM:      LABS: CBC:    Component Value Date/Time   WBC 11.2* 12/27/2015 0610   WBC 4.6 04/05/2015 1041   HGB 9.2* 12/27/2015 0610   HGB 12.2* 04/05/2015 1041   HCT 28.3* 12/27/2015 0610   HCT 37.4* 04/05/2015 1041   PLT 257 12/27/2015 0610   PLT 162 04/05/2015 1041   MCV 92.5 12/27/2015  0610   MCV 93 04/05/2015 1041   NEUTROABS 4.8 12/06/2015 1016   NEUTROABS 2.7 04/05/2015 1041   LYMPHSABS 0.8* 12/06/2015 1016   LYMPHSABS 1.0 04/05/2015 1041   MONOABS 0.8 12/06/2015 1016   MONOABS 0.6 04/05/2015 1041   EOSABS 0.2 12/06/2015 1016   EOSABS 0.2 04/05/2015 1041   BASOSABS 0.0 12/06/2015 1016   BASOSABS 0.0 04/05/2015 1041   Comprehensive Metabolic Panel:    Component Value Date/Time   NA 138 12/27/2015 0610   NA 136 09/14/2014 1046   K 5.6* 12/27/2015 0610   K 5.0 09/14/2014 1046   CL 109 12/27/2015 0610   CL 102 09/14/2014 1046   CO2 18* 12/27/2015 0610   CO2 25 09/14/2014 1046   BUN 65* 12/27/2015 0610   BUN 30* 09/14/2014 1046   CREATININE 4.29* 12/27/2015 0610   CREATININE 1.82* 04/05/2015 1041   GLUCOSE 98 12/27/2015 0610   GLUCOSE 96 09/14/2014 1046   CALCIUM 8.5* 12/27/2015 0610   CALCIUM 8.9 09/14/2014 1046   AST 19 12/27/2015 0610   AST 15 08/01/2014 1308   ALT 32 12/27/2015 0610   ALT 18 08/01/2014 1308   ALKPHOS 81 12/27/2015 0610   ALKPHOS 59 08/01/2014 1308   BILITOT 2.5* 12/27/2015 0610   BILITOT 0.4 08/01/2014 1308   PROT 6.6 12/27/2015 0610   PROT 7.4 08/01/2014 1308   ALBUMIN 2.9* 12/27/2015 0610   ALBUMIN 3.7 08/01/2014 1308     More than 50% of the visit was spent in counseling/coordination of care: YES  Time Spent: 45 min

## 2015-12-27 NOTE — Progress Notes (Signed)
[facility] NAME: Austin Contreras    MR#:  [id number]  DATE OF BIRTH:  August 24, 1926  SUBJECTIVE:    Family and patient has decided on nephrostomy tubes. Patient still in pain.  REVIEW OF SYSTEMS:    Review of Systems  Constitutional: Negative for fever, chills and malaise/fatigue.  HENT: Negative for sore throat.   Eyes: Negative for blurred vision.  Respiratory: Negative for cough, hemoptysis, shortness of breath and wheezing.   Cardiovascular: Negative for chest pain, palpitations and leg swelling.  Gastrointestinal: Negative for nausea, vomiting, abdominal pain, diarrhea and blood in stool.  Genitourinary: Positive for hematuria. Negative for dysuria.  Musculoskeletal: Negative for back pain.  Neurological: Negative for dizziness, tremors and headaches.  Endo/Heme/Allergies: Does not bruise/bleed easily.    Tolerating Diet: Yes      DRUG ALLERGIES:   Allergies  Allergen Reactions  . Penicillins Rash and Other (See Comments)    Has patient had a PCN reaction causing immediate rash, facial/tongue/throat swelling, SOB or lightheadedness with hypotension: No Has patient had a PCN reaction causing severe rash involving mucus membranes or skin necrosis: No Has patient had a PCN reaction that required hospitalization No Has patient had a PCN reaction occurring within the last 10 years: No If all of the above answers are "NO", then may proceed with Cephalosporin use.    VITALS:  Blood pressure 112/66, pulse 73, temperature 97.6 F (36.4 C), temperature source Oral, resp. rate 16, height 5\' 10"  (1.778 m), weight 77.565 kg (171 lb), SpO2 94 %.  PHYSICAL EXAMINATION:   Physical Exam  Constitutional: He is oriented to person, place, and time. No distress.  HENT:  Head: Normocephalic.  Eyes: No scleral icterus.  Neck: Normal range of motion. Neck supple. No JVD present. No tracheal deviation present.  Cardiovascular:  Normal rate, regular rhythm and normal heart sounds.  Exam reveals no gallop and no friction rub.   No murmur heard. Pulmonary/Chest: Effort normal and breath sounds normal. No respiratory distress. He has no wheezes. He has no rales. He exhibits no tenderness.  Abdominal: Soft. Bowel sounds are normal. He exhibits no distension and no mass. There is no tenderness. There is no rebound and no guarding.  Musculoskeletal: Normal range of motion. He exhibits no edema.  Neurological: He is alert and oriented to person, place, and time.  Skin: Skin is warm. No rash noted. No erythema.  Psychiatric: Affect and judgment normal.      LABORATORY PANEL:   CBC  Recent Labs Lab 12/27/15 0610  WBC 11.2*  HGB 9.2*  HCT 28.3*  PLT 257   ------------------------------------------------------------------------------------------------------------------  Chemistries   Recent Labs Lab 12/22/15 1055  12/26/15 0416  NA 138  < > 136  K 4.3  < > 4.8  CL 106  < > 107  CO2 23  < > 21*  GLUCOSE 99  < > 100*  BUN 41*  < > 53*  CREATININE 1.99*  < > 3.71*  CALCIUM 8.4*  < > 8.2*  AST 40  --   --   ALT 124*  --   --   ALKPHOS 104  --   --   BILITOT 1.7*  --   --   < > = values in this interval not displayed. ------------------------------------------------------------------------------------------------------------------  Cardiac Enzymes No results for input(s): TROPONINI in the last 168 hours. ------------------------------------------------------------------------------------------------------------------  RADIOLOGY:  Dg Chest 2 View  12/25/2015  CLINICAL DATA:  History  of bladder malignancy, now with hematuria; hypoxia, hypo name training EM, gradually progressive anemia and fatigue over the past several weeks EXAM: CHEST  2 VIEW COMPARISON:  Portable chest x-ray of December 24, 2015 FINDINGS: The lungs are well-expanded. The density in the right perihilar and infrahilar region is less  conspicuous on this study today. There is hazy density that projects in the periphery of the left mid lung which is felt to reflect overlap with the scapula. The cardiac silhouette is top-normal in size. The central pulmonary vascularity remains mildly engorged with slightly decreased cephalization. There is no large pleural effusion but a small amount of pleural fluid layering posteriorly, bilaterally is suspected. The permanent pacemaker is in reasonable position. There are 7 intact sternal wires from previous CABG. IMPRESSION: Decreased pulmonary interstitial edema secondary to CHF. Small bilateral pleural effusions layering posteriorly. There is no alveolar pneumonia. Electronically Signed   By: David  Martinique M.D.   On: 12/25/2015 13:31   US Renal  12/26/2015  CLINICAL DATA:  Three weeks of gross hematuria and hydronephrosis. History of bladder malignancy and kidney stones. EXAM: RENAL / URINARY TRACT ULTRASOUND COMPLETE COMPARISON:  Abdominal ultrasound of April 09, 2013 and CT scan of the abdomen pelvis of March 10 2014. FINDINGS: Right Kidney: Length: 11.3 cm. There is moderate hydronephrosis and proximal hydroureter. In the midpole there is a hypoechoic anechoic structure measuring 9 mm in diameter most compatible with a cyst. Left Kidney: Length: 11.8 cm. There is moderate hydronephrosis. There is a mid pole shadowing calcification measuring 1 cm in diameter. There is an exophytic mid upper pole cortical structure measuring 1.1 x 0.8 x 1.2 cm. Bladder: Decompressed by Foley catheter. IMPRESSION: 1. Bilateral hydronephrosis. This may be secondary to mid to lower ureteral stones or obstruction at the level of the ureterovesical junctions. The urinary bladder could not be adequately assessed. Cystoscopy is recommended. 2. There is a 1 cm diameter midpole stone on the left. There are tiny cysts in both kidneys. Electronically Signed   By: David  Martinique M.D.   On: 12/26/2015 11:39     ASSESSMENT AND PLAN:    82 year old male with a history of radiation proctitis and radiation cystitis with muscle invasive bladder cancer who presents with hematuria.  1. Hematuria: Patient has a large-bore 3-way Foley catheter with continuous bladder irrigation which will be continued for now as he has a little bit of blood in urine. I have spoken with Dr Rogers Blocker about patient. Patient has three options 1) continue with FOLEY 2) bilateral nephrostomy tubes or Palliative care. He essentially has end stage radiation cystitis according to Dr Rogers Blocker.  Family and patient have decided on nephrostomy tubes. Plan is for nephrostomy tubes this afternoon by interventional radiology. Patient will need follow-up with nephrology and a change in his nephrostomy tubes every week.   2. Acute blood loss anemia: This is from hematuria. Patient is status post 1 unit PRBCs. Hemoglobin remained stable. Patient does follow up at the cancer center for B12 injections.  3. Atrial fibrillation: Continue sotalol. Patient is not on anticoagulation due to history of GI bleed and hematuria.  4. Essential hypertension: Blood pressure is acceptable.   5. Acute renal failure: Renal ultrasound shows bilateral hydronephrosis. Plan for nephrostomy tubes by interventional radiologist this afternoon. This should help with creatinine. Appreciate nephrology consultation.  6. Bladder spasm: Continue B and O suppositories.  7. Hypoxia: Chest x-ray shows resolving interstitial edema with small bilateral pleural effusions.  Management plans discussed with the  patient and he is in agreement.  CODE STATUS: Full.  TOTAL TIME TAKING CARE OF THIS PATIENT: 22 minutes.   Physical therapy recommends home health PT  Patient has very poor overall prognosis.  Palliative care care consult has been placed. Plan of care discussed with wife. Luise Yamamoto M.D on 12/27/2015 at 10:15 AM  Between 7am to 6pm - Pager - [phone] After 6pm go to [email or web address] -  password [email or web address] [facility]  Office  [phone]  CC: Primary care physician; [name], MD  Note: This dictation was prepared with Dragon dictation along with smaller phrase technology. Any transcriptional errors that result from this process are unintentional.

## 2015-12-27 NOTE — Progress Notes (Signed)
Unable to insert bilateral nephrostomy tubes today d/t unavailability of VIR.  Will reschedule for tomorrow 12/28/15 per Dr. Earleen Newport.

## 2015-12-27 NOTE — Progress Notes (Signed)
PT Cancellation Note  Patient Details Name: Austin Contreras MRN: [id number] DOB: 08-12-1926   Cancelled Treatment:    Reason Eval/Treat Not Completed: Other (comment). Treatment attempted, however pt writhing in pain and is about to go to Special Procedures for nephrostomy tube placement. Pt currently not able to participate in therapy. Depending on anesthesia, new order may be needed for continued therapy. Will continue to follow.   Celinda Dethlefs 12/27/2015, 1:54 PM  Greggory Stallion, PT, DPT [phone]

## 2015-12-27 NOTE — Progress Notes (Signed)
Subjective:  Patient is known to our practice from outpatient. He is usually followed by Dr. Holley Raring. He was last seen in October 2016  He was admitted on January 6 for hematuria He was seen by urologist and placed on continuous bladder irrigation Hospital course is also complicated by acute renal failure with creatinine rising to 3.71, which was thought to be secondary to bilateral hydronephrosis as seen on kidney ultrasound Admission creatinine was 1.73/GFR of 34     Objective:  Vital signs in last 24 hours:  Temp:  [97.6 F (36.4 C)-98.5 F (36.9 C)] 98.5 F (36.9 C) (01/11 1332) Pulse Rate:  [70-99] 99 (01/11 1332) Resp:  [16-20] 20 (01/11 1332) BP: (93-130)/(59-66) 93/61 mmHg (01/11 1332) SpO2:  [94 %-98 %] 98 % (01/11 1332)  Weight change:  Filed Weights   12/22/15 1048  Weight: 77.565 kg (171 lb)    Intake/Output:    Intake/Output Summary (Last 24 hours) at 12/27/15 1534 Last data filed at 12/27/15 1425  Gross per 24 hour  Intake  16087 ml  Output   7225 ml  Net   8862 ml     Physical Exam: General:  elderly gentleman, laying and had   HEENT  dry mucous membranes   Neck  supple   Pulm/lungs  normal respiratory effort   CVS/Heart  no rub or gallop   Abdomen:   soft, nontender   Extremities:  no peripheral edema   Neurologic:  alert, able to answer questions   Skin:  warm, dry   Access:     Foley with CBI     Basic Metabolic Panel:   Recent Labs Lab 12/22/15 1055 12/23/15 0150 12/24/15 1357 12/25/15 0618 12/26/15 0416  NA 138 139 133* 131* 136  K 4.3 4.0 4.8 4.3 4.8  CL 106 109 104 104 107  CO2 23 24 21* 19* 21*  GLUCOSE 99 103* 96 104* 100*  BUN 41* 37* 36* 47* 53*  CREATININE 1.99* 1.73* 2.30* 3.24* 3.71*  CALCIUM 8.4* 8.1* 8.1* 8.1* 8.2*     CBC:  Recent Labs Lab 12/22/15 1055  12/23/15 0150  12/23/15 1803 12/24/15 0508 12/25/15 0618 12/26/15 0416 12/27/15 0610  WBC 5.9  --  5.5  --   --  8.4  --  10.6 11.2*  HGB 9.5*  <  > 9.6*  < > 9.6* 9.5* 9.1* 8.8* 9.2*  HCT 28.6*  --  29.1*  --   --  28.4*  --  26.6* 28.3*  MCV 91.9  --  93.8  --   --  92.9  --  92.4 92.5  PLT 155  --  140*  --   --  147*  --  191 257  < > = values in this interval not displayed.    Microbiology:  No results found for this or any previous visit (from the past 720 hour(s)).  Coagulation Studies: No results for input(s): LABPROT, INR in the last 72 hours.  Urinalysis: No results for input(s): COLORURINE, LABSPEC, PHURINE, GLUCOSEU, HGBUR, BILIRUBINUR, KETONESUR, PROTEINUR, UROBILINOGEN, NITRITE, LEUKOCYTESUR in the last 72 hours.  Invalid input(s): APPERANCEUR    Imaging: US Renal  12/26/2015  CLINICAL DATA:  Three weeks of gross hematuria and hydronephrosis. History of bladder malignancy and kidney stones. EXAM: RENAL / URINARY TRACT ULTRASOUND COMPLETE COMPARISON:  Abdominal ultrasound of April 09, 2013 and CT scan of the abdomen pelvis of March 10 2014. FINDINGS: Right Kidney: Length: 11.3 cm. There is moderate hydronephrosis and proximal  hydroureter. In the midpole there is a hypoechoic anechoic structure measuring 9 mm in diameter most compatible with a cyst. Left Kidney: Length: 11.8 cm. There is moderate hydronephrosis. There is a mid pole shadowing calcification measuring 1 cm in diameter. There is an exophytic mid upper pole cortical structure measuring 1.1 x 0.8 x 1.2 cm. Bladder: Decompressed by Foley catheter. IMPRESSION: 1. Bilateral hydronephrosis. This may be secondary to mid to lower ureteral stones or obstruction at the level of the ureterovesical junctions. The urinary bladder could not be adequately assessed. Cystoscopy is recommended. 2. There is a 1 cm diameter midpole stone on the left. There are tiny cysts in both kidneys. Electronically Signed   By: David  Martinique M.D.   On: 12/26/2015 11:39     Medications:   . sodium chloride 50 mL/hr at 12/27/15 0400   . docusate sodium  100 mg Oral BID  . feeding  supplement (ENSURE ENLIVE)  237 mL Oral BID BM  . pantoprazole  40 mg Oral QAC breakfast  . polyethylene glycol  17 g Oral Daily  . simvastatin  10 mg Oral QHS  . sodium chloride  3 mL Intravenous Q12H  . sotalol  80 mg Oral BID   acetaminophen **OR** acetaminophen, hydrocortisone, morphine injection, ondansetron **OR** ondansetron (ZOFRAN) IV, opium-belladonna, oxyCODONE, polyethylene glycol, simethicone  Assessment/ Plan:  82 y.o. 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 Acute renal failure on chronic kidney disease stage III with b/l hydronephrosis Baseline creatinine 1.7/GFR of 34 at the time of admission Cause for acute renal failure is likely b/l hydronephrosis Case discussed in detail with patient, his daughter Maudie Mercury,   After detailed discussion, the consensus was to proceed with nephrostomy tube placement by vascular radiology as he only has limited options in this situation Nephrostomies by VIR  2. Hematuria secondary to radiation cystitis Patient is on continuous bladder irrigation at present Followed by Dr Yves Dill     LOS: [place] 1/11/20173:34 PM

## 2015-12-27 NOTE — Progress Notes (Signed)
Initial Nutrition Assessment     INTERVENTION:  Meals and snacks: Await diet progression post procedure Medical Nutrition Supplement Therapy: Recommend changing nepro to ensure BID for added nutrition when able to take po   NUTRITION DIAGNOSIS:   Inadequate oral intake related to acute illness as evidenced by NPO status.    GOAL:   Patient will meet greater than or equal to 90% of their needs    MONITOR:    (Energy intake, Electrolyte and renal profile)  REASON FOR ASSESSMENT:   LOS    ASSESSMENT:      Pt admitted with hematuria, ARF, bilateral hydronephrosis.  Planning right and left nephrostomy tube placement today.    Past Medical History  Diagnosis Date  . B12 deficiency anemia 08/02/2015  . Bladder cancer ([place])     stage 2 s/p TUBTR  . GERD (gastroesophageal reflux disease)   . Hypertension   . PONV (postoperative nausea and vomiting)   . Hyperlipidemia   . Coronary artery disease   . H/O aortic valve replacement with porcine valve   . BPH (benign prostatic hyperplasia)   . A-fib ([place])   . SVT (supraventricular tachycardia) ([place])   . CKD (chronic kidney disease) stage 3, GFR 30-59 ml/min     Current Nutrition: NPO at this time.  Family at bedside and reports poor po intake for the past 2 days, eating bites   Food/Nutrition-Related History: Family reports prior to admission intake good, in ED ate 100% of boxed meal   Scheduled Medications:  . docusate sodium  100 mg Oral BID  . feeding supplement (NEPRO CARB STEADY)  237 mL Oral BID BM  . pantoprazole  40 mg Oral QAC breakfast  . polyethylene glycol  17 g Oral Daily  . simvastatin  10 mg Oral QHS  . sodium chloride  3 mL Intravenous Q12H  . sotalol  80 mg Oral BID    Continuous Medications:  . sodium chloride 50 mL/hr at 12/27/15 0400     Electrolyte/Renal Profile and Glucose Profile:   Recent Labs Lab 12/24/15 1357 12/25/15 0618 12/26/15 0416  NA 133* 131* 136  K 4.8 4.3 4.8  CL  104 104 107  CO2 21* 19* 21*  BUN 36* 47* 53*  CREATININE 2.30* 3.24* 3.71*  CALCIUM 8.1* 8.1* 8.2*  GLUCOSE 96 104* 100*   Protein Profile:   Recent Labs Lab 12/22/15 1055  ALBUMIN 3.7    Gastrointestinal Profile: Last BM: 01/9   Nutrition-Focused Physical Exam Findings:  Unable to complete Nutrition-Focused physical exam at this time.     Weight Change: noted wt gain prior to admission    Diet Order:  Diet NPO time specified Except for: Sips with Meds  Skin:   reviewed   Height:   Ht Readings from Last 1 Encounters:  12/22/15 5\' 10"  (1.778 m)    Weight:   Wt Readings from Last 1 Encounters:  12/22/15 171 lb (77.565 kg)    Ideal Body Weight:     BMI:  Body mass index is 24.54 kg/(m^2).  Estimated Nutritional Needs:   Kcal:  BEE 1451 kcals (IF 1.0-1.2, AF 1.3) [id number] kcals/d  Protein:  (1.0-1.2 g/kg) 77-92 g/d  Fluid:  (25-73ml/kg) 1925-2341ml/d  EDUCATION NEEDS:   No education needs identified at this time  [place]. Zenia Resides, [place], [place] (pager) Weekend/On-Call pager [phone])

## 2015-12-27 NOTE — Care Management (Signed)
Patient admitted form home with Hematuria- with underlying bladder cancer history.  Patient lives at home with his wife.  Has 2 adult daughters and a son who live locally .  Uses [facility] on [address].  Patient states that he has a walker and cane in the home, but does not have a need for them.  Patient is requiring acute O2 at this time.  If needed will need to be assessed prior to discharge.  Up until this admission patient was active, performed all ADL's and drove.  PT currently recommending Home health PT.  [facility] has selected [facility].  Heads up referral made to [facility] with [facility].  Wife and children will transport patient to follow up after discharge.  RNCM following

## 2015-12-27 NOTE — Care Management Important Message (Signed)
Important Message  Patient Details  Name: Austin Contreras MRN: [id number] Date of Birth: January 17, 1926   Medicare Important Message Given:  Yes    [name] 12/27/2015, 11:42 AM

## 2015-12-28 ENCOUNTER — Inpatient Hospital Stay: Payer: Medicare Other

## 2015-12-28 LAB — CBC
HCT: 24.9 % — ABNORMAL LOW (ref 40.0–52.0)
HEMOGLOBIN: 8.1 g/dL — AB (ref 13.0–18.0)
MCH: 30.4 pg (ref 26.0–34.0)
MCHC: 32.7 g/dL (ref 32.0–36.0)
MCV: 93.1 fL (ref 80.0–100.0)
Platelets: 241 10*3/uL (ref 150–440)
RBC: 2.68 MIL/uL — ABNORMAL LOW (ref 4.40–5.90)
RDW: 16.3 % — ABNORMAL HIGH (ref 11.5–14.5)
WBC: 6.3 10*3/uL (ref 3.8–10.6)

## 2015-12-28 LAB — BASIC METABOLIC PANEL
ANION GAP: 6 (ref 5–15)
BUN: 67 mg/dL — ABNORMAL HIGH (ref 6–20)
CALCIUM: 8.1 mg/dL — AB (ref 8.9–10.3)
CO2: 20 mmol/L — AB (ref 22–32)
Chloride: 115 mmol/L — ABNORMAL HIGH (ref 101–111)
Creatinine, Ser: 4.29 mg/dL — ABNORMAL HIGH (ref 0.61–1.24)
GFR, EST AFRICAN AMERICAN: 13 mL/min — AB (ref >60)
GFR, EST NON AFRICAN AMERICAN: 11 mL/min — AB (ref >60)
GLUCOSE: 94 mg/dL (ref 65–99)
POTASSIUM: 4.6 mmol/L (ref 3.5–5.1)
Sodium: 141 mmol/L (ref 135–145)

## 2015-12-28 MED ORDER — MIDAZOLAM HCL 5 MG/5ML IJ SOLN
INTRAMUSCULAR | Status: AC | PRN
  Administered 2015-12-28: 2 mg via INTRAVENOUS
  Administered 2015-12-28: 1 mg via INTRAVENOUS

## 2015-12-28 MED ORDER — IOHEXOL 300 MG/ML  SOLN
50.0000 mL | Freq: Once | INTRAMUSCULAR | Status: AC | PRN
Start: 2015-12-28 — End: 2015-12-28
  Administered 2015-12-28: 10 mL

## 2015-12-28 MED ORDER — FENTANYL CITRATE (PF) 100 MCG/2ML IJ SOLN
INTRAMUSCULAR | Status: AC
  Filled 2015-12-28: qty 4

## 2015-12-28 MED ORDER — FENTANYL CITRATE (PF) 100 MCG/2ML IJ SOLN
INTRAMUSCULAR | Status: AC | PRN
  Administered 2015-12-28: 25 ug via INTRAVENOUS
  Administered 2015-12-28: 50 ug via INTRAVENOUS

## 2015-12-28 MED ORDER — CIPROFLOXACIN IN D5W 400 MG/200ML IV SOLN
400.0000 mg | Freq: Once | INTRAVENOUS | Status: AC
  Administered 2015-12-28: 400 mg via INTRAVENOUS
  Filled 2015-12-28: qty 200

## 2015-12-28 MED ORDER — MIDAZOLAM HCL 5 MG/5ML IJ SOLN
INTRAMUSCULAR | Status: AC
  Filled 2015-12-28: qty 10

## 2015-12-28 MED ORDER — CIPROFLOXACIN IN D5W 400 MG/200ML IV SOLN
INTRAVENOUS | Status: AC
  Filled 2015-12-28: qty 200

## 2015-12-28 MED ORDER — LIDOCAINE HCL (PF) 1 % IJ SOLN
INTRAMUSCULAR | Status: AC
  Filled 2015-12-28: qty 30

## 2015-12-28 MED ORDER — LIDOCAINE HCL (PF) 1 % IJ SOLN
INTRAMUSCULAR | Status: DC | PRN
  Administered 2015-12-28: 10 mL via INTRADERMAL

## 2015-12-28 NOTE — Procedures (Signed)
Interventional Radiology Procedure Note  Procedure: Bilateral Nephrostomy Tube Placement  Complications:  None  Estimated Blood Loss: < 25 mL  Bilateral 10 Fr PCN's placed and formed in renal pelvis.  Connected to gravity drainage bags.  Good return of urine bilaterally.  Venetia Night. Kathlene Cote, M.D Pager:  [phone]

## 2015-12-28 NOTE — Progress Notes (Signed)
Physical Therapy Treatment Patient Details Name: Austin Contreras MRN: [id number] DOB: 20-Mar-1926 Today's Date: 12/28/2015    History of Present Illness Austin Contreras is a 82 y.o. 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 Patient is s/p cystoscopy with fulguration, clot evacuation and catheter placement;     PT Comments    Pt is making gradual progress towards goals, however is very limited by pain and feeling like he is being "tied" down in the bed with all the lines/leads. Per discussion with RN, nephrostomy tubes unable to be placed yesterday, hoping for placement today. After placement, foley will be removed and family and pt are confident that mobility will improve because pain will be improved. Pt very motivated to perform therapy. Unable to ambulate further in room this date secondary to pain from gravity pulling on foley. Pt is on borderline of needing SNF if mobility decreases following procedure. Will continue to follow. Has very supportive family at home.  Follow Up Recommendations  Home health PT     Equipment Recommendations   (pt has rw at home)    Recommendations for Other Services       Precautions / Restrictions Precautions Precautions: Fall Restrictions Weight Bearing Restrictions: No    Mobility  Bed Mobility Overal bed mobility: Needs Assistance Bed Mobility: Supine to Sit     Supine to sit: Supervision     General bed mobility comments: safe technique performed for bed mobility. Improved ability to follow commands and sit at EOB with supervision  Transfers Overall transfer level: Needs assistance Equipment used: Rolling walker (2 wheeled) Transfers: Sit to/from Stand Sit to Stand: Min guard         General transfer comment: transfers performed with rw and cga. Pt with safe technique and  no LOB. Pt with increased pain during standing, however pt motivated to continue mobility attempts  Ambulation/Gait Ambulation/Gait assistance: Min guard Ambulation Distance (Feet): 20 Feet Assistive device: Rolling walker (2 wheeled) Gait Pattern/deviations: Step-to pattern     General Gait Details: Pt ambulates with rw and small step length. Pt ambulated 4x5' in room. Does not want to ambulate further at this time. No LOB noted with retro ambulation.   Stairs            Wheelchair Mobility    Modified Rankin (Stroke Patients Only)       Balance                                    Cognition Arousal/Alertness: Lethargic Behavior During Therapy: WFL for tasks assessed/performed Overall Cognitive Status: Within Functional Limits for tasks assessed                      Exercises Other Exercises Other Exercises: Pt performed supine ther-ex including B LE SLRs, heel slides, and hip ab/ad. All ther-ex performed x 12 reps with supervision. No increased pain with exercises    General Comments        Pertinent Vitals/Pain Pain Assessment: Faces Faces Pain Scale: Hurts whole lot Pain Location: foley/rectal pain Pain Descriptors / Indicators: Burning Pain Intervention(s): Limited activity within patient's tolerance    Home Living  Prior Function            PT Goals (current goals can now be found in the care plan section) Acute Rehab PT Goals Patient Stated Goal: "to be able to go home and get back to PLOF" PT Goal Formulation: With patient Time For Goal Achievement: 01/07/16 Potential to Achieve Goals: Good Progress towards PT goals: Progressing toward goals    Frequency  Min 2X/week    PT Plan Current plan remains appropriate    Co-evaluation             End of Session Equipment Utilized During Treatment: Gait belt Activity Tolerance: Patient limited by pain Patient left: in bed;with bed alarm  set     Time: [id number] PT Time Calculation (min) (ACUTE ONLY): 25 min  Charges:  $Gait Training: 8-22 mins $Therapeutic Exercise: 8-22 mins                    G Codes:      [place] 01/22/2016, 12:59 PM  Greggory Stallion, PT, DPT [phone]

## 2015-12-28 NOTE — Progress Notes (Signed)
Subjective:  Patient is known to our practice from outpatient. He is usually followed by Dr. Holley Raring. He was last seen in October 2016  He was admitted on January 6 for hematuria He was seen by urologist and placed on continuous bladder irrigation Hospital course is also complicated by acute renal failure with creatinine rising to 4.29, which was thought to be secondary to bilateral hydronephrosis as seen on kidney ultrasound Admission creatinine was 1.73/GFR of 34     Objective:  Vital signs in last 24 hours:  Temp:  [97.5 F (36.4 C)-98.6 F (37 C)] 97.8 F (36.6 C) (01/12 1431) Pulse Rate:  [69-90] 90 (01/12 1515) Resp:  [16-28] 28 (01/12 1513) BP: (107-160)/(58-93) 160/74 mmHg (01/12 1513) SpO2:  [96 %-100 %] 97 % (01/12 1431)  Weight change:  Filed Weights   12/22/15 1048  Weight: 77.565 kg (171 lb)    Intake/Output:    Intake/Output Summary (Last 24 hours) at 12/28/15 1515 Last data filed at 12/28/15 1335  Gross per 24 hour  Intake 6952.33 ml  Output   5600 ml  Net 1352.33 ml     Physical Exam: General:  elderly gentleman, laying and had   HEENT  dry mucous membranes   Neck  supple   Pulm/lungs  normal respiratory effort   CVS/Heart  no rub or gallop   Abdomen:   soft, nontender   Extremities:  no peripheral edema   Neurologic:  alert, able to answer questions   Skin:  warm, dry   Access:     Foley with CBI     Basic Metabolic Panel:   Recent Labs Lab 12/24/15 1357 12/25/15 0618 12/26/15 0416 12/27/15 0610 12/28/15 0517  NA 133* 131* 136 138 141  K 4.8 4.3 4.8 5.6* 4.6  CL 104 104 107 109 115*  CO2 21* 19* 21* 18* 20*  GLUCOSE 96 104* 100* 98 94  BUN 36* 47* 53* 65* 67*  CREATININE 2.30* 3.24* 3.71* 4.29* 4.29*  CALCIUM 8.1* 8.1* 8.2* 8.5* 8.1*     CBC:  Recent Labs Lab 12/23/15 0150  12/24/15 0508 12/25/15 0618 12/26/15 0416 12/27/15 0610 12/28/15 0517  WBC 5.5  --  8.4  --  10.6 11.2* 6.3  HGB 9.6*  < > 9.5* 9.1* 8.8*  9.2* 8.1*  HCT 29.1*  --  28.4*  --  26.6* 28.3* 24.9*  MCV 93.8  --  92.9  --  92.4 92.5 93.1  PLT 140*  --  147*  --  191 257 241  < > = values in this interval not displayed.    Microbiology:  No results found for this or any previous visit (from the past 720 hour(s)).  Coagulation Studies: No results for input(s): LABPROT, INR in the last 72 hours.  Urinalysis: No results for input(s): COLORURINE, LABSPEC, PHURINE, GLUCOSEU, HGBUR, BILIRUBINUR, KETONESUR, PROTEINUR, UROBILINOGEN, NITRITE, LEUKOCYTESUR in the last 72 hours.  Invalid input(s): APPERANCEUR    Imaging: No results found.   Medications:   . sodium chloride 50 mL/hr at 12/28/15 0055   . ciprofloxacin      . ciprofloxacin  400 mg Intravenous Once  . docusate sodium  100 mg Oral BID  . feeding supplement (ENSURE ENLIVE)  237 mL Oral BID BM  . fentaNYL      . midazolam      . pantoprazole  40 mg Oral QAC breakfast  . polyethylene glycol  17 g Oral Daily  . simvastatin  10 mg Oral QHS  .  sodium chloride  3 mL Intravenous Q12H  . sotalol  80 mg Oral BID   acetaminophen **OR** acetaminophen, hydrocortisone, iohexol, morphine injection, ondansetron **OR** ondansetron (ZOFRAN) IV, opium-belladonna, oxyCODONE, polyethylene glycol, simethicone  Assessment/ Plan:  82 y.o. 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 Acute renal failure on chronic kidney disease stage III with b/l hydronephrosis Baseline creatinine 1.7/GFR of 34 at the time of admission Cause for acute renal failure is likely b/l hydronephrosis Case discussed in detail with patient, his daughter Maudie Mercury,   After detailed discussion, the consensus was to proceed with nephrostomy tube placement by vascular radiology as he only has limited options in this situation Nephrostomies by VIR  2. Hematuria secondary  to radiation cystitis Patient is on continuous bladder irrigation at present Followed by Dr Yves Dill     LOS: [place] 1/12/20173:15 PM

## 2015-12-28 NOTE — Progress Notes (Signed)
Austin Contreras NAME: Austin Contreras    MR#:  [id number]  DATE OF BIRTH:  [birth year]  SUBJECTIVE:  Patient has decreased bladder pain. Plan for nephrostomy tube placement today. Family at bedside.  REVIEW OF SYSTEMS:    Review of Systems  Constitutional: Negative for fever, chills and malaise/fatigue.  HENT: Negative for sore throat.   Eyes: Negative for blurred vision.  Respiratory: Negative for cough, hemoptysis, shortness of breath and wheezing.   Cardiovascular: Negative for chest pain, palpitations and leg swelling.  Gastrointestinal: Negative for nausea, vomiting, abdominal pain, diarrhea and blood in stool.  Genitourinary: Positive for hematuria. Negative for dysuria.  Musculoskeletal: Negative for back pain.  Neurological: Negative for dizziness, tremors and headaches.  Endo/Heme/Allergies: Does not bruise/bleed easily.    Tolerating Diet: Yes      DRUG ALLERGIES:   Allergies  Allergen Reactions  . Penicillins Rash and Other (See Comments)    Has patient had a PCN reaction causing immediate rash, facial/tongue/throat swelling, SOB or lightheadedness with hypotension: No Has patient had a PCN reaction causing severe rash involving mucus membranes or skin necrosis: No Has patient had a PCN reaction that required hospitalization No Has patient had a PCN reaction occurring within the last 10 years: No If all of the above answers are "NO", then may proceed with Cephalosporin use.    VITALS:  Blood pressure 111/69, pulse 74, temperature 97.9 F (36.6 C), temperature source Oral, resp. rate 19, height 5\' 10"  (1.778 m), weight 77.565 kg (171 lb), SpO2 99 %.  PHYSICAL EXAMINATION:   Physical Exam  Constitutional: He is oriented to person, place, and time. No distress.  HENT:  Head: Normocephalic.  Eyes: No scleral icterus.  Neck: Normal range of motion. Neck supple. No JVD present. No tracheal deviation present.   Cardiovascular: Normal rate, regular rhythm and normal heart sounds.  Exam reveals no gallop and no friction rub.   No murmur heard. Pulmonary/Chest: Effort normal and breath sounds normal. No respiratory distress. He has no wheezes. He has no rales. He exhibits no tenderness.  Abdominal: Soft. Bowel sounds are normal. He exhibits no distension and no mass. There is no tenderness. There is no rebound and no guarding.  Musculoskeletal: Normal range of motion. He exhibits no edema.  Neurological: He is alert and oriented to person, place, and time.  Skin: Skin is warm. No rash noted. No erythema.  Psychiatric: Affect and judgment normal.      LABORATORY PANEL:   CBC  Recent Labs Lab 12/28/15 0517  WBC 6.3  HGB 8.1*  HCT 24.9*  PLT 241   ------------------------------------------------------------------------------------------------------------------  Chemistries   Recent Labs Lab 12/27/15 0610 12/28/15 0517  NA 138 141  K 5.6* 4.6  CL 109 115*  CO2 18* 20*  GLUCOSE 98 94  BUN 65* 67*  CREATININE 4.29* 4.29*  CALCIUM 8.5* 8.1*  AST 19  --   ALT 32  --   ALKPHOS 81  --   BILITOT 2.5*  --    ------------------------------------------------------------------------------------------------------------------  Cardiac Enzymes No results for input(s): TROPONINI in the last 168 hours. ------------------------------------------------------------------------------------------------------------------  RADIOLOGY:  No results found.   ASSESSMENT AND PLAN:   82 year old male with a history of radiation proctitis and radiation cystitis with muscle invasive bladder cancer who presents with hematuria.  1. Hematuria: Patient has a large-bore 3-way Foley catheter with continuous bladder irrigation which will be continued for now as he has a little bit  of blood in urine. I have spoken with Dr Rogers Blocker about patient. Patient has three options 1) continue with FOLEY 2) bilateral  nephrostomy tubes or Palliative care. He essentially has end stage radiation cystitis according to Dr Rogers Blocker.  Family and patient have decided on nephrostomy tubes. Plan is for nephrostomy tubes this afternoon by interventional radiology. Patient will need follow-up with nephrology and a change in his nephrostomy tubes every week. I spoke with you neurologist on call regarding CBI. He recommends possibly discontinuing CBI and 1-2 days after nephrostomy tubes have been placed to assure the patient is tolerating the nephrostomy tubes.  Patient will ultimately need to follow-up with Dr. Rogers Blocker.   2. Acute blood loss anemia: This is from hematuria. Patient is status post 1 unit PRBCs. Hemoglobin relatively stable. Patient does follow up at the cancer center for B12 injections.  3. Atrial fibrillation: Continue sotalol. Patient is not on anticoagulation due to history of GI bleed and hematuria.  4. Essential hypertension: Blood pressure is acceptable.   5. Acute renal failure: Renal ultrasound shows bilateral hydronephrosis. Plan for nephrostomy tubes by interventional radiologist this afternoon. This should help with creatinine. Appreciate nephrology consultation.  6. Bladder spasm: Continue B and O suppositories.  7. Hypoxia: Chest x-ray shows resolving interstitial edema with small bilateral pleural effusions. Hypoxia is resolved  Management plans discussed with the patient and he is in agreement.  CODE STATUS: Full.  TOTAL TIME TAKING CARE OF THIS PATIENT: 45 minutes.  Plan of care discussed with family and Dr. Candiss Norse.   [name] M.D on 12/28/2015 at 12:41 PM  Between 7am to 6pm - Pager - [phone] After 6pm go to [email or web address] - password [email or web address] [facility]  Office  [phone]  CC: Primary care physician; [name], MD  Note: This dictation was prepared with Dragon dictation along with smaller phrase technology. Any transcriptional errors that  result from this process are unintentional.

## 2015-12-29 LAB — BASIC METABOLIC PANEL
Anion gap: 10 (ref 5–15)
BUN: 50 mg/dL — AB (ref 6–20)
CHLORIDE: 110 mmol/L (ref 101–111)
CO2: 16 mmol/L — AB (ref 22–32)
Calcium: 7.8 mg/dL — ABNORMAL LOW (ref 8.9–10.3)
Creatinine, Ser: 2.78 mg/dL — ABNORMAL HIGH (ref 0.61–1.24)
GFR, EST AFRICAN AMERICAN: 22 mL/min — AB (ref >60)
GFR, EST NON AFRICAN AMERICAN: 19 mL/min — AB (ref >60)
Glucose, Bld: 86 mg/dL (ref 65–99)
Potassium: 4.4 mmol/L (ref 3.5–5.1)
SODIUM: 136 mmol/L (ref 135–145)

## 2015-12-29 LAB — CBC
HEMATOCRIT: 24.7 % — AB (ref 40.0–52.0)
HEMOGLOBIN: 8.1 g/dL — AB (ref 13.0–18.0)
MCH: 30.3 pg (ref 26.0–34.0)
MCHC: 32.9 g/dL (ref 32.0–36.0)
MCV: 91.9 fL (ref 80.0–100.0)
Platelets: 240 10*3/uL (ref 150–440)
RBC: 2.69 MIL/uL — ABNORMAL LOW (ref 4.40–5.90)
RDW: 16.5 % — AB (ref 11.5–14.5)
WBC: 4.6 10*3/uL (ref 3.8–10.6)

## 2015-12-29 MED ORDER — MORPHINE SULFATE (PF) 2 MG/ML IV SOLN
1.0000 mg | Freq: Once | INTRAVENOUS | Status: AC
Start: 2015-12-29 — End: 2015-12-29
  Administered 2015-12-29: 1 mg via INTRAVENOUS
  Filled 2015-12-29: qty 1

## 2015-12-29 MED ORDER — MORPHINE SULFATE (PF) 2 MG/ML IV SOLN: 1.0000 mg | Freq: Once | INTRAVENOUS | Status: DC

## 2015-12-29 NOTE — Progress Notes (Signed)
Dr Michiel Sites notified CBI clotted off, irrigared x 2 large clots removed , pt did not tol, orders given.

## 2015-12-29 NOTE — Progress Notes (Addendum)
[place] [facility] [place] NAME: Austin Contreras    MR#:  [id number]  DATE OF BIRTH:  August 10, 1926  SUBJECTIVE:  Patient has had bilateral nephrostomy tubes. Nephrostomy tubes are draining urine. Patient complaining some pain at the site of nephrostomy tubes. Last bowel movement was yesterday.  REVIEW OF SYSTEMS:    Review of Systems  Constitutional: Negative for fever, chills and malaise/fatigue.  HENT: Negative for sore throat.   Eyes: Negative for blurred vision.  Respiratory: Negative for cough, hemoptysis, shortness of breath and wheezing.   Cardiovascular: Negative for chest pain, palpitations and leg swelling.  Gastrointestinal: Negative for nausea, vomiting, abdominal pain, diarrhea and blood in stool.  Genitourinary: Positive for frequency and hematuria. Negative for dysuria and urgency.  Musculoskeletal: Positive for back pain.  Neurological: Negative for dizziness, tremors and headaches.  Endo/Heme/Allergies: Does not bruise/bleed easily.    Tolerating Diet: Yes      DRUG ALLERGIES:   Allergies  Allergen Reactions  . Penicillins Rash and Other (See Comments)    Has patient had a PCN reaction causing immediate rash, facial/tongue/throat swelling, SOB or lightheadedness with hypotension: No Has patient had a PCN reaction causing severe rash involving mucus membranes or skin necrosis: No Has patient had a PCN reaction that required hospitalization No Has patient had a PCN reaction occurring within the last 10 years: No If all of the above answers are "NO", then may proceed with Cephalosporin use.    VITALS:  Blood pressure 115/77, pulse 70, temperature 97.7 F (36.5 C), temperature source Oral, resp. rate 16, height 5\' 10"  (1.778 m), weight 77.565 kg (171 lb), SpO2 97 %.  PHYSICAL EXAMINATION:   Physical Exam  Constitutional: He is oriented to person, place, and time. No distress.  HENT:  Head: Normocephalic.  Eyes: No  scleral icterus.  Neck: Normal range of motion. Neck supple. No JVD present. No tracheal deviation present.  Cardiovascular: Normal rate, regular rhythm and normal heart sounds.  Exam reveals no gallop and no friction rub.   No murmur heard. Pulmonary/Chest: Effort normal and breath sounds normal. No respiratory distress. He has no wheezes. He has no rales. He exhibits no tenderness.  Abdominal: Soft. Bowel sounds are normal. He exhibits no distension and no mass. There is no tenderness. There is no rebound and no guarding.  Musculoskeletal: Normal range of motion. He exhibits no edema.  Neurological: He is alert and oriented to person, place, and time.  Skin: Skin is warm. No rash noted. No erythema.  Psychiatric: Affect and judgment normal.      LABORATORY PANEL:   CBC  Recent Labs Lab 12/29/15 0634  WBC 4.6  HGB 8.1*  HCT 24.7*  PLT 240   ------------------------------------------------------------------------------------------------------------------  Chemistries   Recent Labs Lab 12/27/15 0610  12/29/15 0634  NA 138  < > 136  K 5.6*  < > 4.4  CL 109  < > 110  CO2 18*  < > 16*  GLUCOSE 98  < > 86  BUN 65*  < > 50*  CREATININE 4.29*  < > 2.78*  CALCIUM 8.5*  < > 7.8*  AST 19  --   --   ALT 32  --   --   ALKPHOS 81  --   --   BILITOT 2.5*  --   --   < > = values in this interval not displayed. ------------------------------------------------------------------------------------------------------------------  Cardiac Enzymes No results for input(s): TROPONINI in the last 168  hours. ------------------------------------------------------------------------------------------------------------------  RADIOLOGY:  [place] Intraoperative  12/28/2015  CLINICAL DATA:  Ultrasound was provided for use by the ordering physician, and a technical charge was applied by the performing facility.  No radiologist interpretation/professional services rendered.   Ir Nephrostomy  Placement Left  12/28/2015  CLINICAL DATA:  Progressive renal failure with evidence of bilateral hydronephrosis. The patient requires percutaneous nephrostomy tube placement. EXAM: 1. ULTRASOUND GUIDANCE FOR PUNCTURE OF THE LEFT RENAL COLLECTING SYSTEM. 2. LEFT PERCUTANEOUS NEPHROSTOMY TUBE PLACEMENT. 3. ULTRASOUND GUIDANCE FOR PUNCTURE OF THE RIGHT RENAL COLLECTING SYSTEM. 4. RIGHT PERCUTANEOUS NEPHROSTOMY TUBE PLACEMENT. COMPARISON:  Renal ultrasound on 12/26/2015 ANESTHESIA/SEDATION: 3.0 mg IV Versed; 75 mcg IV Fentanyl. Total Moderate Sedation Time 30 minutes CONTRAST:  10 ml Omnipaque 300 MEDICATIONS: 400 mg IV Cipro. Ciprofloxacin was given within two hours of incision. FLUOROSCOPY TIME:  1 minutes and 30 seconds. PROCEDURE: The procedure, risks, benefits, and alternatives were explained to the patient. Questions regarding the procedure were encouraged and answered. The patient understands and consents to the procedure. Bilateral flank regions were prepped with chlorhexidine in a sterile fashion, and a sterile drape was applied covering the operative field. A sterile gown and sterile gloves were used for the procedure. Local anesthesia was provided with 1% Lidocaine. Ultrasound was used to localize the left kidney. Under direct ultrasound guidance, a 21 gauge needle was advanced into the renal collecting system. Ultrasound image documentation was performed. Aspiration of urine sample was performed followed by contrast injection. A transitional dilator was advanced over a guidewire. Percutaneous tract dilatation was then performed over the guidewire. A 10 French percutaneous nephrostomy tube was then advanced and formed in the collecting system. Catheter position was confirmed by fluoroscopy after contrast injection. The right kidney was localized by ultrasound. A 21 gauge needle was advanced into the collecting system under ultrasound guidance. Contrast was injected. The tract was dilated over a wire and a 10  French percutaneous nephrostomy tube advanced and formed in the collecting system. Catheter positioning was confirmed by fluoroscopy after contrast injection. Both catheters were connected to gravity drainage bags and secured at the skin with Prolene retention sutures and StatLock devices. COMPLICATIONS: None. FINDINGS: Ultrasound demonstrates moderate bilateral hydronephrosis. Bilateral nephrostomy tubes were successfully placed and formed in each renal pelvis. There is good return of urine from both catheters after placement. IMPRESSION: Bilateral percutaneous nephrostomy tube placement. Bilateral 10 French nephrostomy tubes were placed and formed at the level of each renal pelvis. Electronically Signed   By: Aletta Edouard M.D.   On: 12/28/2015 16:13   Ir Nephrostomy Placement Right  12/28/2015  CLINICAL DATA:  Progressive renal failure with evidence of bilateral hydronephrosis. The patient requires percutaneous nephrostomy tube placement. EXAM: 1. ULTRASOUND GUIDANCE FOR PUNCTURE OF THE LEFT RENAL COLLECTING SYSTEM. 2. LEFT PERCUTANEOUS NEPHROSTOMY TUBE PLACEMENT. 3. ULTRASOUND GUIDANCE FOR PUNCTURE OF THE RIGHT RENAL COLLECTING SYSTEM. 4. RIGHT PERCUTANEOUS NEPHROSTOMY TUBE PLACEMENT. COMPARISON:  Renal ultrasound on 12/26/2015 ANESTHESIA/SEDATION: 3.0 mg IV Versed; 75 mcg IV Fentanyl. Total Moderate Sedation Time 30 minutes CONTRAST:  10 ml Omnipaque 300 MEDICATIONS: 400 mg IV Cipro. Ciprofloxacin was given within two hours of incision. FLUOROSCOPY TIME:  1 minutes and 30 seconds. PROCEDURE: The procedure, risks, benefits, and alternatives were explained to the patient. Questions regarding the procedure were encouraged and answered. The patient understands and consents to the procedure. Bilateral flank regions were prepped with chlorhexidine in a sterile fashion, and a sterile drape was applied covering the operative field. A sterile gown and sterile  gloves were used for the procedure. Local anesthesia was  provided with 1% Lidocaine. Ultrasound was used to localize the left kidney. Under direct ultrasound guidance, a 21 gauge needle was advanced into the renal collecting system. Ultrasound image documentation was performed. Aspiration of urine sample was performed followed by contrast injection. A transitional dilator was advanced over a guidewire. Percutaneous tract dilatation was then performed over the guidewire. A 10 French percutaneous nephrostomy tube was then advanced and formed in the collecting system. Catheter position was confirmed by fluoroscopy after contrast injection. The right kidney was localized by ultrasound. A 21 gauge needle was advanced into the collecting system under ultrasound guidance. Contrast was injected. The tract was dilated over a wire and a 10 French percutaneous nephrostomy tube advanced and formed in the collecting system. Catheter positioning was confirmed by fluoroscopy after contrast injection. Both catheters were connected to gravity drainage bags and secured at the skin with Prolene retention sutures and StatLock devices. COMPLICATIONS: None. FINDINGS: Ultrasound demonstrates moderate bilateral hydronephrosis. Bilateral nephrostomy tubes were successfully placed and formed in each renal pelvis. There is good return of urine from both catheters after placement. IMPRESSION: Bilateral percutaneous nephrostomy tube placement. Bilateral 10 French nephrostomy tubes were placed and formed at the level of each renal pelvis. Electronically Signed   By: Aletta Edouard M.D.   On: 12/28/2015 16:13     ASSESSMENT AND PLAN:   82 year old male with a history of radiation proctitis and radiation cystitis with muscle invasive bladder cancer who presents with hematuria.  1. Hematuria: Patient has a large-bore 3-way Foley catheter with continuous bladder irrigation which will be continued for now as he has a little bit of blood in urine. I have spoken with Dr Rogers Blocker about patient. Patient  has three options 1) continue with FOLEY 2) bilateral nephrostomy tubes or Palliative care. He essentially has end stage radiation cystitis according to Dr Rogers Blocker.  Family and patient have decided on nephrostomy tubes. Patient had nephrostomy tubes placed on 12/28/2015 by IR.  Patient will need follow-up with nephrology and a change in his nephrostomy tubes every week. I spoke with on call urologist regarding CBI. He recommends possibly discontinuing CBI and 1-2 days after nephrostomy tubes have been placed to assure the patient is tolerating the nephrostomy tubes.  Patient will ultimately need to follow-up with Dr. Rogers Blocker.   2. Acute blood loss anemia: This is from hematuria. Patient is status post 1 unit PRBCs. Hemoglobin relatively stable. Patient does follow up at the cancer center for B12 injections.  3. Atrial fibrillation: Continue sotalol. Patient is not on anticoagulation due to history of GI bleed and hematuria.  4. Essential hypertension: Blood pressure is acceptable.   5. Acute renal failure: Renal ultrasound shows bilateral hydronephrosis. Creatinine has much improved with the placement of bilateral nephrostomy tubes..  6. Bladder spasm: Continue B and O suppositories.  7. Hypoxia: Chest x-ray shows resolving interstitial edema with small bilateral pleural effusions. Hypoxia hasresolved  Management plans discussed with the patient and he is in agreement.  CODE STATUS: Full.  TOTAL TIME TAKING CARE OF THIS PATIENT: 35 minutes.  Plan of care discussed with family at bedside.  Possible discharge tomorrow pending physical therapy reevaluation for disposition.  Bowie Delia M.D on 12/29/2015 at 9:52 AM  Between 7am to 6pm - Pager - [phone] After 6pm go to [email or web address] - password [email or web address] [facility]  Office  [phone]  CC: Primary care physician; [name], MD  Note: This dictation  was prepared with Dragon dictation along with smaller  phrase technology. Any transcriptional errors that result from this process are unintentional.

## 2015-12-29 NOTE — Progress Notes (Signed)
Dr Michiel Sites notified foley cath clotted off, irrigated  X 3 pt did not tol, large clots removed,cbi running well

## 2015-12-29 NOTE — Care Management Important Message (Signed)
Important Message  Patient Details  Name: Austin Contreras MRN: [id number] Date of Birth: 09-01-1926   Medicare Important Message Given:  Yes 2nd notice given   Beau Fanny, RN 12/29/2015, 8:49 AM

## 2015-12-29 NOTE — Progress Notes (Signed)
Physical Therapy Treatment Patient Details Name: Austin Contreras MRN: [id number] DOB: [birth year] Today's Date: 12/29/2015    History of Present Illness Marquess Borriello is a 82 y.o. 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 Patient is s/p cystoscopy with fulguration, clot evacuation and catheter placement.  Pt now s/p B nephrostomy tubes placement 12/28/15.    PT Comments    Pt initially appearing lethargic sitting in chair but woke up with activity.  Pt was min assist to stand and initially had some difficulty with gait mechanics with RW (although still CGA with chair follow) but after about 50 feet pt with increased cadence and step through pattern and then steady without loss of balance (and able to ambulate around nursing loop).  Extra time required during session for management of multiple lines/drains and also to answer pt's families multiple questions regarding PT POC, assist levels, management of lines/drains with functional mobility, and discharge recommendations (pt's family appearing with good understanding end of session of all of above).  Pt encouraged to ambulate with nursing assist over the weekend using RW.   Follow Up Recommendations  Home health PT;Supervision for mobility/OOB (pt's family reports pt will have 24/7 assist)     Equipment Recommendations   (Pt has RW at home)    Recommendations for Other Services       Precautions / Restrictions Precautions Precautions: Fall Precaution Comments: Multiple tubes/lines/foley Restrictions Weight Bearing Restrictions: No    Mobility  Bed Mobility               General bed mobility comments: Deferred d/t pt already up in chair upon PT arrival.  Transfers Overall transfer level: Needs assistance Equipment used: Rolling walker (2 wheeled) Transfers: Sit  to/from Stand Sit to Stand: Min assist         General transfer comment: Increased effort to stand; pt required vc's for hand and feet placement  Ambulation/Gait Ambulation/Gait assistance: Min guard (plus chair follow) Ambulation Distance (Feet): 190 Feet Assistive device: Rolling walker (2 wheeled)   Gait velocity: significantly decreased initially but increased with distance   General Gait Details: Pt initially with significant decreased B step length/foot clearance/heelstrike and with step to pattern; after about 50 feet pt with step through pattern and improved cadence; pt stood for about 1 minute after 70 feet with RW for support (for rest) and then finished rest of ambulation around nursing loop   Stairs            Wheelchair Mobility    Modified Rankin (Stroke Patients Only)       Balance Overall balance assessment: Needs assistance Sitting-balance support: Bilateral upper extremity supported;Feet supported Sitting balance-Leahy Scale: Good Sitting balance - Comments: patient tends to lean to left due to increased pain on bottom when sitting erect Postural control: Left lateral lean Standing balance support: Bilateral upper extremity supported (on RW) Standing balance-Leahy Scale: Good                      Cognition Arousal/Alertness:  (initially lethargic but woke up with activity) Behavior During Therapy: [facility] for tasks assessed/performed Overall Cognitive Status: Within Functional Limits for tasks assessed                      Exercises      General Comments  General comments (skin integrity, edema, etc.): bloody urine in catheter; B nephrostomy tubes not secured around pt's LE's (found below pt's knees with pt sitting in chair) so PT secured leg bags for functional mobility  Nursing cleared pt for participation in physical therapy.  Pt agreeable to PT session.      Pertinent Vitals/Pain Pain Assessment: 0-10 Pain Score: 8  Pain  Location: "prostate pain" Pain Descriptors / Indicators: Aching;Sore Pain Intervention(s): Limited activity within patient's tolerance;Monitored during session;Repositioned;Patient requesting pain meds-RN notified  Vitals stable and WFL throughout treatment session.    Home Living                      Prior Function            PT Goals (current goals can now be found in the care plan section) Acute Rehab PT Goals Patient Stated Goal: "to be able to go home and get back to PLOF" PT Goal Formulation: With patient Time For Goal Achievement: 01/07/16 Potential to Achieve Goals: Good Progress towards PT goals: Progressing toward goals    Frequency  Min 2X/week    PT Plan Current plan remains appropriate    Co-evaluation             End of Session Equipment Utilized During Treatment: Gait belt Activity Tolerance: Patient tolerated treatment well Patient left: in chair;with call bell/phone within reach;with chair alarm set;with family/visitor present     Time: [id number] PT Time Calculation (min) (ACUTE ONLY): 38 min  Charges:  $Gait Training: 8-22 mins $Therapeutic Exercise: 8-22 mins $Therapeutic Activity: 8-22 mins                    G CodesLeitha Bleak 2016-01-04, 5:18 PM Leitha Bleak, [place]

## 2015-12-29 NOTE — Progress Notes (Signed)
Subjective:  Patient is known to our practice from outpatient. He is usually followed by Dr. Holley Raring. He was last seen in October 2016  He was admitted on January 6 for hematuria He was seen by urologist and placed on continuous bladder irrigation Hospital course is also complicated by acute renal failure with creatinine rising to 4.29, which was thought to be secondary to bilateral hydronephrosis as seen on kidney ultrasound Admission creatinine was 1.73/GFR of 34 He underwent bilateral nephrostomy tubes by VIR yesterday 1/12 It has resulted in improvement of serum creatinine down to 2.78 No acute complaints except some pain over the bladder area    Objective:  Vital signs in last 24 hours:  Temp:  [97.5 F (36.4 C)-98 F (36.7 C)] 97.6 F (36.4 C) (01/13 1254) Pulse Rate:  [69-90] 70 (01/13 1254) Resp:  [9-28] 18 (01/13 1254) BP: (102-160)/(57-93) 129/68 mmHg (01/13 1254) SpO2:  [95 %-100 %] 95 % (01/13 1254)  Weight change:  Filed Weights   12/22/15 1048  Weight: 77.565 kg (171 lb)    Intake/Output:    Intake/Output Summary (Last 24 hours) at 12/29/15 1325 Last data filed at 12/29/15 1233  Gross per 24 hour  Intake 998.97 ml  Output   2850 ml  Net -1851.03 ml     Physical Exam: General:  elderly gentleman, laying and had   HEENT  dry mucous membranes   Neck  supple   Pulm/lungs  normal respiratory effort   CVS/Heart  no rub or gallop   Abdomen:   soft, nontender   Extremities:  no peripheral edema   Neurologic:  alert, able to answer questions   Skin:  warm, dry   Access:     Foley with CBI     Basic Metabolic Panel:   Recent Labs Lab 12/25/15 0618 12/26/15 0416 12/27/15 0610 12/28/15 0517 12/29/15 0634  NA 131* 136 138 141 136  K 4.3 4.8 5.6* 4.6 4.4  CL 104 107 109 115* 110  CO2 19* 21* 18* 20* 16*  GLUCOSE 104* 100* 98 94 86  BUN 47* 53* 65* 67* 50*  CREATININE 3.24* 3.71* 4.29* 4.29* 2.78*  CALCIUM 8.1* 8.2* 8.5* 8.1* 7.8*      CBC:  Recent Labs Lab 12/24/15 0508 12/25/15 0618 12/26/15 0416 12/27/15 0610 12/28/15 0517 12/29/15 0634  WBC 8.4  --  10.6 11.2* 6.3 4.6  HGB 9.5* 9.1* 8.8* 9.2* 8.1* 8.1*  HCT 28.4*  --  26.6* 28.3* 24.9* 24.7*  MCV 92.9  --  92.4 92.5 93.1 91.9  PLT 147*  --  191 257 241 240      Microbiology:  No results found for this or any previous visit (from the past 720 hour(s)).  Coagulation Studies: No results for input(s): LABPROT, INR in the last 72 hours.  Urinalysis: No results for input(s): COLORURINE, LABSPEC, PHURINE, GLUCOSEU, HGBUR, BILIRUBINUR, KETONESUR, PROTEINUR, UROBILINOGEN, NITRITE, LEUKOCYTESUR in the last 72 hours.  Invalid input(s): APPERANCEUR    Imaging: [place] Intraoperative  12/28/2015  CLINICAL DATA:  Ultrasound was provided for use by the ordering physician, and a technical charge was applied by the performing facility.  No radiologist interpretation/professional services rendered.   Ir Nephrostomy Placement Left  12/28/2015  CLINICAL DATA:  Progressive renal failure with evidence of bilateral hydronephrosis. The patient requires percutaneous nephrostomy tube placement. EXAM: 1. ULTRASOUND GUIDANCE FOR PUNCTURE OF THE LEFT RENAL COLLECTING SYSTEM. 2. LEFT PERCUTANEOUS NEPHROSTOMY TUBE PLACEMENT. 3. ULTRASOUND GUIDANCE FOR PUNCTURE OF THE RIGHT RENAL COLLECTING SYSTEM.  4. RIGHT PERCUTANEOUS NEPHROSTOMY TUBE PLACEMENT. COMPARISON:  Renal ultrasound on 12/26/2015 ANESTHESIA/SEDATION: 3.0 mg IV Versed; 75 mcg IV Fentanyl. Total Moderate Sedation Time 30 minutes CONTRAST:  10 ml Omnipaque 300 MEDICATIONS: 400 mg IV Cipro. Ciprofloxacin was given within two hours of incision. FLUOROSCOPY TIME:  1 minutes and 30 seconds. PROCEDURE: The procedure, risks, benefits, and alternatives were explained to the patient. Questions regarding the procedure were encouraged and answered. The patient understands and consents to the procedure. Bilateral flank regions were  prepped with chlorhexidine in a sterile fashion, and a sterile drape was applied covering the operative field. A sterile gown and sterile gloves were used for the procedure. Local anesthesia was provided with 1% Lidocaine. Ultrasound was used to localize the left kidney. Under direct ultrasound guidance, a 21 gauge needle was advanced into the renal collecting system. Ultrasound image documentation was performed. Aspiration of urine sample was performed followed by contrast injection. A transitional dilator was advanced over a guidewire. Percutaneous tract dilatation was then performed over the guidewire. A 10 French percutaneous nephrostomy tube was then advanced and formed in the collecting system. Catheter position was confirmed by fluoroscopy after contrast injection. The right kidney was localized by ultrasound. A 21 gauge needle was advanced into the collecting system under ultrasound guidance. Contrast was injected. The tract was dilated over a wire and a 10 French percutaneous nephrostomy tube advanced and formed in the collecting system. Catheter positioning was confirmed by fluoroscopy after contrast injection. Both catheters were connected to gravity drainage bags and secured at the skin with Prolene retention sutures and StatLock devices. COMPLICATIONS: None. FINDINGS: Ultrasound demonstrates moderate bilateral hydronephrosis. Bilateral nephrostomy tubes were successfully placed and formed in each renal pelvis. There is good return of urine from both catheters after placement. IMPRESSION: Bilateral percutaneous nephrostomy tube placement. Bilateral 10 French nephrostomy tubes were placed and formed at the level of each renal pelvis. Electronically Signed   By: Aletta Edouard M.D.   On: 12/28/2015 16:13   Ir Nephrostomy Placement Right  12/28/2015  CLINICAL DATA:  Progressive renal failure with evidence of bilateral hydronephrosis. The patient requires percutaneous nephrostomy tube placement. EXAM: 1.  ULTRASOUND GUIDANCE FOR PUNCTURE OF THE LEFT RENAL COLLECTING SYSTEM. 2. LEFT PERCUTANEOUS NEPHROSTOMY TUBE PLACEMENT. 3. ULTRASOUND GUIDANCE FOR PUNCTURE OF THE RIGHT RENAL COLLECTING SYSTEM. 4. RIGHT PERCUTANEOUS NEPHROSTOMY TUBE PLACEMENT. COMPARISON:  Renal ultrasound on 12/26/2015 ANESTHESIA/SEDATION: 3.0 mg IV Versed; 75 mcg IV Fentanyl. Total Moderate Sedation Time 30 minutes CONTRAST:  10 ml Omnipaque 300 MEDICATIONS: 400 mg IV Cipro. Ciprofloxacin was given within two hours of incision. FLUOROSCOPY TIME:  1 minutes and 30 seconds. PROCEDURE: The procedure, risks, benefits, and alternatives were explained to the patient. Questions regarding the procedure were encouraged and answered. The patient understands and consents to the procedure. Bilateral flank regions were prepped with chlorhexidine in a sterile fashion, and a sterile drape was applied covering the operative field. A sterile gown and sterile gloves were used for the procedure. Local anesthesia was provided with 1% Lidocaine. Ultrasound was used to localize the left kidney. Under direct ultrasound guidance, a 21 gauge needle was advanced into the renal collecting system. Ultrasound image documentation was performed. Aspiration of urine sample was performed followed by contrast injection. A transitional dilator was advanced over a guidewire. Percutaneous tract dilatation was then performed over the guidewire. A 10 French percutaneous nephrostomy tube was then advanced and formed in the collecting system. Catheter position was confirmed by fluoroscopy after contrast injection. The right  kidney was localized by ultrasound. A 21 gauge needle was advanced into the collecting system under ultrasound guidance. Contrast was injected. The tract was dilated over a wire and a 10 French percutaneous nephrostomy tube advanced and formed in the collecting system. Catheter positioning was confirmed by fluoroscopy after contrast injection. Both catheters were  connected to gravity drainage bags and secured at the skin with Prolene retention sutures and StatLock devices. COMPLICATIONS: None. FINDINGS: Ultrasound demonstrates moderate bilateral hydronephrosis. Bilateral nephrostomy tubes were successfully placed and formed in each renal pelvis. There is good return of urine from both catheters after placement. IMPRESSION: Bilateral percutaneous nephrostomy tube placement. Bilateral 10 French nephrostomy tubes were placed and formed at the level of each renal pelvis. Electronically Signed   By: Aletta Edouard M.D.   On: 12/28/2015 16:13     Medications:   . sodium chloride 50 mL/hr at 12/28/15 2314   . docusate sodium  100 mg Oral BID  . feeding supplement (ENSURE ENLIVE)  237 mL Oral BID BM  . pantoprazole  40 mg Oral QAC breakfast  . polyethylene glycol  17 g Oral Daily  . simvastatin  10 mg Oral QHS  . sodium chloride  3 mL Intravenous Q12H  . sotalol  80 mg Oral BID   acetaminophen **OR** acetaminophen, hydrocortisone, lidocaine (PF), morphine injection, ondansetron **OR** ondansetron (ZOFRAN) IV, opium-belladonna, oxyCODONE, simethicone  Assessment/ Plan:  82 y.o. 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 Acute renal failure on chronic kidney disease stage III with b/l hydronephrosis Baseline creatinine 1.7/GFR of 34 at the time of admission Cause for acute renal failure is b/l hydronephrosis - has serum creatinine has improved after nephrostomies were placed  2. Hematuria secondary to radiation cystitis Patient is on continuous bladder irrigation at present Followed by Dr Yves Dill     LOS: 7 Angeli Demilio 1/13/20171:25 PM

## 2015-12-30 DIAGNOSIS — D62 Acute posthemorrhagic anemia: Secondary | ICD-10-CM | POA: Insufficient documentation

## 2015-12-30 DIAGNOSIS — Z436 Encounter for attention to other artificial openings of urinary tract: Secondary | ICD-10-CM | POA: Insufficient documentation

## 2015-12-30 LAB — CBC
HCT: 26 % — ABNORMAL LOW (ref 40.0–52.0)
Hemoglobin: 8.4 g/dL — ABNORMAL LOW (ref 13.0–18.0)
MCH: 30 pg (ref 26.0–34.0)
MCHC: 32.3 g/dL (ref 32.0–36.0)
MCV: 92.8 fL (ref 80.0–100.0)
PLATELETS: 271 10*3/uL (ref 150–440)
RBC: 2.8 MIL/uL — AB (ref 4.40–5.90)
RDW: 15.9 % — AB (ref 11.5–14.5)
WBC: 4.7 10*3/uL (ref 3.8–10.6)

## 2015-12-30 LAB — BASIC METABOLIC PANEL
Anion gap: 8 (ref 5–15)
BUN: 40 mg/dL — AB (ref 6–20)
CALCIUM: 8.1 mg/dL — AB (ref 8.9–10.3)
CO2: 19 mmol/L — ABNORMAL LOW (ref 22–32)
CREATININE: 2.01 mg/dL — AB (ref 0.61–1.24)
Chloride: 114 mmol/L — ABNORMAL HIGH (ref 101–111)
GFR, EST AFRICAN AMERICAN: 33 mL/min — AB (ref >60)
GFR, EST NON AFRICAN AMERICAN: 28 mL/min — AB (ref >60)
Glucose, Bld: 99 mg/dL (ref 65–99)
Potassium: 4.7 mmol/L (ref 3.5–5.1)
SODIUM: 141 mmol/L (ref 135–145)

## 2015-12-30 MED ORDER — MORPHINE SULFATE (PF) 2 MG/ML IV SOLN
1.0000 mg | Freq: Once | INTRAVENOUS | Status: AC
  Administered 2015-12-30: 1 mg via INTRAVENOUS
  Filled 2015-12-30: qty 1

## 2015-12-30 NOTE — Progress Notes (Signed)
Subjective: Austin Contreras is a 82 y.o. male patient seen by urology in recent days due to gross hematuria in the setting of bladder cancer s/p radiation therapy and secondary end stage bladder with radiation hemorrhagic cystitis. He was kept on CBI by Dr. Yves Dill and had PCNs placed bilaterally. The CBI has been clotting off during the day yesterday (friday). Ultimately, he was having a lot of discomfort and the decision was made to stop CBI and rely on the [facility] for renal drainage.  He is now more comfortable. Resting. No suprapubic pain.  Complains of catheter discomfort only.   Objective: Filed Vitals:   12/29/15 2043 12/30/15 0349  BP: 126/66 136/66  Pulse: 70 69  Temp: 97.5 F (36.4 C) 97.5 F (36.4 C)  Resp: 20 18    General: well-developed male, in no acute distress, arousable and conversant HEENT: Atraumatic, moist membranes Neck: supple, trachea midline Lungs: moving air without restriction and without use of accessory muscles Cardiac: Regular rate Abd: Soft, nontender, nondistended, non-palpable bladder Ext: No edema Neuro: CN grossly intact. Moves all extremities BACK: PCNS draining tea colored urine, this is thin however. No clots.  GU: Foley is exiting meatus with no drainage per foley.  No edema or skin changes on the penis or scrotum.   Lab Results  Component Value Date   CREATININE 2.01* 12/30/2015   CREATININE 2.78* 12/29/2015   CREATININE 4.29* 12/28/2015   CBC Latest Ref Rng 12/30/2015 12/29/2015 12/28/2015  WBC 3.8 - 10.6 K/uL 4.7 4.6 6.3  Hemoglobin 13.0 - 18.0 g/dL 8.4(L) 8.1(L) 8.1(L)  Hematocrit 40.0 - 52.0 % 26.0(L) 24.7(L) 24.9(L)  Platelets 150 - 440 K/uL 271 240 241    Allergies: Allergies  Allergen Reactions  . Penicillins Rash and Other (See Comments)    Has patient had a PCN reaction causing immediate rash, facial/tongue/throat swelling, SOB or lightheadedness with hypotension: No Has patient had a PCN reaction causing severe rash involving mucus  membranes or skin necrosis: No Has patient had a PCN reaction that required hospitalization No Has patient had a PCN reaction occurring within the last 10 years: No If all of the above answers are "NO", then may proceed with Cephalosporin use.     Assessment/Plan: Radiation induced hemorrhagic cystitis AKI - improved with PCNs Acute blood loss anemia - hct stable now.  I had a lengthy discussion with the family and patient regarding goals in management of his bladder. It appears that CBI and the foley are causing a lot of pain and thus they wish this to be stopped. We discussed that the function of the bladder is to drain his kidneys and that the [facility] are currently doing this job.  We discussed that his kidney function appears to be improving with the PCN.  Management of his urinary drainage in this fashion is appropriate.    We discussed that prior evaluations of his bladder appear to reveal a low capacity, overactive bladder and thus its function is unlikely to improve in the long term.  At this point, it is reasonable to allows the bladder to rest, and potentially allow clot to form for tamponade of the bleeding mucosa.  They understand that this may lead to complete bladder dysfunction and complete reliance on PCNs for urinary drainage in the long term.  They verbalized agreement with this plan.   We will have his foley removed and CBI stopped.  Should he developed significant suprapubic pain then we would recommend a bladder ultrasound to assess clot  burden.  I do not foresee this as a likely issue in this patient however.    Aviva Signs, MD 12/30/2015

## 2015-12-30 NOTE — Progress Notes (Signed)
Subjective:  Patient is known to our practice from outpatient. He is usually followed by Dr. Holley Raring. He was last seen in October 2016  He was admitted on January 6 for hematuria (radiation cystitis), He was placed on continuous bladder irrigation Hospital course is  complicated by acute renal failure with creatinine rising to 4.29, which was thought to be secondary to bilateral hydronephrosis as seen on kidney ultrasound Admission creatinine was 1.73/GFR of 34 He underwent bilateral nephrostomy tubes by VIR on 1/12 It has resulted in improvement of serum creatinine down to 2.78 - > 2.08. UOP 2500 total from b/l nephrostomies He was seen by Dr Michiel Sites. It was decided to remove the foley for patient comfort. It is expected that the clot in bladder will  Tamponade the bleeding mucosa  He is feeling better today Trying to eat more    Objective:  Vital signs in last 24 hours:  Temp:  [97.5 F (36.4 C)-97.6 F (36.4 C)] 97.5 F (36.4 C) (01/14 0349) Pulse Rate:  [69-70] 69 (01/14 0349) Resp:  [18-20] 18 (01/14 0349) BP: (126-136)/(66-68) 136/66 mmHg (01/14 0349) SpO2:  [95 %-97 %] 97 % (01/14 0349)  Weight change:  Filed Weights   12/22/15 1048  Weight: 77.565 kg (171 lb)    Intake/Output:    Intake/Output Summary (Last 24 hours) at 12/30/15 1145 Last data filed at 12/30/15 1059  Gross per 24 hour  Intake   2261 ml  Output   3150 ml  Net   -889 ml     Physical Exam: General:  elderly gentleman, laying and had   HEENT  dry mucous membranes   Neck  supple   Pulm/lungs  normal respiratory effort   CVS/Heart  no rub or gallop   Abdomen:   soft, nontender   Extremities:  no peripheral edema   Neurologic:  alert, able to answer questions   Skin:  warm, dry    B/l nephrostomies       Basic Metabolic Panel:   Recent Labs Lab 12/26/15 0416 12/27/15 0610 12/28/15 0517 12/29/15 0634 12/30/15 0512  NA 136 138 141 136 141  K 4.8 5.6* 4.6 4.4 4.7  CL 107 109 115*  110 114*  CO2 21* 18* 20* 16* 19*  GLUCOSE 100* 98 94 86 99  BUN 53* 65* 67* 50* 40*  CREATININE 3.71* 4.29* 4.29* 2.78* 2.01*  CALCIUM 8.2* 8.5* 8.1* 7.8* 8.1*     CBC:  Recent Labs Lab 12/26/15 0416 12/27/15 0610 12/28/15 0517 12/29/15 0634 12/30/15 0512  WBC 10.6 11.2* 6.3 4.6 4.7  HGB 8.8* 9.2* 8.1* 8.1* 8.4*  HCT 26.6* 28.3* 24.9* 24.7* 26.0*  MCV 92.4 92.5 93.1 91.9 92.8  PLT 191 257 241 240 271      Microbiology:  No results found for this or any previous visit (from the past 720 hour(s)).  Coagulation Studies: No results for input(s): LABPROT, INR in the last 72 hours.  Urinalysis: No results for input(s): COLORURINE, LABSPEC, PHURINE, GLUCOSEU, HGBUR, BILIRUBINUR, KETONESUR, PROTEINUR, UROBILINOGEN, NITRITE, LEUKOCYTESUR in the last 72 hours.  Invalid input(s): APPERANCEUR    Imaging: [place] Intraoperative  12/28/2015  CLINICAL DATA:  Ultrasound was provided for use by the ordering physician, and a technical charge was applied by the performing facility.  No radiologist interpretation/professional services rendered.   Ir Nephrostomy Placement Left  12/28/2015  CLINICAL DATA:  Progressive renal failure with evidence of bilateral hydronephrosis. The patient requires percutaneous nephrostomy tube placement. EXAM: 1. ULTRASOUND GUIDANCE FOR PUNCTURE  OF THE LEFT RENAL COLLECTING SYSTEM. 2. LEFT PERCUTANEOUS NEPHROSTOMY TUBE PLACEMENT. 3. ULTRASOUND GUIDANCE FOR PUNCTURE OF THE RIGHT RENAL COLLECTING SYSTEM. 4. RIGHT PERCUTANEOUS NEPHROSTOMY TUBE PLACEMENT. COMPARISON:  Renal ultrasound on 12/26/2015 ANESTHESIA/SEDATION: 3.0 mg IV Versed; 75 mcg IV Fentanyl. Total Moderate Sedation Time 30 minutes CONTRAST:  10 ml Omnipaque 300 MEDICATIONS: 400 mg IV Cipro. Ciprofloxacin was given within two hours of incision. FLUOROSCOPY TIME:  1 minutes and 30 seconds. PROCEDURE: The procedure, risks, benefits, and alternatives were explained to the patient. Questions regarding the  procedure were encouraged and answered. The patient understands and consents to the procedure. Bilateral flank regions were prepped with chlorhexidine in a sterile fashion, and a sterile drape was applied covering the operative field. A sterile gown and sterile gloves were used for the procedure. Local anesthesia was provided with 1% Lidocaine. Ultrasound was used to localize the left kidney. Under direct ultrasound guidance, a 21 gauge needle was advanced into the renal collecting system. Ultrasound image documentation was performed. Aspiration of urine sample was performed followed by contrast injection. A transitional dilator was advanced over a guidewire. Percutaneous tract dilatation was then performed over the guidewire. A 10 French percutaneous nephrostomy tube was then advanced and formed in the collecting system. Catheter position was confirmed by fluoroscopy after contrast injection. The right kidney was localized by ultrasound. A 21 gauge needle was advanced into the collecting system under ultrasound guidance. Contrast was injected. The tract was dilated over a wire and a 10 French percutaneous nephrostomy tube advanced and formed in the collecting system. Catheter positioning was confirmed by fluoroscopy after contrast injection. Both catheters were connected to gravity drainage bags and secured at the skin with Prolene retention sutures and StatLock devices. COMPLICATIONS: None. FINDINGS: Ultrasound demonstrates moderate bilateral hydronephrosis. Bilateral nephrostomy tubes were successfully placed and formed in each renal pelvis. There is good return of urine from both catheters after placement. IMPRESSION: Bilateral percutaneous nephrostomy tube placement. Bilateral 10 French nephrostomy tubes were placed and formed at the level of each renal pelvis. Electronically Signed   By: Aletta Edouard M.D.   On: 12/28/2015 16:13   Ir Nephrostomy Placement Right  12/28/2015  CLINICAL DATA:  Progressive  renal failure with evidence of bilateral hydronephrosis. The patient requires percutaneous nephrostomy tube placement. EXAM: 1. ULTRASOUND GUIDANCE FOR PUNCTURE OF THE LEFT RENAL COLLECTING SYSTEM. 2. LEFT PERCUTANEOUS NEPHROSTOMY TUBE PLACEMENT. 3. ULTRASOUND GUIDANCE FOR PUNCTURE OF THE RIGHT RENAL COLLECTING SYSTEM. 4. RIGHT PERCUTANEOUS NEPHROSTOMY TUBE PLACEMENT. COMPARISON:  Renal ultrasound on 12/26/2015 ANESTHESIA/SEDATION: 3.0 mg IV Versed; 75 mcg IV Fentanyl. Total Moderate Sedation Time 30 minutes CONTRAST:  10 ml Omnipaque 300 MEDICATIONS: 400 mg IV Cipro. Ciprofloxacin was given within two hours of incision. FLUOROSCOPY TIME:  1 minutes and 30 seconds. PROCEDURE: The procedure, risks, benefits, and alternatives were explained to the patient. Questions regarding the procedure were encouraged and answered. The patient understands and consents to the procedure. Bilateral flank regions were prepped with chlorhexidine in a sterile fashion, and a sterile drape was applied covering the operative field. A sterile gown and sterile gloves were used for the procedure. Local anesthesia was provided with 1% Lidocaine. Ultrasound was used to localize the left kidney. Under direct ultrasound guidance, a 21 gauge needle was advanced into the renal collecting system. Ultrasound image documentation was performed. Aspiration of urine sample was performed followed by contrast injection. A transitional dilator was advanced over a guidewire. Percutaneous tract dilatation was then performed over the guidewire. A 10 [place]  percutaneous nephrostomy tube was then advanced and formed in the collecting system. Catheter position was confirmed by fluoroscopy after contrast injection. The right kidney was localized by ultrasound. A 21 gauge needle was advanced into the collecting system under ultrasound guidance. Contrast was injected. The tract was dilated over a wire and a 10 French percutaneous nephrostomy tube advanced and formed  in the collecting system. Catheter positioning was confirmed by fluoroscopy after contrast injection. Both catheters were connected to gravity drainage bags and secured at the skin with Prolene retention sutures and StatLock devices. COMPLICATIONS: None. FINDINGS: Ultrasound demonstrates moderate bilateral hydronephrosis. Bilateral nephrostomy tubes were successfully placed and formed in each renal pelvis. There is good return of urine from both catheters after placement. IMPRESSION: Bilateral percutaneous nephrostomy tube placement. Bilateral 10 French nephrostomy tubes were placed and formed at the level of each renal pelvis. Electronically Signed   By: Aletta Edouard M.D.   On: 12/28/2015 16:13     Medications:   . sodium chloride 50 mL/hr at 12/29/15 2120   . docusate sodium  100 mg Oral BID  . feeding supplement (ENSURE ENLIVE)  237 mL Oral BID BM  . pantoprazole  40 mg Oral QAC breakfast  . polyethylene glycol  17 g Oral Daily  . simvastatin  10 mg Oral QHS  . sodium chloride  3 mL Intravenous Q12H  . sotalol  80 mg Oral BID   acetaminophen **OR** acetaminophen, hydrocortisone, lidocaine (PF), morphine injection, ondansetron **OR** ondansetron (ZOFRAN) IV, opium-belladonna, oxyCODONE, simethicone  Assessment/ Plan:  82 y.o. 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 Acute renal failure on chronic kidney disease stage III with b/l hydronephrosis Baseline creatinine 1.7/GFR of 34 at the time of admission Cause for acute renal failure is b/l hydronephrosis - serum creatinine has improved after nephrostomies were placed  2. Hematuria secondary to radiation cystitis CBI has been discontinued F/u with urology    LOS: 8 Jaslynn Thome 1/14/201711:45 AM

## 2015-12-30 NOTE — Progress Notes (Signed)
Dr. Benjie Karvonen gave RN verbal order to give pt 1 mg of morphine due to pt.'s pain at that time. Will continue to monitor pt.  Angus Seller

## 2015-12-30 NOTE — Progress Notes (Signed)
RN removed foley catheter that was being used for CBI due to orders from Dr. Michiel Sites, who was seeing pt at this time. Pt tolerated removal of foley very well and stated "he feels relief from the removal of the catheter." will continue to monitor pt.  Austin Contreras

## 2015-12-30 NOTE — Progress Notes (Signed)
[facility] [place] NAME: Austin Contreras    MR#:  [id number]  DATE OF BIRTH:  [birth year]  SUBJECTIVE:  . Patient had clots in the CBI yesterday. Currently CBI is turned off and we are awaiting urology evaluation for further management. REVIEW OF SYSTEMS:    Review of Systems  Constitutional: Negative for fever, chills and malaise/fatigue.  HENT: Negative for sore throat.   Eyes: Negative for blurred vision.  Respiratory: Negative for cough, hemoptysis, shortness of breath and wheezing.   Cardiovascular: Negative for chest pain, palpitations and leg swelling.  Gastrointestinal: Negative for nausea, vomiting, abdominal pain, diarrhea and blood in stool.  Genitourinary: Positive for hematuria. Negative for dysuria, urgency and frequency.  Musculoskeletal: Negative for back pain.  Neurological: Negative for dizziness, tremors and headaches.  Endo/Heme/Allergies: Does not bruise/bleed easily.    Tolerating Diet: Yes      DRUG ALLERGIES:   Allergies  Allergen Reactions  . Penicillins Rash and Other (See Comments)    Has patient had a PCN reaction causing immediate rash, facial/tongue/throat swelling, SOB or lightheadedness with hypotension: No Has patient had a PCN reaction causing severe rash involving mucus membranes or skin necrosis: No Has patient had a PCN reaction that required hospitalization No Has patient had a PCN reaction occurring within the last 10 years: No If all of the above answers are "NO", then may proceed with Cephalosporin use.    VITALS:  Blood pressure 136/66, pulse 69, temperature 97.5 F (36.4 C), temperature source Oral, resp. rate 18, height 5\' 10"  (1.778 m), weight 77.565 kg (171 lb), SpO2 97 %.  PHYSICAL EXAMINATION:   Physical Exam  Constitutional: He is oriented to person, place, and time. No distress.  HENT:  Head: Normocephalic.  Eyes: No scleral icterus.  Neck: Normal range of motion.  Neck supple. No JVD present. No tracheal deviation present.  Cardiovascular: Normal rate, regular rhythm and normal heart sounds.  Exam reveals no gallop and no friction rub.   No murmur heard. Pulmonary/Chest: Effort normal and breath sounds normal. No respiratory distress. He has no wheezes. He has no rales. He exhibits no tenderness.  Abdominal: Soft. Bowel sounds are normal. He exhibits no distension and no mass. There is no tenderness. There is no rebound and no guarding.  Musculoskeletal: Normal range of motion. He exhibits no edema.  Neurological: He is alert and oriented to person, place, and time.  Skin: Skin is warm. No rash noted. No erythema.  Psychiatric: Affect and judgment normal.      LABORATORY PANEL:   CBC  Recent Labs Lab 12/30/15 0512  WBC 4.7  HGB 8.4*  HCT 26.0*  PLT 271   ------------------------------------------------------------------------------------------------------------------  Chemistries   Recent Labs Lab 12/27/15 0610  12/30/15 0512  NA 138  < > 141  K 5.6*  < > 4.7  CL 109  < > 114*  CO2 18*  < > 19*  GLUCOSE 98  < > 99  BUN 65*  < > 40*  CREATININE 4.29*  < > 2.01*  CALCIUM 8.5*  < > 8.1*  AST 19  --   --   ALT 32  --   --   ALKPHOS 81  --   --   BILITOT 2.5*  --   --   < > = values in this interval not displayed. ------------------------------------------------------------------------------------------------------------------  Cardiac Enzymes No results for input(s): TROPONINI in the last 168 hours. ------------------------------------------------------------------------------------------------------------------  RADIOLOGY:  [place]  Intraoperative  12/28/2015  CLINICAL DATA:  Ultrasound was provided for use by the ordering physician, and a technical charge was applied by the performing facility.  No radiologist interpretation/professional services rendered.   Ir Nephrostomy Placement Left  12/28/2015  CLINICAL DATA:  Progressive  renal failure with evidence of bilateral hydronephrosis. The patient requires percutaneous nephrostomy tube placement. EXAM: 1. ULTRASOUND GUIDANCE FOR PUNCTURE OF THE LEFT RENAL COLLECTING SYSTEM. 2. LEFT PERCUTANEOUS NEPHROSTOMY TUBE PLACEMENT. 3. ULTRASOUND GUIDANCE FOR PUNCTURE OF THE RIGHT RENAL COLLECTING SYSTEM. 4. RIGHT PERCUTANEOUS NEPHROSTOMY TUBE PLACEMENT. COMPARISON:  Renal ultrasound on 12/26/2015 ANESTHESIA/SEDATION: 3.0 mg IV Versed; 75 mcg IV Fentanyl. Total Moderate Sedation Time 30 minutes CONTRAST:  10 ml Omnipaque 300 MEDICATIONS: 400 mg IV Cipro. Ciprofloxacin was given within two hours of incision. FLUOROSCOPY TIME:  1 minutes and 30 seconds. PROCEDURE: The procedure, risks, benefits, and alternatives were explained to the patient. Questions regarding the procedure were encouraged and answered. The patient understands and consents to the procedure. Bilateral flank regions were prepped with chlorhexidine in a sterile fashion, and a sterile drape was applied covering the operative field. A sterile gown and sterile gloves were used for the procedure. Local anesthesia was provided with 1% Lidocaine. Ultrasound was used to localize the left kidney. Under direct ultrasound guidance, a 21 gauge needle was advanced into the renal collecting system. Ultrasound image documentation was performed. Aspiration of urine sample was performed followed by contrast injection. A transitional dilator was advanced over a guidewire. Percutaneous tract dilatation was then performed over the guidewire. A 10 French percutaneous nephrostomy tube was then advanced and formed in the collecting system. Catheter position was confirmed by fluoroscopy after contrast injection. The right kidney was localized by ultrasound. A 21 gauge needle was advanced into the collecting system under ultrasound guidance. Contrast was injected. The tract was dilated over a wire and a 10 French percutaneous nephrostomy tube advanced and formed  in the collecting system. Catheter positioning was confirmed by fluoroscopy after contrast injection. Both catheters were connected to gravity drainage bags and secured at the skin with Prolene retention sutures and StatLock devices. COMPLICATIONS: None. FINDINGS: Ultrasound demonstrates moderate bilateral hydronephrosis. Bilateral nephrostomy tubes were successfully placed and formed in each renal pelvis. There is good return of urine from both catheters after placement. IMPRESSION: Bilateral percutaneous nephrostomy tube placement. Bilateral 10 French nephrostomy tubes were placed and formed at the level of each renal pelvis. Electronically Signed   By: Aletta Edouard M.D.   On: 12/28/2015 16:13   Ir Nephrostomy Placement Right  12/28/2015  CLINICAL DATA:  Progressive renal failure with evidence of bilateral hydronephrosis. The patient requires percutaneous nephrostomy tube placement. EXAM: 1. ULTRASOUND GUIDANCE FOR PUNCTURE OF THE LEFT RENAL COLLECTING SYSTEM. 2. LEFT PERCUTANEOUS NEPHROSTOMY TUBE PLACEMENT. 3. ULTRASOUND GUIDANCE FOR PUNCTURE OF THE RIGHT RENAL COLLECTING SYSTEM. 4. RIGHT PERCUTANEOUS NEPHROSTOMY TUBE PLACEMENT. COMPARISON:  Renal ultrasound on 12/26/2015 ANESTHESIA/SEDATION: 3.0 mg IV Versed; 75 mcg IV Fentanyl. Total Moderate Sedation Time 30 minutes CONTRAST:  10 ml Omnipaque 300 MEDICATIONS: 400 mg IV Cipro. Ciprofloxacin was given within two hours of incision. FLUOROSCOPY TIME:  1 minutes and 30 seconds. PROCEDURE: The procedure, risks, benefits, and alternatives were explained to the patient. Questions regarding the procedure were encouraged and answered. The patient understands and consents to the procedure. Bilateral flank regions were prepped with chlorhexidine in a sterile fashion, and a sterile drape was applied covering the operative field. A sterile gown and sterile gloves were used for the procedure.  Local anesthesia was provided with 1% Lidocaine. Ultrasound was used to  localize the left kidney. Under direct ultrasound guidance, a 21 gauge needle was advanced into the renal collecting system. Ultrasound image documentation was performed. Aspiration of urine sample was performed followed by contrast injection. A transitional dilator was advanced over a guidewire. Percutaneous tract dilatation was then performed over the guidewire. A 10 French percutaneous nephrostomy tube was then advanced and formed in the collecting system. Catheter position was confirmed by fluoroscopy after contrast injection. The right kidney was localized by ultrasound. A 21 gauge needle was advanced into the collecting system under ultrasound guidance. Contrast was injected. The tract was dilated over a wire and a 10 French percutaneous nephrostomy tube advanced and formed in the collecting system. Catheter positioning was confirmed by fluoroscopy after contrast injection. Both catheters were connected to gravity drainage bags and secured at the skin with Prolene retention sutures and StatLock devices. COMPLICATIONS: None. FINDINGS: Ultrasound demonstrates moderate bilateral hydronephrosis. Bilateral nephrostomy tubes were successfully placed and formed in each renal pelvis. There is good return of urine from both catheters after placement. IMPRESSION: Bilateral percutaneous nephrostomy tube placement. Bilateral 10 French nephrostomy tubes were placed and formed at the level of each renal pelvis. Electronically Signed   By: Aletta Edouard M.D.   On: 12/28/2015 16:13     ASSESSMENT AND PLAN:   82 year old male with a history of radiation proctitis and radiation cystitis with muscle invasive bladder cancer who presents with hematuria.  1. Hematuria: Patient has a large-bore 3-way Foley catheter with continuous bladder irrigation which will be continued for now as he has a little bit of blood in urine. I have spoken with Dr Rogers Blocker about patient. Patient has three options 1) continue with FOLEY 2)  bilateral nephrostomy tubes or Palliative care. He essentially has end stage radiation cystitis according to Dr Rogers Blocker.  Family and patient have decided on nephrostomy tubes. Patient had nephrostomy tubes placed on 12/28/2015 by IR.  Patient will need follow-up with nephrology/urology and a change in his nephrostomy tubes every month. Patient continues to have clots and therefore urology services on call will come and evaluate the patient for further recommendations and management.    2. Acute blood loss anemia: This is from hematuria. Patient is status post 1 unit PRBCs. Hemoglobin stable. Patient does follow up at the cancer center for B12 injections.  3. Atrial fibrillation: Continue sotalol. Patient is not on anticoagulation due to history of GI bleed and hematuria.  4. Essential hypertension: Blood pressure is acceptable.   5. Acute renal failure: Renal ultrasound shows bilateral hydronephrosis. Creatinine has greatly improved with the placement of bilateral nephrostomy tubes..  6. Bladder spasm: Continue B and O suppositories.  7. Hypoxia: Chest x-ray shows resolving interstitial edema with small bilateral pleural effusions. Hypoxia has resolved  Management plans discussed with the patient and he is in agreement.  CODE STATUS: Full.  TOTAL TIME TAKING CARE OF THIS PATIENT: 31 minutes.  Plan of care discussed with family.   PT is still recommending home with home health care.  Discharge likely early next week  Sumedha Munnerlyn M.D on 12/30/2015 at 9:03 AM  Between 7am to 6pm - Pager - [phone] After 6pm go to [email or web address] - password [email or web address] [facility]  Office  [phone]  CC: Primary care physician; [name], MD  Note: This dictation was prepared with Dragon dictation along with smaller phrase technology. Any transcriptional errors that result from this process are  unintentional. 

## 2015-12-30 NOTE — Progress Notes (Signed)
Pt ambulated to the chair from the bed and sat in the chair to eat lunch. Tolerated movement very well and has not complained or asked for anything for pain. Will continue to monitor pt.   Austin Contreras

## 2015-12-31 LAB — BASIC METABOLIC PANEL
ANION GAP: 6 (ref 5–15)
BUN: 29 mg/dL — AB (ref 6–20)
CALCIUM: 7.9 mg/dL — AB (ref 8.9–10.3)
CO2: 21 mmol/L — ABNORMAL LOW (ref 22–32)
Chloride: 113 mmol/L — ABNORMAL HIGH (ref 101–111)
Creatinine, Ser: 1.57 mg/dL — ABNORMAL HIGH (ref 0.61–1.24)
GFR calc Af Amer: 44 mL/min — ABNORMAL LOW (ref >60)
GFR, EST NON AFRICAN AMERICAN: 38 mL/min — AB (ref >60)
GLUCOSE: 107 mg/dL — AB (ref 65–99)
Potassium: 4.5 mmol/L (ref 3.5–5.1)
SODIUM: 140 mmol/L (ref 135–145)

## 2015-12-31 LAB — CBC
HCT: 24.7 % — ABNORMAL LOW (ref 40.0–52.0)
Hemoglobin: 8.3 g/dL — ABNORMAL LOW (ref 13.0–18.0)
MCH: 31.2 pg (ref 26.0–34.0)
MCHC: 33.6 g/dL (ref 32.0–36.0)
MCV: 92.9 fL (ref 80.0–100.0)
Platelets: 261 10*3/uL (ref 150–440)
RBC: 2.66 MIL/uL — ABNORMAL LOW (ref 4.40–5.90)
RDW: 15.9 % — AB (ref 11.5–14.5)
WBC: 5.3 10*3/uL (ref 3.8–10.6)

## 2015-12-31 NOTE — Progress Notes (Signed)
Subjective:  Patient is known to our practice from outpatient.  He was last seen in October 2016  He was admitted on January 6 for hematuria (radiation cystitis), He was placed on continuous bladder irrigation Hospital course is  complicated by acute renal failure with creatinine rising to 4.29, which was thought to be secondary to bilateral hydronephrosis as seen on kidney ultrasound Admission creatinine was 1.73/GFR of 34 He underwent bilateral nephrostomy tubes by VIR on 1/12 It has resulted in improvement of serum creatinine down to 2.78 - > 2.08->1.79 . UOP is good from b/l nephrostomies He was seen by Dr Michiel Sites. It was decided to remove the foley for patient comfort. It is expected that the clot in bladder will Tamponade the bleeding mucosa  He is feeling better today. Ate a good breakfast. Will try to walk with PT later today   Objective:  Vital signs in last 24 hours:  Temp:  [97.5 F (36.4 C)-98.2 F (36.8 C)] 98 F (36.7 C) (01/15 0834) Pulse Rate:  [69-71] 70 (01/15 0834) Resp:  [16-20] 16 (01/15 0834) BP: (127-138)/(65-95) 131/95 mmHg (01/15 0834) SpO2:  [96 %-99 %] 99 % (01/15 0834)  Weight change:  Filed Weights   12/22/15 1048  Weight: 77.565 kg (171 lb)    Intake/Output:    Intake/Output Summary (Last 24 hours) at 12/31/15 1055 Last data filed at 12/31/15 0900  Gross per 24 hour  Intake 1551.9 ml  Output   2235 ml  Net -683.1 ml     Physical Exam: General:  elderly gentleman, laying and had   HEENT  dry mucous membranes   Neck  supple   Pulm/lungs  normal respiratory effort   CVS/Heart  no rub or gallop   Abdomen:   soft, nontender   Extremities:  no peripheral edema   Neurologic:  alert, able to answer questions   Skin:  warm, dry    B/l nephrostomies       Basic Metabolic Panel:   Recent Labs Lab 12/27/15 0610 12/28/15 0517 12/29/15 0634 12/30/15 0512 12/31/15 0441  NA 138 141 136 141 140  K 5.6* 4.6 4.4 4.7 4.5  CL 109 115*  110 114* 113*  CO2 18* 20* 16* 19* 21*  GLUCOSE 98 94 86 99 107*  BUN 65* 67* 50* 40* 29*  CREATININE 4.29* 4.29* 2.78* 2.01* 1.57*  CALCIUM 8.5* 8.1* 7.8* 8.1* 7.9*     CBC:  Recent Labs Lab 12/27/15 0610 12/28/15 0517 12/29/15 0634 12/30/15 0512 12/31/15 0441  WBC 11.2* 6.3 4.6 4.7 5.3  HGB 9.2* 8.1* 8.1* 8.4* 8.3*  HCT 28.3* 24.9* 24.7* 26.0* 24.7*  MCV 92.5 93.1 91.9 92.8 92.9  PLT 257 241 240 271 261      Microbiology:  No results found for this or any previous visit (from the past 720 hour(s)).  Coagulation Studies: No results for input(s): LABPROT, INR in the last 72 hours.  Urinalysis: No results for input(s): COLORURINE, LABSPEC, PHURINE, GLUCOSEU, HGBUR, BILIRUBINUR, KETONESUR, PROTEINUR, UROBILINOGEN, NITRITE, LEUKOCYTESUR in the last 72 hours.  Invalid input(s): APPERANCEUR    Imaging: No results found.   Medications:   . sodium chloride 50 mL/hr at 12/30/15 1531   . docusate sodium  100 mg Oral BID  . feeding supplement (ENSURE ENLIVE)  237 mL Oral BID BM  . pantoprazole  40 mg Oral QAC breakfast  . polyethylene glycol  17 g Oral Daily  . simvastatin  10 mg Oral QHS  . sodium chloride  3 mL Intravenous Q12H  . sotalol  80 mg Oral BID   acetaminophen **OR** acetaminophen, hydrocortisone, lidocaine (PF), morphine injection, ondansetron **OR** ondansetron (ZOFRAN) IV, opium-belladonna, oxyCODONE, simethicone  Assessment/ Plan:  82 y.o. male with known history of afib s/p pacemaker, h/o GI bleed, CKD stage 3, Transitional cell bladder cancer s/p TURBT in oct 2016 and intravesical mitomycin , end-stage radiation cystitis  1. Acute renal failure on chronic kidney disease stage III with b/l hydronephrosis Baseline creatinine 1.7/GFR of 34 at the time of admission Cause for acute renal failure is b/l hydronephrosis - serum creatinine has improved after nephrostomies were placed Outpatient f/u   2. Hematuria secondary to radiation cystitis CBI  has been discontinued F/u with urology    LOS: 9 Rondel Episcopo 1/15/201710:55 AM

## 2015-12-31 NOTE — Progress Notes (Signed)
[facility] at [place] NAME: Austin Contreras    MR#:  [id number]  DATE OF BIRTH:  28-Jul-1926  SUBJECTIVE:  . Patient had a good day yesterday after removal of CBI. Patient is not complaining of any pain at this time.  REVIEW OF SYSTEMS:    Review of Systems  Constitutional: Negative for fever, chills and malaise/fatigue.  HENT: Negative for sore throat.   Eyes: Negative for blurred vision.  Respiratory: Negative for cough, hemoptysis, shortness of breath and wheezing.   Cardiovascular: Negative for chest pain, palpitations and leg swelling.  Gastrointestinal: Negative for nausea, vomiting, abdominal pain, diarrhea and blood in stool.  Genitourinary: Negative for dysuria, urgency, frequency and hematuria.  Musculoskeletal: Negative for back pain.  Neurological: Negative for dizziness, tremors and headaches.  Endo/Heme/Allergies: Does not bruise/bleed easily.    Tolerating Diet: Yes      DRUG ALLERGIES:   Allergies  Allergen Reactions  . Penicillins Rash and Other (See Comments)    Has patient had a PCN reaction causing immediate rash, facial/tongue/throat swelling, SOB or lightheadedness with hypotension: No Has patient had a PCN reaction causing severe rash involving mucus membranes or skin necrosis: No Has patient had a PCN reaction that required hospitalization No Has patient had a PCN reaction occurring within the last 10 years: No If all of the above answers are "NO", then may proceed with Cephalosporin use.    VITALS:  Blood pressure 131/95, pulse 70, temperature 98 F (36.7 C), temperature source Oral, resp. rate 16, height 5\' 10"  (1.778 m), weight 77.565 kg (171 lb), SpO2 99 %.  PHYSICAL EXAMINATION:   Physical Exam  Constitutional: He is oriented to person, place, and time. No distress.  HENT:  Head: Normocephalic.  Eyes: No scleral icterus.  Neck: Normal range of motion. Neck supple. No JVD present. No  tracheal deviation present.  Cardiovascular: Normal rate, regular rhythm and normal heart sounds.  Exam reveals no gallop and no friction rub.   No murmur heard. Pulmonary/Chest: Effort normal and breath sounds normal. No respiratory distress. He has no wheezes. He has no rales. He exhibits no tenderness.  Abdominal: Soft. Bowel sounds are normal. He exhibits no distension and no mass. There is no tenderness. There is no rebound and no guarding.  Bilateral nephrostomy tubes  Musculoskeletal: Normal range of motion. He exhibits no edema.  Neurological: He is alert and oriented to person, place, and time.  Skin: Skin is warm. No rash noted. No erythema.  Psychiatric: Affect and judgment normal.      LABORATORY PANEL:   CBC  Recent Labs Lab 12/31/15 0441  WBC 5.3  HGB 8.3*  HCT 24.7*  PLT 261   ------------------------------------------------------------------------------------------------------------------  Chemistries   Recent Labs Lab 12/27/15 0610  12/31/15 0441  NA 138  < > 140  K 5.6*  < > 4.5  CL 109  < > 113*  CO2 18*  < > 21*  GLUCOSE 98  < > 107*  BUN 65*  < > 29*  CREATININE 4.29*  < > 1.57*  CALCIUM 8.5*  < > 7.9*  AST 19  --   --   ALT 32  --   --   ALKPHOS 81  --   --   BILITOT 2.5*  --   --   < > = values in this interval not displayed. ------------------------------------------------------------------------------------------------------------------  Cardiac Enzymes No results for input(s): TROPONINI in the last 168 hours. ------------------------------------------------------------------------------------------------------------------  RADIOLOGY:  No results found.   ASSESSMENT AND PLAN:   82 year old male with a history of radiation proctitis and radiation cystitis with muscle invasive bladder cancer who presents with hematuria.  1. Hematuria: Patient had CBI and 3-way Foley catheter placed. This was discontinued June or 14th. Patient has  improved symptoms as far as pain. Patient now has bilateral nephrostomy tubes which are draining clear urine. If he has severe abdominal pain/bladder pain then as per urology consultation we should order a bladder ultrasound to evaluate for clot burden.   Patient had nephrostomy tubes placed on 12/28/2015 by IR.  Patient will need follow-up with nephrology/urology and a change in his nephrostomy tubes every month.   2. Acute blood loss anemia: This is from hematuria. Patient is status post 1 unit PRBCs. Hemoglobin stable. Patient does follow up at the cancer center for B12 injections.  3. Atrial fibrillation: Continue sotalol. Patient is not on anticoagulation due to history of GI bleed and hematuria.  4. Essential hypertension: Blood pressure is acceptable.   5. Acute on chronic renal failure stage III: Renal ultrasound showedbilateral hydronephrosis. Creatinine has greatly improved with the placement of bilateral nephrostomy tubes and he is essentially back to his baseline.  6. Bladder spasm: Continue B and O suppositories when necessary  7. Hypoxia: Chest x-ray shows resolving interstitial edema with small bilateral pleural effusions. Hypoxia has resolved  Management plans discussed with the patient and he is in agreement.  CODE STATUS: Full.  TOTAL TIME TAKING CARE OF THIS PATIENT: 29inutes.  Plan of care discussed with family.   PT is  recommending home with home health care.  Discharge likely tomorrow Jalynn Waddell M.D on 12/31/2015 at 10:02 AM  Between 7am to 6pm - Pager - [phone] After 6pm go to [email or web address] - password [email or web address] [facility]  Office  [phone]  CC: Primary care physician; [name], MD  Note: This dictation was prepared with Dragon dictation along with smaller phrase technology. Any transcriptional errors that result from this process are unintentional.

## 2016-01-01 LAB — BASIC METABOLIC PANEL
Anion gap: 7 (ref 5–15)
BUN: 23 mg/dL — AB (ref 6–20)
CHLORIDE: 111 mmol/L (ref 101–111)
CO2: 21 mmol/L — ABNORMAL LOW (ref 22–32)
Calcium: 7.9 mg/dL — ABNORMAL LOW (ref 8.9–10.3)
Creatinine, Ser: 1.54 mg/dL — ABNORMAL HIGH (ref 0.61–1.24)
GFR calc Af Amer: 45 mL/min — ABNORMAL LOW (ref >60)
GFR, EST NON AFRICAN AMERICAN: 39 mL/min — AB (ref >60)
GLUCOSE: 106 mg/dL — AB (ref 65–99)
POTASSIUM: 4.7 mmol/L (ref 3.5–5.1)
Sodium: 139 mmol/L (ref 135–145)

## 2016-01-01 MED ORDER — ACETAMINOPHEN-CODEINE #3 300-30 MG PO TABS: 1.0000 | ORAL_TABLET | ORAL | Status: DC | PRN

## 2016-01-01 MED ORDER — BELLADONNA ALKALOIDS-OPIUM 16.2-60 MG RE SUPP: 1.0000 | Freq: Four times a day (QID) | RECTAL | Status: DC | PRN

## 2016-01-01 MED ORDER — ENSURE ENLIVE PO LIQD: 237.0000 mL | Freq: Two times a day (BID) | ORAL | Status: DC

## 2016-01-01 MED ORDER — POLYETHYLENE GLYCOL 3350 17 G PO PACK: 17.0000 g | PACK | Freq: Every day | ORAL | Status: DC

## 2016-01-01 NOTE — Discharge Instructions (Addendum)
Flush bilateral drainage catheters TWICE daily with 10 mL NS injection.    You may shower with nephrostomy dressing on. Change dressing as needed.

## 2016-01-01 NOTE — Care Management Important Message (Signed)
Important Message  Patient Details  Name: Austin Contreras MRN: [id number] Date of Birth: 12-20-1925   Medicare Important Message Given:  Yes    [name] [name] 01/01/2016, 9:25 AM

## 2016-01-01 NOTE — Progress Notes (Signed)
[facility] at [place] NAME: Austin Contreras    MR#:  [id number]  DATE OF BIRTH:  [birth year]  SUBJECTIVE:  . No issues overnight. Patient is related with physical therapy  REVIEW OF SYSTEMS:    Review of Systems  Constitutional: Negative for fever, chills and malaise/fatigue.  HENT: Negative for sore throat.   Eyes: Negative for blurred vision.  Respiratory: Negative for cough, hemoptysis, shortness of breath and wheezing.   Cardiovascular: Negative for chest pain, palpitations and leg swelling.  Gastrointestinal: Negative for nausea, vomiting, abdominal pain, diarrhea and blood in stool.  Genitourinary: Negative for dysuria, urgency, frequency and hematuria.  Musculoskeletal: Negative for back pain.  Neurological: Negative for dizziness, tremors and headaches.  Endo/Heme/Allergies: Does not bruise/bleed easily.    Tolerating Diet: Yes      DRUG ALLERGIES:   Allergies  Allergen Reactions  . Penicillins Rash and Other (See Comments)    Has patient had a PCN reaction causing immediate rash, facial/tongue/throat swelling, SOB or lightheadedness with hypotension: No Has patient had a PCN reaction causing severe rash involving mucus membranes or skin necrosis: No Has patient had a PCN reaction that required hospitalization No Has patient had a PCN reaction occurring within the last 10 years: No If all of the above answers are "NO", then may proceed with Cephalosporin use.    VITALS:  Blood pressure 148/79, pulse 68, temperature 98.2 F (36.8 C), temperature source Oral, resp. rate 20, height 5\' 10"  (1.778 m), weight 77.565 kg (171 lb), SpO2 95 %.  PHYSICAL EXAMINATION:   Physical Exam  Constitutional: He is oriented to person, place, and time. No distress.  HENT:  Head: Normocephalic.  Eyes: No scleral icterus.  Neck: Normal range of motion. Neck supple. No JVD present. No tracheal deviation present.  Cardiovascular:  Normal rate, regular rhythm and normal heart sounds.  Exam reveals no gallop and no friction rub.   No murmur heard. Pulmonary/Chest: Effort normal and breath sounds normal. No respiratory distress. He has no wheezes. He has no rales. He exhibits no tenderness.  Abdominal: Soft. Bowel sounds are normal. He exhibits no distension and no mass. There is no tenderness. There is no rebound and no guarding.  Bilateral nephrostomy tubes  Musculoskeletal: Normal range of motion. He exhibits no edema.  Neurological: He is alert and oriented to person, place, and time.  Skin: Skin is warm. No rash noted. No erythema.  Psychiatric: Affect and judgment normal.      LABORATORY PANEL:   CBC  Recent Labs Lab 12/31/15 0441  WBC 5.3  HGB 8.3*  HCT 24.7*  PLT 261   ------------------------------------------------------------------------------------------------------------------  Chemistries   Recent Labs Lab 12/27/15 0610  01/01/16 0519  NA 138  < > 139  K 5.6*  < > 4.7  CL 109  < > 111  CO2 18*  < > 21*  GLUCOSE 98  < > 106*  BUN 65*  < > 23*  CREATININE 4.29*  < > 1.54*  CALCIUM 8.5*  < > 7.9*  AST 19  --   --   ALT 32  --   --   ALKPHOS 81  --   --   BILITOT 2.5*  --   --   < > = values in this interval not displayed. ------------------------------------------------------------------------------------------------------------------  Cardiac Enzymes No results for input(s): TROPONINI in the last 168 hours. ------------------------------------------------------------------------------------------------------------------  RADIOLOGY:  No results found.   ASSESSMENT AND PLAN:  82 year old male with a history of radiation proctitis and radiation cystitis with muscle invasive bladder cancer who presents with hematuria.  1. Hematuria: Patient had CBI and 3-way Foley catheter placed. This was discontinued January14th. Patient has improved symptoms as far as pain. Patient now has  bilateral nephrostomy tubes which are draining clear urine. If he has severe abdominal pain/bladder pain then as per urology consultation we should order a bladder ultrasound to evaluate for clot burden.  Patient had nephrostomy tubes placed on 12/28/2015 by IR. Patient will need follow-up with nephrology/urology and a change in his nephrostomy tubes every month. Patient will need dressing change daily and 10 cc flush with normal saline twice a day inject only. Home health care will be arranged at discharge. Physical therapy was recommending home health with PT.  2. Acute blood loss anemia: This is from hematuria. Patient is status post 1 unit PRBCs. Hemoglobin stable. Patient does follow up at the cancer center for B12 injections.  3. Atrial fibrillation: Continue sotalol. Patient is not on anticoagulation due to history of GI bleed and hematuria.  4. Essential hypertension: Blood pressure is acceptable.   5. Acute on chronic renal failure stage III: Renal ultrasound showedbilateral hydronephrosis. Creatinine has greatly improved with the placement of bilateral nephrostomy tubes and he is essentially back to his baseline.  6. Bladder spasm: Continue B and O suppositories when necessary  7. Hypoxia: Chest x-ray shows resolving interstitial edema with small bilateral pleural effusions. Hypoxia has resolved   Management plans discussed with the patient and family and he is in agreement.  CODE STATUS: Full.  TOTAL TIME TAKING CARE OF THIS PATIENT: 80minutes.  Plan of care discussed with family.   PT is  recommending home with home health care.  Discharge likely today   Pending family and home health care.  Clevon Khader M.D on 01/01/2016 at 10:47 AM  Between 7am to 6pm - Pager - [phone] After 6pm go to [email or web address] - password [email or web address] [facility]  Office  [phone]  CC: Primary care physician; [name], MD  Note: This dictation was  prepared with Dragon dictation along with smaller phrase technology. Any transcriptional errors that result from this process are unintentional.

## 2016-01-01 NOTE — Progress Notes (Signed)
Patient was alert and oriented. Vss. No signs of acute distress. Discharge instructions given. Daughters and son educated on nephrostomy care. Return demonstration observed as evidence of learning from teaching. Patient and family member's questions addressed and answered. Left the floor with a volunteer via wheelchair.

## 2016-01-01 NOTE — Progress Notes (Signed)
Physical Therapy Treatment Patient Details Name: Austin Contreras MRN: [id number] DOB: March 02, 1926 Today's Date: 01/01/2016    History of Present Illness Austin Contreras is a 81 y.o. 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 Patient is s/p cystoscopy with fulguration, clot evacuation and catheter placement.  Pt now s/p B nephrostomy tubes placement 12/28/15.    PT Comments    Pt able to go supine to sit (bed flat and no use of side rail) with SBA (assist for drains), sit to/from stand with RW CGA (CGA to stand; SBA monitoring drains to sit), and ambulate around nursing station with RW CGA (step through gait pattern without loss of balance).  Pt's family educated on functional mobility assist recommendations (and management of B nephrostomy tubes with functional mobility) and also HHPT recommendations.  Attempted to discuss stairs to enter home (pt reports 1 small step to enter or 3 steps from different entrance) with pt and pt's family but pt's family redirecting conversation to more medical concerns (BID nephrostomy irrigation concerns); Dr. Benjie Karvonen called pt's room during session and updated on PT recommendations and also pt's families concerns (pt's family updated on plan for nephrostomy teaching prior to discharge and also [facility] nursing to re-teach nephrostomy irrigation at home per MD [facility]).  Urologist arrived and pt's family requesting to have time to talk with MD so PT session ended.   Follow Up Recommendations  Home health PT;Supervision for mobility/OOB     Equipment Recommendations   (Pt has RW at home; family may be interested in bedside commode (discussed with CM))    Recommendations for Other Services       Precautions / Restrictions Precautions Precautions: Fall Precaution Comments: B nephrostomy tubes Restrictions Weight  Bearing Restrictions: No    Mobility  Bed Mobility Overal bed mobility: Needs Assistance Bed Mobility: Supine to Sit     Supine to sit: Supervision     General bed mobility comments: bed flat; assist for lines  Transfers Overall transfer level: Needs assistance Equipment used: Rolling walker (2 wheeled) Transfers: Sit to/from Stand Sit to Stand: Min guard         General transfer comment: good strong stand; steady without loss of balance  Ambulation/Gait Ambulation/Gait assistance: Min guard Ambulation Distance (Feet): 190 Feet Assistive device: Rolling walker (2 wheeled) Gait Pattern/deviations: Step-through pattern Gait velocity: mild decrease cadence   General Gait Details: steady without loss of balance   Stairs            Wheelchair Mobility    Modified Rankin (Stroke Patients Only)       Balance Overall balance assessment: Needs assistance Sitting-balance support: No upper extremity supported;Feet supported Sitting balance-Leahy Scale: Normal     Standing balance support: Bilateral upper extremity supported (on RW) Standing balance-Leahy Scale: Good                      Cognition Arousal/Alertness: Awake/alert (Pt sleeping upon arrival but easily woke up) Behavior During Therapy: WFL for tasks assessed/performed Overall Cognitive Status: Within Functional Limits for tasks assessed                      Exercises      General Comments General comments (skin integrity, edema, etc.): B nephrostomy tubes intact (secured leg bags for functional mobility)  Pertinent Vitals/Pain Pain Assessment: No/denies pain  Vitals stable and WFL throughout treatment session.    Home Living                      Prior Function            PT Goals (current goals can now be found in the care plan section) Acute Rehab PT Goals Patient Stated Goal: "to be able to go home and get back to PLOF" PT Goal Formulation: With  patient Time For Goal Achievement: 01/07/16 Potential to Achieve Goals: Good Progress towards PT goals: Progressing toward goals    Frequency  Min 2X/week    PT Plan Current plan remains appropriate    Co-evaluation             End of Session Equipment Utilized During Treatment: Gait belt Activity Tolerance: Patient tolerated treatment well Patient left: in chair;with call bell/phone within reach;with chair alarm set;with nursing/sitter in room;with family/visitor present (Urologist present)     Time: [id number] PT Time Calculation (min) (ACUTE ONLY): 29 min  Charges:  $Gait Training: 8-22 mins $Therapeutic Activity: 8-22 mins                    G CodesLeitha Bleak 01/11/2016, 11:00 AM Leitha Bleak, [place]

## 2016-01-01 NOTE — Progress Notes (Signed)
Subjective:  Renal function has improved. Creatinine down to 1.54. Nephrostomies in place and functional.    Objective:  Vital signs in last 24 hours:  Temp:  [98.2 F (36.8 C)-99.8 F (37.7 C)] 98.5 F (36.9 C) (01/16 1313) Pulse Rate:  [68-71] 71 (01/16 1313) Resp:  [19-20] 19 (01/16 1313) BP: (120-148)/(63-79) 120/64 mmHg (01/16 1313) SpO2:  [95 %-97 %] 96 % (01/16 1313)  Weight change:  Filed Weights   12/22/15 1048  Weight: 77.565 kg (171 lb)    Intake/Output:    Intake/Output Summary (Last 24 hours) at 01/01/16 1325 Last data filed at 01/01/16 0900  Gross per 24 hour  Intake 455.83 ml  Output   1150 ml  Net -694.17 ml     Physical Exam: General:  elderly gentleman, sitting up  HEENT  [place]/AT hearing intact OM moist  Neck  supple   Pulm/lungs  normal respiratory effort CTAB  CVS/Heart  no rub or gallop S1S2  Abdomen:   soft, nontender, BS present  Extremities:  no peripheral edema   Neurologic:  alert, able to answer questions   Skin:  warm, dry    B/l nephrostomies       Basic Metabolic Panel:   Recent Labs Lab 12/28/15 0517 12/29/15 0634 12/30/15 0512 12/31/15 0441 01/01/16 0519  NA 141 136 141 140 139  K 4.6 4.4 4.7 4.5 4.7  CL 115* 110 114* 113* 111  CO2 20* 16* 19* 21* 21*  GLUCOSE 94 86 99 107* 106*  BUN 67* 50* 40* 29* 23*  CREATININE 4.29* 2.78* 2.01* 1.57* 1.54*  CALCIUM 8.1* 7.8* 8.1* 7.9* 7.9*     CBC:  Recent Labs Lab 12/27/15 0610 12/28/15 0517 12/29/15 0634 12/30/15 0512 12/31/15 0441  WBC 11.2* 6.3 4.6 4.7 5.3  HGB 9.2* 8.1* 8.1* 8.4* 8.3*  HCT 28.3* 24.9* 24.7* 26.0* 24.7*  MCV 92.5 93.1 91.9 92.8 92.9  PLT 257 241 240 271 261      Microbiology:  No results found for this or any previous visit (from the past 720 hour(s)).  Coagulation Studies: No results for input(s): LABPROT, INR in the last 72 hours.  Urinalysis: No results for input(s): COLORURINE, LABSPEC, PHURINE, GLUCOSEU, HGBUR, BILIRUBINUR,  KETONESUR, PROTEINUR, UROBILINOGEN, NITRITE, LEUKOCYTESUR in the last 72 hours.  Invalid input(s): APPERANCEUR    Imaging: No results found.   Medications:   . sodium chloride 50 mL/hr at 01/01/16 0504   . docusate sodium  100 mg Oral BID  . feeding supplement (ENSURE ENLIVE)  237 mL Oral BID BM  . pantoprazole  40 mg Oral QAC breakfast  . polyethylene glycol  17 g Oral Daily  . simvastatin  10 mg Oral QHS  . sodium chloride  3 mL Intravenous Q12H  . sotalol  80 mg Oral BID   acetaminophen **OR** acetaminophen, hydrocortisone, lidocaine (PF), morphine injection, ondansetron **OR** ondansetron (ZOFRAN) IV, opium-belladonna, oxyCODONE, simethicone  Assessment/ Plan:  82 y.o. male with known history of afib s/p pacemaker, h/o GI bleed, CKD stage 3, Transitional cell bladder cancer s/p TURBT in oct 2016 and intravesical mitomycin , end-stage radiation cystitis  1. Acute renal failure on chronic kidney disease stage III with b/l hydronephrosis Baseline creatinine 1.7/GFR of 34 at the time of admission -  Renal function appears to have improved with urinary decompression.  Patient will continue with nephrostomy drainage.  He will need urology followup as well.We plan to see him back in the office in one to 2 weeks.  2. Hematuria secondary to radiation cystitis CBI has been discontinued F/u with urology    LOS: 10 Austin Contreras 1/16/20171:25 PM

## 2016-01-01 NOTE — Discharge Summary (Signed)
[facility] NAME: Austin Contreras    MR#:  [id number]  DATE OF BIRTH:  08/05/1926  DATE OF ADMISSION:  12/22/2015 ADMITTING PHYSICIAN: Gladstone Lighter, MD  DATE OF DISCHARGE: 01/01/2016  PRIMARY CARE PHYSICIAN: [name], MD    ADMISSION DIAGNOSIS:  Acute blood loss anemia [D62] Hematuria [R31.9]  DISCHARGE DIAGNOSIS:  Active Problems:   Hematuria   Acute blood loss anemia   Attention to nephrostomy [facility])   SECONDARY DIAGNOSIS:   Past Medical History  Diagnosis Date  . B12 deficiency anemia 08/02/2015  . Bladder cancer ([place])     stage 2 s/p TUBTR  . GERD (gastroesophageal reflux disease)   . Hypertension   . PONV (postoperative nausea and vomiting)   . Hyperlipidemia   . Coronary artery disease   . H/O aortic valve replacement with porcine valve   . BPH (benign prostatic hyperplasia)   . A-fib ([place])   . SVT (supraventricular tachycardia) ([place])   . CKD (chronic kidney disease) stage 3, GFR 30-59 ml/min     HOSPITAL COURSE:    82 year old male with a history of radiation proctitis and radiation cystitis with muscle invasive bladder cancer who presents with hematuria.  1. Hematuria: Patient had CBI and 3-way Foley catheter placed. This was discontinued January14th. Patient has improved symptoms as far as pain. Patient now has bilateral nephrostomy tubes which are draining clear urine. If he has severe abdominal pain/bladder pain then as per urology consultation we should order a bladder ultrasound to evaluate for clot burden.  Patient had nephrostomy tubes placed on 12/28/2015 by IR. Patient will need follow-up with nephrology/urology and a change in his nephrostomy tubes every month. Patient will need dressing change daily and 10 cc flush with normal saline twice a day inject only. Home health care will be arranged at discharge. Physical therapy was recommending home health with PT.  2. Acute blood  loss anemia: This is from hematuria. Patient is status post 1 unit PRBCs. Hemoglobin stable. Patient does follow up at the cancer center for B12 injections.  3. Atrial fibrillation: Continue sotalol. Patient is not on anticoagulation due to history of GI bleed and hematuria.  4. Essential hypertension: Blood pressure is acceptable.   5. Acute on chronic renal failure stage III: Renal ultrasound showedbilateral hydronephrosis. Creatinine has greatly improved with the placement of bilateral nephrostomy tubes and he is essentially back to his baseline.  6. Bladder spasm: Continue B and O suppositories when necessary  7. Hypoxia: Chest x-ray shows resolving interstitial edema with small bilateral pleural effusions. Hypoxia has resolved   DISCHARGE CONDITIONS AND DIET:   Stable condition on a heart healthy diet  CONSULTS OBTAINED:  Treatment Team:  Royston Cowper, MD Murlean Iba, MD  DRUG ALLERGIES:   Allergies  Allergen Reactions  . Penicillins Rash and Other (See Comments)    Has patient had a PCN reaction causing immediate rash, facial/tongue/throat swelling, SOB or lightheadedness with hypotension: No Has patient had a PCN reaction causing severe rash involving mucus membranes or skin necrosis: No Has patient had a PCN reaction that required hospitalization No Has patient had a PCN reaction occurring within the last 10 years: No If all of the above answers are "NO", then may proceed with Cephalosporin use.    DISCHARGE MEDICATIONS:   Current Discharge Medication List    START taking these medications   Details  feeding supplement, ENSURE ENLIVE, (ENSURE ENLIVE) LIQD Take 237 mLs by  mouth 2 (two) times daily between meals. Qty: 237 mL, Refills: 12    opium-belladonna (B&O SUPPRETTES) 16.2-60 MG suppository Place 1 suppository rectally every 6 (six) hours as needed for bladder spasms. Qty: 30 suppository, Refills: 0    polyethylene glycol (MIRALAX / GLYCOLAX) packet  Take 17 g by mouth daily. Qty: 14 each, Refills: 0      CONTINUE these medications which have CHANGED   Details  acetaminophen-codeine (TYLENOL #3) 300-30 MG tablet Take 1 tablet by mouth every 4 (four) hours as needed for moderate pain. Qty: 30 tablet, Refills: 0      CONTINUE these medications which have NOT CHANGED   Details  cyanocobalamin (,VITAMIN B-12,) 1000 MCG/ML injection Inject 1,000 mcg into the muscle every 30 (thirty) days.    docusate sodium (COLACE) 100 MG capsule Take 2 capsules (200 mg total) by mouth 2 (two) times daily. Qty: 120 capsule, Refills: 3    enalapril (VASOTEC) 5 MG tablet Take 5 mg by mouth daily.     hydrocortisone (ANUSOL-HC) 25 MG suppository Place 25 mg rectally 2 (two) times daily as needed for hemorrhoids.    omeprazole (PRILOSEC) 20 MG capsule Take 20 mg by mouth daily.    simvastatin (ZOCOR) 10 MG tablet Take 10 mg by mouth at bedtime.     sotalol (BETAPACE) 80 MG tablet Take 80 mg by mouth 2 (two) times daily.       STOP taking these medications     Methen-Hyosc-Meth Blue-Na Phos (UROGESIC-BLUE) 81.6 MG TABS               Today   CHIEF COMPLAINT:  Patient is doing well this morning. Family is concerned about discharge plan. Physical therapy did recommend home with home health care. Patient without pain this morning and ambulated with PT.   VITAL SIGNS:  Blood pressure 148/79, pulse 68, temperature 98.2 F (36.8 C), temperature source Oral, resp. rate 20, height 5\' 10"  (1.778 m), weight 77.565 kg (171 lb), SpO2 95 %.   REVIEW OF SYSTEMS:  Review of Systems  Constitutional: Negative for fever, chills and malaise/fatigue.  HENT: Negative for sore throat.   Eyes: Negative for blurred vision.  Respiratory: Negative for cough, hemoptysis, shortness of breath and wheezing.   Cardiovascular: Negative for chest pain, palpitations and leg swelling.  Gastrointestinal: Negative for nausea, vomiting, abdominal pain, diarrhea and  blood in stool.  Genitourinary: Negative for dysuria.  Musculoskeletal: Negative for back pain.  Neurological: Negative for dizziness, tremors and headaches.  Endo/Heme/Allergies: Does not bruise/bleed easily.     PHYSICAL EXAMINATION:  GENERAL:  82 y.o.-year-old patient lying in the bed with no acute distress.  NECK:  Supple, no jugular venous distention. No thyroid enlargement, no tenderness.  LUNGS: Normal breath sounds bilaterally, no wheezing, rales,rhonchi  No use of accessory muscles of respiration.  CARDIOVASCULAR: S1, S2 normal. No murmurs, rubs, or gallops.  ABDOMEN: Soft, non-tender, non-distended. Bowel sounds present. No organomegaly or mass.  EXTREMITIES: No pedal edema, cyanosis, or clubbing.  PSYCHIATRIC: The patient is alert and oriented x 3.  SKIN: No obvious rash, lesion, or ulcer.   DATA REVIEW:   CBC  Recent Labs Lab 12/31/15 0441  WBC 5.3  HGB 8.3*  HCT 24.7*  PLT 261    Chemistries   Recent Labs Lab 12/27/15 0610  01/01/16 0519  NA 138  < > 139  K 5.6*  < > 4.7  CL 109  < > 111  CO2 18*  < >  21*  GLUCOSE 98  < > 106*  BUN 65*  < > 23*  CREATININE 4.29*  < > 1.54*  CALCIUM 8.5*  < > 7.9*  AST 19  --   --   ALT 32  --   --   ALKPHOS 81  --   --   BILITOT 2.5*  --   --   < > = values in this interval not displayed.  Cardiac Enzymes No results for input(s): TROPONINI in the last 168 hours.  Microbiology Results  @[email or web address] @  RADIOLOGY:  No results found.    Management plans discussed with the patient and he is in agreement. Stable for discharge   Patient should follow up with Dr Erlene Quan in 1 week  CODE STATUS:     Code Status Orders        Start     Ordered   12/22/15 1742  Full code   Continuous     12/22/15 1741    Code Status History    Date Active Date Inactive Code Status Order ID Comments User Context   This patient has a current code status but no historical code status.      TOTAL TIME TAKING CARE OF THIS  PATIENT: 45 minutes.    Note: This dictation was prepared with Dragon dictation along with smaller phrase technology. Any transcriptional errors that result from this process are unintentional.  Reesha Debes M.D on 01/01/2016 at 10:44 AM  Between 7am to 6pm - Pager - [phone] After 6pm go to [email or web address] - password [email or web address] [facility]  Office  [phone]  CC: Primary care physician; [name], MD

## 2016-01-01 NOTE — Care Management (Signed)
Plan for patient to be discharged today.  Daughters and wife at bedside. Patient and family had previously selected [facility], confirmed that this is still their wishes.  Corene Cornea from [facility] notified of pending discharge. Initially family requested hospital bed, BSC, and rolling walker.  After further discussion patient already has a rolling walker in the room.  Family and patient discussed the [facility] and hospital bed and decided that they no longer wanted this equipment delivered to the home.  Bedside nurse to instruct family on nephrostomy tube care prior to discharge.  Order for home health to initiate 01/01/15.  RNCM signing off

## 2016-01-03 ENCOUNTER — Other Ambulatory Visit: Payer: Self-pay

## 2016-01-03 DIAGNOSIS — T83092A Other mechanical complication of nephrostomy catheter, initial encounter: Secondary | ICD-10-CM

## 2016-01-03 MED ORDER — NORMAL SALINE FLUSH 0.9 % IV SOLN: 10.0000 mL | Freq: Four times a day (QID) | INTRAVENOUS | Status: DC

## 2016-01-11 ENCOUNTER — Telehealth: Payer: Self-pay

## 2016-01-11 NOTE — Telephone Encounter (Signed)
Left message for [name] (caregiver [phone]) to call back.  Per Dr. Erlene Quan ok to give orders to home health on nephrostomy tube.  1. Hematuria: Patient had CBI and 3-way Foley catheter placed. This was discontinued January14th. Patient has improved symptoms as far as pain. Patient now has bilateral nephrostomy tubes which are draining clear urine. If he has severe abdominal pain/bladder pain then as per urology consultation we should order a bladder ultrasound to evaluate for clot burden.  Patient had nephrostomy tubes placed on 12/28/2015 by IR. Patient will need follow-up with nephrology/urology and a change in his nephrostomy tubes every month. Patient will need dressing change daily and 10 cc flush with normal saline twice a day inject only. Home health care will be arranged at discharge. Physical therapy was recommending home health with PT.

## 2016-01-11 NOTE — Telephone Encounter (Signed)
Spoke with [name] and per Dr. Erlene Quan gave her a verbal order to flush with 10cc normal saline once daily and dressing change PRN if soiled. [name] verbalize understanding, she was told patient was to follow up with [place] next week

## 2016-01-16 ENCOUNTER — Ambulatory Visit (INDEPENDENT_AMBULATORY_CARE_PROVIDER_SITE_OTHER): Payer: Medicare Other | Admitting: Urology

## 2016-01-16 ENCOUNTER — Encounter: Payer: Self-pay | Admitting: Urology

## 2016-01-16 VITALS — BP 136/76 | HR 81 | Ht 70.0 in | Wt 163.7 lb

## 2016-01-16 DIAGNOSIS — N189 Chronic kidney disease, unspecified: Secondary | ICD-10-CM | POA: Diagnosis not present

## 2016-01-16 DIAGNOSIS — C678 Malignant neoplasm of overlapping sites of bladder: Secondary | ICD-10-CM

## 2016-01-16 DIAGNOSIS — R7989 Other specified abnormal findings of blood chemistry: Secondary | ICD-10-CM

## 2016-01-16 DIAGNOSIS — N3040 Irradiation cystitis without hematuria: Secondary | ICD-10-CM

## 2016-01-16 DIAGNOSIS — R945 Abnormal results of liver function studies: Secondary | ICD-10-CM

## 2016-01-16 DIAGNOSIS — N179 Acute kidney failure, unspecified: Secondary | ICD-10-CM

## 2016-01-16 HISTORY — PX: IR GENERIC HISTORICAL: IMG1180011

## 2016-01-16 NOTE — Progress Notes (Signed)
01/16/2016 12:20 PM   Levora Angel March 19, 1926 [id number]  Referring provider: Kirk Ruths, MD [facility] [place], [place] [address]  Chief Complaint  Patient presents with  . Hematuria    New Patient  . Hospitalization Follow-up    HPI: 82 year old male with a history of muscle invasive bladder cancer status post radiation who was recently admitted to [facility] with renal failure, severe radiation cystitis. He was ultimately underwent bilateral percutaneous nephrostomy tube placement and was discharged. He would like to transfer his care to [facility].  His wife presents with him to clinic today.  Bladder cancer Diagnosed with muscle invasive bladder cancer in March 2015. He underwent bladder sparing external beam radiation protocol by Dr. Baruch Gouty in July 2015.  He was also followed by Dr. Inez Pilgrim in the cancer center but was not offered chemotherapy.  He had a recurrence noted on surveillance cystoscopy s/p repeat TURBT by Dr. Yves Dill on 10/17/15 c/w CIS with early papillary change.  No BCG therapy to date.  No current restaging imaging.  Elevated LFTs and T bili noted on 12/20/2015.  Radiation cystitis  Chronic recurrent gross hematuria since November 2016. Admitted to the hospital in early January with refractory bleeding.  Underwent cystoscopy, clot a pack by Dr. Eliberto Ivory on 12/23/2015 noted to have small capacity 50 cc bladder with severe radiation cystitis.  Required blood transfusion.  Acute on chronic renal failure Developed acute on chronic renal failure with a creatinine of 3.71 during his admission with evidence of bilateral hydronephrosis.  Underwent placement of bilateral percutaneous nephrostomy tubes for management of radiation cystitis, small capacity bladder. Baseline creatinine 1.7.  Most recent creatinine on 01/01/2016 1.54 following nephrostomy tube placement.  PMH: Past Medical History  Diagnosis Date  . B12  deficiency anemia 08/02/2015  . Bladder cancer ([place])     stage 2 s/p TUBTR  . GERD (gastroesophageal reflux disease)   . Hypertension   . PONV (postoperative nausea and vomiting)   . Hyperlipidemia   . Coronary artery disease   . H/O aortic valve replacement with porcine valve   . BPH (benign prostatic hyperplasia)   . A-fib ([place])   . SVT (supraventricular tachycardia) ([place])   . CKD (chronic kidney disease) stage 3, GFR 30-59 ml/min     Surgical History: Past Surgical History  Procedure Laterality Date  . Appendectomy    . Coronary artery bypass graft    . Cataracts    . Cataract extraction, bilateral    . Transurethral resection of bladder tumor N/A 10/17/2015    Procedure: TRANSURETHRAL RESECTION OF BLADDER TUMOR (TURBT);  Surgeon: Royston Cowper, MD;  Location: [facility] ORS;  Service: Urology;  Laterality: N/A;  . Aortic valve replacement    . Pacemaker insertion    . Hand surgery      after trauma  . Rotator cuff repair      right shoulder  . Prepatellar bursa excision    . Cystoscopy with fulgeration Bilateral 12/23/2015    Procedure: CYSTOSCOPY WITH FULGERATION;  Surgeon: Royston Cowper, MD;  Location: [facility] ORS;  Service: Urology;  Laterality: Bilateral;    Home Medications:    Medication List       This list is accurate as of: 01/16/16 12:20 PM.  Always use your most recent med list.               acetaminophen-codeine 300-30 MG tablet  Commonly known as:  TYLENOL #3  Take 1 tablet by mouth every 4 (four) hours as needed for moderate pain.     cyanocobalamin 1000 MCG/ML injection  Commonly known as:  (VITAMIN B-12)  Inject 1,000 mcg into the muscle every 30 (thirty) days.     docusate sodium 100 MG capsule  Commonly known as:  COLACE  Take 2 capsules (200 mg total) by mouth 2 (two) times daily.     enalapril 5 MG tablet  Commonly known as:  VASOTEC  Take 5 mg by mouth daily.     Normal Saline Flush 0.9 % Soln  Inject 10 mLs into the vein QID.      omeprazole 20 MG capsule  Commonly known as:  PRILOSEC  Take 20 mg by mouth daily.     simvastatin 10 MG tablet  Commonly known as:  ZOCOR  Take 10 mg by mouth at bedtime.     sotalol 80 MG tablet  Commonly known as:  BETAPACE  Take 80 mg by mouth 2 (two) times daily.        Allergies:  Allergies  Allergen Reactions  . Penicillins Rash and Other (See Comments)    Has patient had a PCN reaction causing immediate rash, facial/tongue/throat swelling, SOB or lightheadedness with hypotension: No Has patient had a PCN reaction causing severe rash involving mucus membranes or skin necrosis: No Has patient had a PCN reaction that required hospitalization No Has patient had a PCN reaction occurring within the last 10 years: No If all of the above answers are "NO", then may proceed with Cephalosporin use.  [place] Flu Virus Vaccine Other (See Comments)    Times 3    Family History: Family History  Problem Relation Age of Onset  . Lymphoma Sister   . Bone cancer Mother   . CAD Father   . CVA Father   . Prostate cancer Neg Hx   . Bladder Cancer Neg Hx   . Kidney cancer Neg Hx     Social History:  reports that he has quit smoking. His smoking use included Cigarettes. He has never used smokeless tobacco. He reports that he does not drink alcohol or use illicit drugs.  ROS: UROLOGY Frequent Urination?: No Hard to postpone urination?: No Burning/pain with urination?: No Get up at night to urinate?: No Leakage of urine?: Yes Urine stream starts and stops?: No Trouble starting stream?: No Do you have to strain to urinate?: No Blood in urine?: No Urinary tract infection?: No Sexually transmitted disease?: No Injury to kidneys or bladder?: No Painful intercourse?: No Weak stream?: No Erection problems?: No Penile pain?: No  Gastrointestinal Nausea?: No Vomiting?: No Indigestion/heartburn?: No Diarrhea?: No Constipation?: No  Constitutional Fever: No Night sweats?:  No Weight loss?: No Fatigue?: No  Skin Skin rash/lesions?: No Itching?: No  Eyes Blurred vision?: No Double vision?: No  Ears/Nose/Throat Sore throat?: No Sinus problems?: No  Hematologic/Lymphatic Swollen glands?: No Easy bruising?: No  Cardiovascular Leg swelling?: No Chest pain?: No  Respiratory Cough?: No Shortness of breath?: No  Endocrine Excessive thirst?: No  Musculoskeletal Back pain?: No Joint pain?: No  Neurological Headaches?: No Dizziness?: No  Psychologic Depression?: No Anxiety?: No  Physical Exam: BP 136/76 mmHg  Pulse 81  Ht 5' 10"  (1.778 m)  Wt 163 lb 11.2 oz (74.254 kg)  BMI 23.49 kg/m2  Constitutional:  Alert and oriented, No acute distress.  Elderly.  Hard of hearing.  Wife present today. HEENT: [place] AT, moist mucus membranes.  Trachea midline, no masses.  Cardiovascular: No clubbing, cyanosis, or edema. Respiratory: Normal respiratory effort, no increased work of breathing. GI: Abdomen is soft, nontender, nondistended, no abdominal masses GU: No CVA tenderness. Bilateral nephrostomy dressings clean and dry. Tubes draining clear yellow urine bilaterally with scant debris. Skin: No rashes, bruises or suspicious lesions. Lymph: No cervical or inguinal adenopathy. Neurologic: Grossly intact, no focal deficits, moving all 4 extremities. Psychiatric: Normal mood and affect.  Laboratory Data: Lab Results  Component Value Date   WBC 5.3 12/31/2015   HGB 8.3* 12/31/2015   HCT 24.7* 12/31/2015   MCV 92.9 12/31/2015   PLT 261 12/31/2015    Lab Results  Component Value Date   CREATININE 1.54* 01/01/2016    Comprehensive Metabolic Panel (CMP) (86/76/1950 11:09 AM) Comprehensive Metabolic Panel (CMP) (93/26/7124 11:09 AM)  Component Value Ref Range  Glucose 107 70 - 110 mg/dL  Sodium 142 136 - 145 mmol/L  Potassium 4.1 3.6 - 5.1 mmol/L  Chloride 104 97 - 109 mmol/L  Carbon Dioxide (CO2) 25.8 22.0 - 32.0 mmol/L  Urea Nitrogen  (BUN) 36 (H) 7 - 25 mg/dL  Creatinine 2.6 (H) 0.7 - 1.3 mg/dL  Glomerular Filtration Rate (eGFR), MDRD Estimate 23 (L) >60 mL/min/1.73sq m  Calcium 8.9 8.7 - 10.3 mg/dL  AST  321 (H) 8 - 39 U/L  ALT  260 (H) 6 - 57 U/L  Alk Phos (alkaline Phosphatase) 94 34 - 104 U/L  Albumin 3.9 3.5 - 4.8 g/dL  Bilirubin, Total 3.9 (H) 0.3 - 1.2 mg/dL  Protein, Total 6.6 6.1 - 7.9 g/dL  A/G Ratio 1.4 1.0 - 5.0 gm/dL    Pertinent Imaging: No recent cross sectional imaging.  1. Malignant neoplasm of overlapping sites of bladder ([place]) History of muscle invasive bladder cancer status post radiation only. 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 We discussed possible options including BCG therapy to the bladder versus observation. We have a lengthy discussion today about goals of care. The patient at this point would like to know if he has any evidence of disease outside of his bladder prior to electing any further treatment. Will arrange for cross-sectional imaging.  - CT Abdomen Pelvis Wo Contrast; Future - IR Nephrostomy Tube Change; Future  2. Radiation cystitis Currently managed with bilateral nephrostomy tubes. Tolerating tube as well without any complaints. May consider hyperbaric oxygen in the future if develops recurrent bladder bleeding despite urinary diversion. Discussed nephrostomy tube care today.  Continue to flush daily with 10 cc of sterile saline. Change dressings as needed. Will arrange for exchange of nephrostomy tubes at 1 month interval.  3. Acute on chronic renal failure (H[facility]) Creatinine back to baseline with placement of bilateral nephrostomy tubes.  4. Elevated LFTs Not yet evaluated, concerning for possible metastatic disease to the liver versus GI pathology. We will proceed with staging CT abdomen pelvis.  Return in about 2 weeks (around 01/30/2016) for f/u CT scan.  Hollice Espy, MD  [facility] [address] [place], [place]  [address] [phone]  I spent 45 min with this patient of which greater than 50% was spent in counseling and coordination of care with the patient.

## 2016-01-18 ENCOUNTER — Ambulatory Visit: Payer: Medicare Other

## 2016-01-18 ENCOUNTER — Telehealth: Payer: Self-pay | Admitting: Radiology

## 2016-01-18 NOTE — Telephone Encounter (Signed)
Notified pt's wife, Mardene Celeste, of nephrostomy tube exchange scheduled for 02/02/16, pt needs to arrive at 8:00 in the [facility] & be npo after mn day of procedure. Wife voices understanding.

## 2016-01-26 ENCOUNTER — Ambulatory Visit
Admission: RE | Admit: 2016-01-26 | Discharge: 2016-01-26 | Disposition: A | Discharge status: Home / Self Care | Payer: Medicare Other | Source: Ambulatory Visit | Attending: Urology | Admitting: Urology

## 2016-01-26 DIAGNOSIS — R7989 Other specified abnormal findings of blood chemistry: Secondary | ICD-10-CM | POA: Diagnosis not present

## 2016-01-26 DIAGNOSIS — N200 Calculus of kidney: Secondary | ICD-10-CM | POA: Diagnosis not present

## 2016-01-26 DIAGNOSIS — K449 Diaphragmatic hernia without obstruction or gangrene: Secondary | ICD-10-CM | POA: Diagnosis not present

## 2016-01-26 DIAGNOSIS — K8020 Calculus of gallbladder without cholecystitis without obstruction: Secondary | ICD-10-CM | POA: Diagnosis not present

## 2016-01-26 DIAGNOSIS — N400 Enlarged prostate without lower urinary tract symptoms: Secondary | ICD-10-CM | POA: Diagnosis not present

## 2016-01-26 DIAGNOSIS — J90 Pleural effusion, not elsewhere classified: Secondary | ICD-10-CM | POA: Diagnosis not present

## 2016-01-26 DIAGNOSIS — N3040 Irradiation cystitis without hematuria: Secondary | ICD-10-CM | POA: Diagnosis not present

## 2016-01-26 DIAGNOSIS — Z923 Personal history of irradiation: Secondary | ICD-10-CM | POA: Diagnosis not present

## 2016-01-26 DIAGNOSIS — C678 Malignant neoplasm of overlapping sites of bladder: Secondary | ICD-10-CM | POA: Diagnosis not present

## 2016-01-31 ENCOUNTER — Encounter: Payer: Self-pay | Admitting: Urology

## 2016-01-31 ENCOUNTER — Ambulatory Visit (INDEPENDENT_AMBULATORY_CARE_PROVIDER_SITE_OTHER): Payer: Medicare Other | Admitting: Urology

## 2016-01-31 VITALS — BP 146/78 | HR 84 | Ht 68.0 in | Wt 166.2 lb

## 2016-01-31 DIAGNOSIS — N3040 Irradiation cystitis without hematuria: Secondary | ICD-10-CM | POA: Diagnosis not present

## 2016-01-31 DIAGNOSIS — C678 Malignant neoplasm of overlapping sites of bladder: Secondary | ICD-10-CM | POA: Diagnosis not present

## 2016-01-31 DIAGNOSIS — N200 Calculus of kidney: Secondary | ICD-10-CM | POA: Diagnosis not present

## 2016-01-31 DIAGNOSIS — R945 Abnormal results of liver function studies: Secondary | ICD-10-CM

## 2016-01-31 DIAGNOSIS — N189 Chronic kidney disease, unspecified: Secondary | ICD-10-CM

## 2016-01-31 DIAGNOSIS — N179 Acute kidney failure, unspecified: Secondary | ICD-10-CM

## 2016-01-31 DIAGNOSIS — R7989 Other specified abnormal findings of blood chemistry: Secondary | ICD-10-CM | POA: Diagnosis not present

## 2016-01-31 NOTE — Progress Notes (Signed)
12:59 PM  01/31/2016  Austin Contreras 03-24-1926 [id number]  Referring provider: Kirk Ruths, MD [facility] [place], [place] [address]  Chief Complaint  Patient presents with  . Bladder Cancer    Ctscan results    HPI: 82 year old male with a history of muscle invasive bladder cancer status post radiation, ARF, managed with bilateral percutaneous nephrostomy tubes who presents today to review staging CT abd/ pelvis.    Bladder cancer Diagnosed with muscle invasive bladder cancer in March 2015. He underwent bladder sparing external beam radiation protocol by Dr. Baruch Gouty in July 2015.  He was also followed by Dr. Inez Pilgrim in the cancer center but was not offered chemotherapy.  He had a recurrence noted on surveillance cystoscopy s/p repeat TURBT by Dr. Yves Dill on 10/17/15 c/w CIS with early papillary change.  No BCG therapy to date.  No current restaging imaging.  Elevated LFTs and T bili noted on 12/20/2015.  Staging CT abd/ pelvis without contrast 01/26/16 shows no obvious metastatic diease.    Radiation cystitis  Chronic recurrent gross hematuria since November 2016. Admitted to the hospital in early January with refractory bleeding.  Underwent cystoscopy, clot a pack by Dr. Eliberto Ivory on 12/23/2015 noted to have small capacity 50 cc bladder with severe radiation cystitis.  Required blood transfusion.  Acute on chronic renal failure Developed acute on chronic renal failure with a creatinine of 3.71 during his admission with evidence of bilateral hydronephrosis.  Underwent placement of bilateral percutaneous nephrostomy tubes for management of radiation cystitis, small capacity bladder. Baseline creatinine 1.7.  Most recent creatinine on 01/01/2016 1.54 following nephrostomy tube placement.  Scheduled for nephrostomy tube exchange this Friday, draining well.  Voids several tablespoons daily per urethra.  PMH: Past Medical History  Diagnosis Date  . B12  deficiency anemia 08/02/2015  . Bladder cancer ([place])     stage 2 s/p TUBTR  . GERD (gastroesophageal reflux disease)   . Hypertension   . PONV (postoperative nausea and vomiting)   . Hyperlipidemia   . Coronary artery disease   . H/O aortic valve replacement with porcine valve   . BPH (benign prostatic hyperplasia)   . A-fib ([place])   . SVT (supraventricular tachycardia) ([place])   . CKD (chronic kidney disease) stage 3, GFR 30-59 ml/min     Surgical History: Past Surgical History  Procedure Laterality Date  . Appendectomy    . Coronary artery bypass graft    . Cataracts    . Cataract extraction, bilateral    . Transurethral resection of bladder tumor N/A 10/17/2015    Procedure: TRANSURETHRAL RESECTION OF BLADDER TUMOR (TURBT);  Surgeon: Royston Cowper, MD;  Location: [facility] ORS;  Service: Urology;  Laterality: N/A;  . Aortic valve replacement    . Pacemaker insertion    . Hand surgery      after trauma  . Rotator cuff repair      right shoulder  . Prepatellar bursa excision    . Cystoscopy with fulgeration Bilateral 12/23/2015    Procedure: CYSTOSCOPY WITH FULGERATION;  Surgeon: Royston Cowper, MD;  Location: [facility] ORS;  Service: Urology;  Laterality: Bilateral;    Home Medications:    Medication List       This list is accurate as of: 01/31/16 12:59 PM.  Always use your most recent med list.               acetaminophen-codeine 300-30 MG tablet  Commonly known  as:  TYLENOL #3  Take 1 tablet by mouth every 4 (four) hours as needed for moderate pain.     cyanocobalamin 1000 MCG/ML injection  Commonly known as:  (VITAMIN B-12)  Inject 1,000 mcg into the muscle every 30 (thirty) days.     docusate sodium 100 MG capsule  Commonly known as:  COLACE  Take 2 capsules (200 mg total) by mouth 2 (two) times daily.     enalapril 5 MG tablet  Commonly known as:  VASOTEC  Take 5 mg by mouth daily.     Normal Saline Flush 0.9 % Soln  Inject 10 mLs into the vein QID.      simvastatin 10 MG tablet  Commonly known as:  ZOCOR  Take 10 mg by mouth at bedtime.     sotalol 80 MG tablet  Commonly known as:  BETAPACE  Take 80 mg by mouth 2 (two) times daily.        Allergies:  Allergies  Allergen Reactions  . Penicillins Rash and Other (See Comments)    Has patient had a PCN reaction causing immediate rash, facial/tongue/throat swelling, SOB or lightheadedness with hypotension: No Has patient had a PCN reaction causing severe rash involving mucus membranes or skin necrosis: No Has patient had a PCN reaction that required hospitalization No Has patient had a PCN reaction occurring within the last 10 years: No If all of the above answers are "NO", then may proceed with Cephalosporin use.  Marland Kitchen Flu Virus Vaccine Other (See Comments)    Times 3    Family History: Family History  Problem Relation Age of Onset  . Lymphoma Sister   . Bone cancer Mother   . CAD Father   . CVA Father   . Prostate cancer Neg Hx   . Bladder Cancer Neg Hx   . Kidney cancer Neg Hx     Social History:  reports that he has quit smoking. His smoking use included Cigarettes. He has never used smokeless tobacco. He reports that he does not drink alcohol or use illicit drugs.  ROS: UROLOGY Frequent Urination?: No Hard to postpone urination?: No Burning/pain with urination?: No Get up at night to urinate?: No Leakage of urine?: No Urine stream starts and stops?: No Trouble starting stream?: No Do you have to strain to urinate?: No Blood in urine?: No Urinary tract infection?: No Sexually transmitted disease?: No Injury to kidneys or bladder?: No Painful intercourse?: No Weak stream?: No Erection problems?: No Penile pain?: No  Gastrointestinal Nausea?: No Vomiting?: No Indigestion/heartburn?: No Diarrhea?: No Constipation?: No  Constitutional Fever: No Night sweats?: No Weight loss?: No Fatigue?: No  Skin Skin rash/lesions?: No Itching?: No  Eyes Blurred  vision?: No Double vision?: No  Ears/Nose/Throat Sore throat?: No Sinus problems?: No  Hematologic/Lymphatic Swollen glands?: No Easy bruising?: No  Cardiovascular Leg swelling?: No Chest pain?: No  Respiratory Cough?: No Shortness of breath?: No  Endocrine Excessive thirst?: No  Musculoskeletal Back pain?: No Joint pain?: No  Neurological Headaches?: No Dizziness?: No  Psychologic Depression?: No Anxiety?: No  Physical Exam: BP 146/78 mmHg  Pulse 84  Ht 5' 8" (1.727 m)  Wt 166 lb 3.2 oz (75.388 kg)  BMI 25.28 kg/m2  Constitutional:  Alert and oriented, No acute distress.  Elderly.  Hard of hearing.  Wife present today, daughter. HEENT: [place] AT, moist mucus membranes.  Trachea midline, no masses. Cardiovascular: No clubbing, cyanosis, or edema. Respiratory: Normal respiratory effort, no increased work of breathing.  Lymph: No cervical or inguinal adenopathy. Neurologic: Grossly intact, no focal deficits, moving all 4 extremities. Psychiatric: Normal mood and affect.  Laboratory Data: Lab Results  Component Value Date   WBC 5.3 12/31/2015   HGB 8.3* 12/31/2015   HCT 24.7* 12/31/2015   MCV 92.9 12/31/2015   PLT 261 12/31/2015    Lab Results  Component Value Date   CREATININE 1.54* 01/01/2016    Comprehensive Metabolic Panel (CMP) (23/30/0762 11:09 AM) Comprehensive Metabolic Panel (CMP) (26/33/3545 11:09 AM)  Component Value Ref Range  Glucose 107 70 - 110 mg/dL  Sodium 142 136 - 145 mmol/L  Potassium 4.1 3.6 - 5.1 mmol/L  Chloride 104 97 - 109 mmol/L  Carbon Dioxide (CO2) 25.8 22.0 - 32.0 mmol/L  Urea Nitrogen (BUN) 36 (H) 7 - 25 mg/dL  Creatinine 2.6 (H) 0.7 - 1.3 mg/dL  Glomerular Filtration Rate (eGFR), MDRD Estimate 23 (L) >60 mL/min/1.73sq m  Calcium 8.9 8.7 - 10.3 mg/dL  AST  321 (H) 8 - 39 U/L  ALT  260 (H) 6 - 57 U/L  Alk Phos (alkaline Phosphatase) 94 34 - 104 U/L  Albumin 3.9 3.5 - 4.8 g/dL  Bilirubin, Total 3.9 (H) 0.3 - 1.2 mg/dL    Protein, Total 6.6 6.1 - 7.9 g/dL  A/G Ratio 1.4 1.0 - 5.0 gm/dL    Pertinent Imaging: Study Result     CLINICAL DATA: Muscle invasive bladder cancer, status post radiation therapy. Renal failure. Radiation cystitis. Percutaneous nephrostomy tube placement bilaterally. Elevated liver function tests.  EXAM: CT ABDOMEN AND PELVIS WITHOUT CONTRAST  TECHNIQUE: Multidetector CT imaging of the abdomen and pelvis was performed following the standard protocol without IV contrast.  COMPARISON: 12/26/2015 renal ultrasound. CT of 03/10/2014.  FINDINGS: Lower chest: Bibasilar interstitial thickening is mild and could relate to prior infection or inflammation. Incompletely imaged pacer. Small hiatal hernia. Tiny bilateral pleural effusions.  Hepatobiliary: Focal steatosis adjacent the falciform ligament. Multiple gallstones without acute cholecystitis or biliary duct dilatation.  Pancreas: Normal pancreas for age, without duct dilatation or dominant mass.  Spleen: Normal in size, without focal abnormality.  Adrenals/Urinary Tract: Normal adrenal glands. Age expected renal cortical thinning. Bilateral percutaneous nephrostomy catheters, terminating in the renal pelves. No hydronephrosis. Lower pole left renal stones measure up to 7 mm. No hydroureter or ureteric calculi.  Moderate diffuse bladder wall thickening and calcification. No gross pericystic spread of disease.  Stomach/Bowel: Normal remainder of the stomach. Extensive colonic diverticulosis. Sigmoid wall thickening, likely related to muscular hypertrophy. Normal terminal ileum. Normal small bowel.  Vascular/Lymphatic: Aortic and branch vessel atherosclerosis. No abdominopelvic adenopathy.  Reproductive: Moderate to marked prostatomegaly.  Other: No significant free fluid. Fat containing left inguinal and ventral abdominal wall hernias.  Musculoskeletal: Degenerate disc disease at L4-5 and L5-S1.  Trace L4-5 anterolisthesis. Right-sided lower lumbar laminectomies.  IMPRESSION: 1. Bilateral nephrostomy catheters in place, without hydronephrosis or hydroureter. Left-sided nephrolithiasis. 2. Bladder wall thickening and calcification which could be due to malignancy and/or prior radiation. No gross transmural disease and no evidence of metastatic disease. 3. Small bilateral pleural effusions. 4. Small hiatal hernia. 5. Cholelithiasis. No other explanation for elevated liver function tests. 6. Prostatomegaly.   Electronically Signed  By: Abigail Miyamoto M.D.  On: 01/26/2016 11:44     1. Malignant neoplasm of overlapping sites of bladder [facility]) History of muscle invasive bladder cancer status post radiation only. 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 We discussed possible options including BCG therapy to the bladder versus observation. CT scan reviewed,  no evidence of obvious metastatic diease on nonontrast CT ad/ pelvis.  Discussed possibility today of BCG x 6 into bladder for treatment of CIS including risk which including hematuria, worsening bladder irritation, inability to tolerate or hold medication in bladder, and BCG sepsis.  All of his questions were answered.  We also discussed the natural history of bladder cancer/ CIS and progression.  He will consider all of his options and will let us know if he would like to pursue BCG.   -RTC 6 weeks or sooner if elects to procede with BCG  2. Radiation cystitis Currently managed with bilateral nephrostomy tubes. Tolerating tube as well without any complaints. May consider hyperbaric oxygen in the future if develops recurrent bladder bleeding despite urinary diversion. Discussed nephrostomy tube care today.  Continue to flush daily with 10 cc of sterile saline. Change dressings as needed. Will arrange for exchange of nephrostomy tubes, next exchange scheduled this Friday.  3. Acute on chronic renal failure (HCC) Creatinine back to  baseline with placement of bilateral nephrostomy tubes.  4. Elevated LFTs Recheck CMP today.  Referral to GI if remains elevated.    5. Left nephrolithiasis Asymptomatic.  No intervention recommended given age and comborbities.      Return in about 6 weeks (around 03/13/2016) for f/u bladder cancer/ hematuria.  Hollice Espy, MD  [facility] [address], [place], [place] [address] [phone]  I spent 25 min with this patient of which greater than 50% was spent in counseling and coordination of care with the patient.

## 2016-02-01 LAB — COMPREHENSIVE METABOLIC PANEL
ALK PHOS: 86 IU/L (ref 39–117)
ALT: 10 IU/L (ref 0–44)
AST: 11 IU/L (ref 0–40)
Albumin/Globulin Ratio: 1.5 (ref 1.1–2.5)
Albumin: 3.8 g/dL (ref 3.5–4.7)
BUN/Creatinine Ratio: 12 (ref 10–22)
BUN: 19 mg/dL (ref 8–27)
Bilirubin Total: 0.4 mg/dL (ref 0.0–1.2)
CO2: 21 mmol/L (ref 18–29)
CREATININE: 1.58 mg/dL — AB (ref 0.76–1.27)
Calcium: 9.1 mg/dL (ref 8.6–10.2)
Chloride: 102 mmol/L (ref 96–106)
GFR calc Af Amer: 45 mL/min/{1.73_m2} — ABNORMAL LOW (ref >59)
GFR calc non Af Amer: 39 mL/min/{1.73_m2} — ABNORMAL LOW (ref >59)
GLUCOSE: 90 mg/dL (ref 65–99)
Globulin, Total: 2.6 g/dL (ref 1.5–4.5)
Potassium: 5.3 mmol/L — ABNORMAL HIGH (ref 3.5–5.2)
SODIUM: 140 mmol/L (ref 134–144)
Total Protein: 6.4 g/dL (ref 6.0–8.5)

## 2016-02-02 ENCOUNTER — Ambulatory Visit
Admission: RE | Admit: 2016-02-02 | Discharge: 2016-02-02 | Disposition: A | Discharge status: Home / Self Care | Payer: Medicare Other | Source: Ambulatory Visit | Attending: Urology | Admitting: Urology

## 2016-02-02 DIAGNOSIS — C678 Malignant neoplasm of overlapping sites of bladder: Secondary | ICD-10-CM

## 2016-02-02 DIAGNOSIS — C679 Malignant neoplasm of bladder, unspecified: Secondary | ICD-10-CM | POA: Diagnosis not present

## 2016-02-02 DIAGNOSIS — N183 Chronic kidney disease, stage 3 (moderate): Secondary | ICD-10-CM | POA: Diagnosis not present

## 2016-02-02 DIAGNOSIS — N179 Acute kidney failure, unspecified: Secondary | ICD-10-CM | POA: Diagnosis not present

## 2016-02-02 DIAGNOSIS — Z823 Family history of stroke: Secondary | ICD-10-CM | POA: Diagnosis not present

## 2016-02-02 DIAGNOSIS — K219 Gastro-esophageal reflux disease without esophagitis: Secondary | ICD-10-CM | POA: Diagnosis not present

## 2016-02-02 DIAGNOSIS — Z807 Family history of other malignant neoplasms of lymphoid, hematopoietic and related tissues: Secondary | ICD-10-CM | POA: Insufficient documentation

## 2016-02-02 DIAGNOSIS — R310 Gross hematuria: Secondary | ICD-10-CM | POA: Diagnosis not present

## 2016-02-02 DIAGNOSIS — Z79899 Other long term (current) drug therapy: Secondary | ICD-10-CM | POA: Diagnosis not present

## 2016-02-02 DIAGNOSIS — N400 Enlarged prostate without lower urinary tract symptoms: Secondary | ICD-10-CM | POA: Diagnosis not present

## 2016-02-02 DIAGNOSIS — Z8249 Family history of ischemic heart disease and other diseases of the circulatory system: Secondary | ICD-10-CM | POA: Insufficient documentation

## 2016-02-02 DIAGNOSIS — Z887 Allergy status to serum and vaccine status: Secondary | ICD-10-CM | POA: Diagnosis not present

## 2016-02-02 DIAGNOSIS — N1330 Unspecified hydronephrosis: Secondary | ICD-10-CM | POA: Insufficient documentation

## 2016-02-02 DIAGNOSIS — I129 Hypertensive chronic kidney disease with stage 1 through stage 4 chronic kidney disease, or unspecified chronic kidney disease: Secondary | ICD-10-CM | POA: Insufficient documentation

## 2016-02-02 DIAGNOSIS — I2510 Atherosclerotic heart disease of native coronary artery without angina pectoris: Secondary | ICD-10-CM | POA: Diagnosis not present

## 2016-02-02 DIAGNOSIS — I4891 Unspecified atrial fibrillation: Secondary | ICD-10-CM | POA: Diagnosis not present

## 2016-02-02 DIAGNOSIS — Z952 Presence of prosthetic heart valve: Secondary | ICD-10-CM | POA: Insufficient documentation

## 2016-02-02 DIAGNOSIS — Z951 Presence of aortocoronary bypass graft: Secondary | ICD-10-CM | POA: Insufficient documentation

## 2016-02-02 DIAGNOSIS — Z87891 Personal history of nicotine dependence: Secondary | ICD-10-CM | POA: Diagnosis not present

## 2016-02-02 DIAGNOSIS — Z9842 Cataract extraction status, left eye: Secondary | ICD-10-CM | POA: Diagnosis not present

## 2016-02-02 DIAGNOSIS — Z9841 Cataract extraction status, right eye: Secondary | ICD-10-CM | POA: Diagnosis not present

## 2016-02-02 DIAGNOSIS — Z808 Family history of malignant neoplasm of other organs or systems: Secondary | ICD-10-CM | POA: Diagnosis not present

## 2016-02-02 DIAGNOSIS — Z880 Allergy status to penicillin: Secondary | ICD-10-CM | POA: Diagnosis not present

## 2016-02-02 MED ORDER — SODIUM CHLORIDE 0.9 % IV SOLN: INTRAVENOUS | Status: DC

## 2016-02-02 MED ORDER — IOHEXOL 300 MG/ML  SOLN
50.0000 mL | Freq: Once | INTRAMUSCULAR | Status: AC | PRN
  Administered 2016-02-02: 20 mL

## 2016-02-02 NOTE — Procedures (Signed)
percutaneous nephrostomy exchange under fluoro, bilateral No complication No blood loss. See complete dictation in [facility].

## 2016-03-13 ENCOUNTER — Ambulatory Visit (INDEPENDENT_AMBULATORY_CARE_PROVIDER_SITE_OTHER): Payer: Medicare Other | Admitting: Urology

## 2016-03-13 ENCOUNTER — Encounter: Payer: Self-pay | Admitting: Urology

## 2016-03-13 ENCOUNTER — Ambulatory Visit: Payer: Medicare Other | Admitting: Urology

## 2016-03-13 VITALS — BP 125/71 | HR 87 | Ht 68.0 in | Wt 166.0 lb

## 2016-03-13 DIAGNOSIS — N3040 Irradiation cystitis without hematuria: Secondary | ICD-10-CM | POA: Diagnosis not present

## 2016-03-13 DIAGNOSIS — N179 Acute kidney failure, unspecified: Secondary | ICD-10-CM | POA: Diagnosis not present

## 2016-03-13 DIAGNOSIS — N189 Chronic kidney disease, unspecified: Secondary | ICD-10-CM

## 2016-03-13 DIAGNOSIS — C679 Malignant neoplasm of bladder, unspecified: Secondary | ICD-10-CM | POA: Diagnosis not present

## 2016-03-13 DIAGNOSIS — N200 Calculus of kidney: Secondary | ICD-10-CM

## 2016-03-13 NOTE — Progress Notes (Signed)
2:50 PM  03/13/2016  Levora Angel 1926/05/05 [id number]  Referring provider: Kirk Ruths, MD [facility] [place], [place] [address]  Chief Complaint  Patient presents with  . Bladder Cancer    6wk    HPI: 82 year old male with a history of muscle invasive bladder cancer status post radiation, ARF, managed with bilateral percutaneous nephrostomy tubes who returns to arrange nephrostomy tube exchange.    Bladder cancer Diagnosed with muscle invasive bladder cancer in March 2015. He underwent bladder sparing external beam radiation protocol by Dr. Baruch Gouty in July 2015.  He was also followed by Dr. Inez Pilgrim in the cancer center but was not offered chemotherapy.  He had a recurrence noted on surveillance cystoscopy s/p repeat TURBT by Dr. Yves Dill on 10/17/15 c/w CIS with early papillary change.  No BCG therapy to date.  No current restaging imaging. Staging CT abd/ pelvis without contrast 01/26/16 shows no obvious metastatic diease.  Considering but currently declining BCG.  Overall feeling well.    Radiation cystitis  Chronic recurrent gross hematuria since November 2016. Admitted to the hospital in early January with refractory bleeding.  Underwent cystoscopy, clot a pack by Dr. Eliberto Ivory on 12/23/2015 noted to have small capacity 50 cc bladder with severe radiation cystitis.  Required blood transfusion.   Resolved with bilateral nephrostomy tube placement.   Complaining of pain from stat locks.    Acute on chronic renal failure Developed acute on chronic renal failure with a creatinine of 3.71 during his admission with evidence of bilateral hydronephrosis.  Underwent placement of bilateral percutaneous nephrostomy tubes for management of radiation cystitis, small capacity bladder. Baseline creatinine 1.7.  Most recent creatinine on 1.58 on 01/31/16 following nephrostomy tube placement.  He does void small amounts per urethral several times a day of various  colors.    PMH: Past Medical History  Diagnosis Date  . B12 deficiency anemia 08/02/2015  . Bladder cancer ([place])     stage 2 s/p TUBTR  . GERD (gastroesophageal reflux disease)   . Hypertension   . PONV (postoperative nausea and vomiting)   . Hyperlipidemia   . Coronary artery disease   . H/O aortic valve replacement with porcine valve   . BPH (benign prostatic hyperplasia)   . A-fib ([place])   . SVT (supraventricular tachycardia) ([place])   . CKD (chronic kidney disease) stage 3, GFR 30-59 ml/min     Surgical History: Past Surgical History  Procedure Laterality Date  . Appendectomy    . Coronary artery bypass graft    . Cataracts    . Cataract extraction, bilateral    . Transurethral resection of bladder tumor N/A 10/17/2015    Procedure: TRANSURETHRAL RESECTION OF BLADDER TUMOR (TURBT);  Surgeon: Royston Cowper, MD;  Location: [facility] ORS;  Service: Urology;  Laterality: N/A;  . Aortic valve replacement    . Pacemaker insertion    . Hand surgery      after trauma  . Rotator cuff repair      right shoulder  . Prepatellar bursa excision    . Cystoscopy with fulgeration Bilateral 12/23/2015    Procedure: CYSTOSCOPY WITH FULGERATION;  Surgeon: Royston Cowper, MD;  Location: [facility] ORS;  Service: Urology;  Laterality: Bilateral;    Home Medications:    Medication List       This list is accurate as of: 03/13/16  2:50 PM.  Always use your most recent med list.  acetaminophen-codeine 300-30 MG tablet  Commonly known as:  TYLENOL #3  Take 1 tablet by mouth every 4 (four) hours as needed for moderate pain.     cyanocobalamin 1000 MCG/ML injection  Commonly known as:  (VITAMIN B-12)  Inject 1,000 mcg into the muscle every 30 (thirty) days.     docusate sodium 100 MG capsule  Commonly known as:  COLACE  Take 2 capsules (200 mg total) by mouth 2 (two) times daily.     enalapril 5 MG tablet  Commonly known as:  VASOTEC  Take 5 mg by mouth daily.     Normal  Saline Flush 0.9 % Soln  Inject 10 mLs into the vein QID.     simvastatin 10 MG tablet  Commonly known as:  ZOCOR  Take 10 mg by mouth at bedtime.     sotalol 80 MG tablet  Commonly known as:  BETAPACE  Take 80 mg by mouth 2 (two) times daily.        Allergies:  Allergies  Allergen Reactions  . Penicillins Rash and Other (See Comments)    Has patient had a PCN reaction causing immediate rash, facial/tongue/throat swelling, SOB or lightheadedness with hypotension: No Has patient had a PCN reaction causing severe rash involving mucus membranes or skin necrosis: No Has patient had a PCN reaction that required hospitalization No Has patient had a PCN reaction occurring within the last 10 years: No If all of the above answers are "NO", then may proceed with Cephalosporin use.  Marland Kitchen Flu Virus Vaccine Other (See Comments)    Times 3    Family History: Family History  Problem Relation Age of Onset  . Lymphoma Sister   . Bone cancer Mother   . CAD Father   . CVA Father   . Prostate cancer Neg Hx   . Bladder Cancer Neg Hx   . Kidney cancer Neg Hx     Social History:  reports that he has quit smoking. His smoking use included Cigarettes. He has never used smokeless tobacco. He reports that he does not drink alcohol or use illicit drugs.  ROS: UROLOGY Frequent Urination?: No Hard to postpone urination?: No Burning/pain with urination?: No Get up at night to urinate?: No Leakage of urine?: No Urine stream starts and stops?: No Trouble starting stream?: No Do you have to strain to urinate?: No Blood in urine?: No Urinary tract infection?: No Sexually transmitted disease?: No Injury to kidneys or bladder?: No Painful intercourse?: No Weak stream?: No Erection problems?: No Penile pain?: No  Gastrointestinal Nausea?: No Vomiting?: No Indigestion/heartburn?: No Diarrhea?: No Constipation?: No  Constitutional Fever: No Night sweats?: No Weight loss?: No Fatigue?:  No  Skin Skin rash/lesions?: No Itching?: No  Eyes Blurred vision?: No Double vision?: No  Ears/Nose/Throat Sore throat?: No Sinus problems?: No  Hematologic/Lymphatic Swollen glands?: No Easy bruising?: No  Cardiovascular Leg swelling?: No Chest pain?: No  Respiratory Cough?: No Shortness of breath?: Yes  Endocrine Excessive thirst?: No  Musculoskeletal Back pain?: No Joint pain?: No  Neurological Headaches?: No Dizziness?: No  Psychologic Depression?: No Anxiety?: No  Physical Exam: BP 125/71 mmHg  Pulse 87  Ht _0  (1.727 m)  Wt 166 lb (75.297 kg)  BMI 25.25 kg/m2  Constitutional:  Alert and oriented, No acute distress.  Elderly.  Hard of hearing.  Wife present today, daughter. HEENT: [place] AT, moist mucus membranes.  Trachea midline, no masses. Cardiovascular: No clubbing, cyanosis, or edema. Respiratory: Normal respiratory effort,  no increased work of breathing. Flank: Bilateral nephrostomy tubes draining well, sites c/d/i. Lymph: No cervical or inguinal adenopathy. Neurologic: Grossly intact, no focal deficits, moving all 4 extremities. Psychiatric: Normal mood and affect.  Laboratory Data: Lab Results  Component Value Date   WBC 5.3 12/31/2015   HGB 8.3* 12/31/2015   HCT 24.7* 12/31/2015   MCV 92.9 12/31/2015   PLT 261 12/31/2015    Lab Results  Component Value Date   CREATININE 1.58* 01/31/2016    Comprehensive Metabolic Panel (CMP) (48/18/5631 11:09 AM) Comprehensive Metabolic Panel (CMP) (49/70/2637 11:09 AM)  Component Value Ref Range  Glucose 107 70 - 110 mg/dL  Sodium 142 136 - 145 mmol/L  Potassium 4.1 3.6 - 5.1 mmol/L  Chloride 104 97 - 109 mmol/L  Carbon Dioxide (CO2) 25.8 22.0 - 32.0 mmol/L  Urea Nitrogen (BUN) 36 (H) 7 - 25 mg/dL  Creatinine 2.6 (H) 0.7 - 1.3 mg/dL  Glomerular Filtration Rate (eGFR), MDRD Estimate 23 (L) >60 mL/min/1.73sq m  Calcium 8.9 8.7 - 10.3 mg/dL  AST  321 (H) 8 - 39 U/L  ALT  260 (H) 6 - 57  U/L  Alk Phos (alkaline Phosphatase) 94 34 - 104 U/L  Albumin 3.9 3.5 - 4.8 g/dL  Bilirubin, Total 3.9 (H) 0.3 - 1.2 mg/dL  Protein, Total 6.6 6.1 - 7.9 g/dL  A/G Ratio 1.4 1.0 - 5.0 gm/dL    Pertinent Imaging: Study Result     CLINICAL DATA: Muscle invasive bladder cancer, status post radiation therapy. Renal failure. Radiation cystitis. Percutaneous nephrostomy tube placement bilaterally. Elevated liver function tests.  EXAM: CT ABDOMEN AND PELVIS WITHOUT CONTRAST  TECHNIQUE: Multidetector CT imaging of the abdomen and pelvis was performed following the standard protocol without IV contrast.  COMPARISON: 12/26/2015 renal ultrasound. CT of 03/10/2014.  FINDINGS: Lower chest: Bibasilar interstitial thickening is mild and could relate to prior infection or inflammation. Incompletely imaged pacer. Small hiatal hernia. Tiny bilateral pleural effusions.  Hepatobiliary: Focal steatosis adjacent the falciform ligament. Multiple gallstones without acute cholecystitis or biliary duct dilatation.  Pancreas: Normal pancreas for age, without duct dilatation or dominant mass.  Spleen: Normal in size, without focal abnormality.  Adrenals/Urinary Tract: Normal adrenal glands. Age expected renal cortical thinning. Bilateral percutaneous nephrostomy catheters, terminating in the renal pelves. No hydronephrosis. Lower pole left renal stones measure up to 7 mm. No hydroureter or ureteric calculi.  Moderate diffuse bladder wall thickening and calcification. No gross pericystic spread of disease.  Stomach/Bowel: Normal remainder of the stomach. Extensive colonic diverticulosis. Sigmoid wall thickening, likely related to muscular hypertrophy. Normal terminal ileum. Normal small bowel.  Vascular/Lymphatic: Aortic and branch vessel atherosclerosis. No abdominopelvic adenopathy.  Reproductive: Moderate to marked prostatomegaly.  Other: No significant free fluid. Fat  containing left inguinal and ventral abdominal wall hernias.  Musculoskeletal: Degenerate disc disease at L4-5 and L5-S1. Trace L4-5 anterolisthesis. Right-sided lower lumbar laminectomies.  IMPRESSION: 1. Bilateral nephrostomy catheters in place, without hydronephrosis or hydroureter. Left-sided nephrolithiasis. 2. Bladder wall thickening and calcification which could be due to malignancy and/or prior radiation. No gross transmural disease and no evidence of metastatic disease. 3. Small bilateral pleural effusions. 4. Small hiatal hernia. 5. Cholelithiasis. No other explanation for elevated liver function tests. 6. Prostatomegaly.   Electronically Signed  By: Abigail Miyamoto M.D.  On: 01/26/2016 11:44     1. Malignant neoplasm of overlapping sites of bladder [facility]) History of muscle invasive bladder cancer status post radiation only. Recurrence with CIS, not treated.  CT scan reviewed,  no evidence of obvious metastatic diease on nonontrast CT ad/ pelvis 2/17.  We again today discussed possible options including BCG therapy to the bladder versus observation.   Declined BCG today out of concern for recurrent bleeding.   - RTC in 6 weeks  2. Radiation cystitis Currently managed with bilateral nephrostomy tubes. Tolerating tube as well without any complaints. May consider hyperbaric oxygen in the future if develops recurrent bladder bleeding despite urinary diversion. Reviewed nephrostomy tube care today.  Continue to flush daily with 10 cc of sterile saline. Change dressings as needed. Will arrange for exchange of nephrostomy tubes  3. Acute on chronic renal failure (HCC) Creatinine back to baseline with placement of bilateral nephrostomy tubes.  4. Left nephrolithiasis Asymptomatic.  No intervention recommended given age and comborbities.      Hollice Espy, MD  [facility] [address] [place], [place] [address] [phone]  Will arrange for subsequent nephrostomy tube exchange over the phone.    F/u in 3 months

## 2016-03-19 ENCOUNTER — Telehealth: Payer: Self-pay | Admitting: Urology

## 2016-03-19 NOTE — Telephone Encounter (Signed)
We need an order for the

## 2016-03-20 ENCOUNTER — Telehealth: Payer: Self-pay | Admitting: Radiology

## 2016-03-20 NOTE — Telephone Encounter (Signed)
Notified pt's wife of nephrostomy tube exchange scheduled for 03/29/16. Pt is to arrive at the [facility] at 8:00am, be npo after mn, have a driver, and take enalapril and sotalol with a sip of water the morning of the procedure. Wife voices understanding.

## 2016-03-28 ENCOUNTER — Ambulatory Visit
Admission: RE | Admit: 2016-03-28 | Discharge: 2016-03-28 | Disposition: A | Discharge status: Home / Self Care | Payer: Medicare Other | Source: Ambulatory Visit | Attending: Urology | Admitting: Urology

## 2016-03-28 DIAGNOSIS — I471 Supraventricular tachycardia: Secondary | ICD-10-CM | POA: Diagnosis not present

## 2016-03-28 DIAGNOSIS — N200 Calculus of kidney: Secondary | ICD-10-CM | POA: Insufficient documentation

## 2016-03-28 DIAGNOSIS — Z807 Family history of other malignant neoplasms of lymphoid, hematopoietic and related tissues: Secondary | ICD-10-CM | POA: Insufficient documentation

## 2016-03-28 DIAGNOSIS — Z9842 Cataract extraction status, left eye: Secondary | ICD-10-CM | POA: Diagnosis not present

## 2016-03-28 DIAGNOSIS — Z9841 Cataract extraction status, right eye: Secondary | ICD-10-CM | POA: Diagnosis not present

## 2016-03-28 DIAGNOSIS — N3040 Irradiation cystitis without hematuria: Secondary | ICD-10-CM | POA: Diagnosis not present

## 2016-03-28 DIAGNOSIS — N179 Acute kidney failure, unspecified: Secondary | ICD-10-CM

## 2016-03-28 DIAGNOSIS — Z808 Family history of malignant neoplasm of other organs or systems: Secondary | ICD-10-CM | POA: Insufficient documentation

## 2016-03-28 DIAGNOSIS — Z823 Family history of stroke: Secondary | ICD-10-CM | POA: Diagnosis not present

## 2016-03-28 DIAGNOSIS — Z9049 Acquired absence of other specified parts of digestive tract: Secondary | ICD-10-CM | POA: Insufficient documentation

## 2016-03-28 DIAGNOSIS — Z87891 Personal history of nicotine dependence: Secondary | ICD-10-CM | POA: Insufficient documentation

## 2016-03-28 DIAGNOSIS — Z887 Allergy status to serum and vaccine status: Secondary | ICD-10-CM | POA: Insufficient documentation

## 2016-03-28 DIAGNOSIS — I129 Hypertensive chronic kidney disease with stage 1 through stage 4 chronic kidney disease, or unspecified chronic kidney disease: Secondary | ICD-10-CM | POA: Insufficient documentation

## 2016-03-28 DIAGNOSIS — Z953 Presence of xenogenic heart valve: Secondary | ICD-10-CM | POA: Diagnosis not present

## 2016-03-28 DIAGNOSIS — Z951 Presence of aortocoronary bypass graft: Secondary | ICD-10-CM | POA: Insufficient documentation

## 2016-03-28 DIAGNOSIS — I4891 Unspecified atrial fibrillation: Secondary | ICD-10-CM | POA: Insufficient documentation

## 2016-03-28 DIAGNOSIS — Z880 Allergy status to penicillin: Secondary | ICD-10-CM | POA: Insufficient documentation

## 2016-03-28 DIAGNOSIS — N183 Chronic kidney disease, stage 3 (moderate): Secondary | ICD-10-CM | POA: Insufficient documentation

## 2016-03-28 DIAGNOSIS — Z79899 Other long term (current) drug therapy: Secondary | ICD-10-CM | POA: Diagnosis not present

## 2016-03-28 DIAGNOSIS — Z950 Presence of cardiac pacemaker: Secondary | ICD-10-CM | POA: Insufficient documentation

## 2016-03-28 DIAGNOSIS — Z79891 Long term (current) use of opiate analgesic: Secondary | ICD-10-CM | POA: Insufficient documentation

## 2016-03-28 DIAGNOSIS — K219 Gastro-esophageal reflux disease without esophagitis: Secondary | ICD-10-CM | POA: Diagnosis not present

## 2016-03-28 DIAGNOSIS — Z436 Encounter for attention to other artificial openings of urinary tract: Secondary | ICD-10-CM | POA: Insufficient documentation

## 2016-03-28 DIAGNOSIS — C61 Malignant neoplasm of prostate: Secondary | ICD-10-CM | POA: Diagnosis not present

## 2016-03-28 DIAGNOSIS — Z8249 Family history of ischemic heart disease and other diseases of the circulatory system: Secondary | ICD-10-CM | POA: Insufficient documentation

## 2016-03-28 DIAGNOSIS — C679 Malignant neoplasm of bladder, unspecified: Secondary | ICD-10-CM

## 2016-03-28 DIAGNOSIS — I2510 Atherosclerotic heart disease of native coronary artery without angina pectoris: Secondary | ICD-10-CM | POA: Insufficient documentation

## 2016-03-28 DIAGNOSIS — E785 Hyperlipidemia, unspecified: Secondary | ICD-10-CM | POA: Diagnosis not present

## 2016-03-28 DIAGNOSIS — N189 Chronic kidney disease, unspecified: Secondary | ICD-10-CM

## 2016-03-28 DIAGNOSIS — Z9889 Other specified postprocedural states: Secondary | ICD-10-CM | POA: Insufficient documentation

## 2016-03-28 MED ORDER — IOPAMIDOL (ISOVUE-300) INJECTION 61%
30.0000 mL | Freq: Once | INTRAVENOUS | Status: AC | PRN
  Administered 2016-03-28: 10 mL

## 2016-03-28 MED ORDER — LIDOCAINE HCL (PF) 1 % IJ SOLN
INTRAMUSCULAR | Status: AC | PRN
  Administered 2016-03-28: 10 mL

## 2016-03-29 ENCOUNTER — Ambulatory Visit: Payer: Medicare Other

## 2016-04-03 DIAGNOSIS — Z0000 Encounter for general adult medical examination without abnormal findings: Secondary | ICD-10-CM | POA: Insufficient documentation

## 2016-04-12 ENCOUNTER — Ambulatory Visit: Payer: Self-pay | Admitting: Radiation Oncology

## 2016-05-07 NOTE — Telephone Encounter (Signed)
error 

## 2016-05-24 ENCOUNTER — Ambulatory Visit (INDEPENDENT_AMBULATORY_CARE_PROVIDER_SITE_OTHER): Payer: Medicare Other | Admitting: Urology

## 2016-05-24 ENCOUNTER — Encounter: Payer: Self-pay | Admitting: Urology

## 2016-05-24 VITALS — BP 115/83 | HR 69 | Ht 70.0 in | Wt 164.4 lb

## 2016-05-24 DIAGNOSIS — N183 Chronic kidney disease, stage 3 unspecified: Secondary | ICD-10-CM

## 2016-05-24 DIAGNOSIS — N3040 Irradiation cystitis without hematuria: Secondary | ICD-10-CM | POA: Diagnosis not present

## 2016-05-24 DIAGNOSIS — C679 Malignant neoplasm of bladder, unspecified: Secondary | ICD-10-CM

## 2016-05-24 DIAGNOSIS — N200 Calculus of kidney: Secondary | ICD-10-CM

## 2016-05-24 NOTE — Progress Notes (Signed)
4:22 PM  05/24/16   Austin Contreras 01/19/1926 [id number]  Referring provider: Kirk Ruths, MD [facility] [place], [place] [address]  Chief Complaint  Patient presents with  . Follow-up    bladder cancr     HPI: 82 year old male with a history of muscle invasive bladder cancer status post radiation managed with bilateral percutaneous nephrostomy tubes who returns to arrange nephrostomy tube exchange.    Bladder cancer Diagnosed with muscle invasive bladder cancer in March 2015. He underwent bladder sparing external beam radiation protocol by Dr. Baruch Gouty in July 2015.  He was also followed by Dr. Inez Pilgrim in the cancer center but was not offered chemotherapy.  He had a recurrence noted on surveillance cystoscopy s/p repeat TURBT by Dr. Yves Dill on 10/17/15 c/w CIS with early papillary change.  No BCG therapy to date.  No current restaging imaging. Staging CT abd/ pelvis without contrast 01/26/16 shows no obvious metastatic diease.  Considering but currently declining BCG.  Overall feeling well.    Radiation cystitis  Chronic recurrent gross hematuria since November 2016. Admitted to the hospital in early January 2017 refractory bleeding.  Underwent cystoscopy, clot a pack by Dr. Eliberto Ivory on 12/23/2015 noted to have small capacity 50 cc bladder with severe radiation cystitis.  Required blood transfusion.   Resolved with bilateral nephrostomy tube placement.  Last tube exchange 03/29/16, due for exchange.    Acute on chronic renal failure Developed acute on chronic renal failure with a creatinine of 3.71 during his admission with evidence of bilateral hydronephrosis.  Underwent placement of bilateral percutaneous nephrostomy tubes for management of radiation cystitis, small capacity bladder. Baseline creatinine 1.7.  Most recent creatinine on 1.7 on 03/2016 following nephrostomy tube placement.  He does void small amounts per urethral several times a day of various  colors.    PMH: Past Medical History  Diagnosis Date  . B12 deficiency anemia 08/02/2015  . Bladder cancer ([place])     stage 2 s/p TUBTR  . GERD (gastroesophageal reflux disease)   . Hypertension   . PONV (postoperative nausea and vomiting)   . Hyperlipidemia   . Coronary artery disease   . H/O aortic valve replacement with porcine valve   . BPH (benign prostatic hyperplasia)   . A-fib ([place])   . SVT (supraventricular tachycardia) ([place])   . CKD (chronic kidney disease) stage 3, GFR 30-59 ml/min     Surgical History: Past Surgical History  Procedure Laterality Date  . Appendectomy    . Coronary artery bypass graft    . Cataracts    . Cataract extraction, bilateral    . Transurethral resection of bladder tumor N/A 10/17/2015    Procedure: TRANSURETHRAL RESECTION OF BLADDER TUMOR (TURBT);  Surgeon: Royston Cowper, MD;  Location: [facility] ORS;  Service: Urology;  Laterality: N/A;  . Aortic valve replacement    . Pacemaker insertion    . Hand surgery      after trauma  . Rotator cuff repair      right shoulder  . Prepatellar bursa excision    . Cystoscopy with fulgeration Bilateral 12/23/2015    Procedure: CYSTOSCOPY WITH FULGERATION;  Surgeon: Royston Cowper, MD;  Location: [facility] ORS;  Service: Urology;  Laterality: Bilateral;    Home Medications:    Medication List       This list is accurate as of: 05/24/16 11:59 PM.  Always use your most recent med list.  acetaminophen-codeine 300-30 MG tablet  Commonly known as:  TYLENOL #3  Take 1 tablet by mouth every 4 (four) hours as needed for moderate pain.     cyanocobalamin 1000 MCG/ML injection  Commonly known as:  (VITAMIN B-12)  Inject 1,000 mcg into the muscle every 30 (thirty) days.     docusate sodium 100 MG capsule  Commonly known as:  COLACE  Take 2 capsules (200 mg total) by mouth 2 (two) times daily.     enalapril 5 MG tablet  Commonly known as:  VASOTEC  Take 5 mg by mouth daily.     furosemide 20  MG tablet  Commonly known as:  LASIX  Take by mouth.     hydrocortisone 25 MG suppository  Commonly known as:  ANUSOL-HC  Place rectally. Reported on 05/24/2016     Normal Saline Flush 0.9 % Soln  Inject 10 mLs into the vein QID.     simvastatin 10 MG tablet  Commonly known as:  ZOCOR  Take 10 mg by mouth at bedtime.     sotalol 80 MG tablet  Commonly known as:  BETAPACE  Take 80 mg by mouth 2 (two) times daily.     traZODone 50 MG tablet  Commonly known as:  DESYREL  Take by mouth. Reported on 05/24/2016        Allergies:  Allergies  Allergen Reactions  . Penicillins Rash and Other (See Comments)    Has patient had a PCN reaction causing immediate rash, facial/tongue/throat swelling, SOB or lightheadedness with hypotension: No Has patient had a PCN reaction causing severe rash involving mucus membranes or skin necrosis: No Has patient had a PCN reaction that required hospitalization No Has patient had a PCN reaction occurring within the last 10 years: No If all of the above answers are "NO", then may proceed with Cephalosporin use.  Marland Kitchen Flu Virus Vaccine Other (See Comments)    Times 3    Family History: Family History  Problem Relation Age of Onset  . Lymphoma Sister   . Bone cancer Mother   . CAD Father   . CVA Father   . Prostate cancer Neg Hx   . Bladder Cancer Neg Hx   . Kidney cancer Neg Hx     Social History:  reports that he has quit smoking. His smoking use included Cigarettes. He has never used smokeless tobacco. He reports that he does not drink alcohol or use illicit drugs.  Physical Exam: BP 115/83 mmHg  Pulse 69  Ht 5\' 10"  (1.778 m)  Wt 164 lb 6.4 oz (74.571 kg)  BMI 23.59 kg/m2  SpO2 100%  Constitutional:  Alert and oriented, No acute distress.  Elderly.  Hard of hearing.  Wife present today. HEENT: [place] AT, moist mucus membranes.  Trachea midline, no masses. Cardiovascular: No clubbing, cyanosis, or edema. Respiratory: Normal respiratory effort,  no increased work of breathing. Flank: Bilateral nephrostomy tubes draining well, sites c/d/i.Marland Kitchen Neurologic: Grossly intact, no focal deficits, moving all 4 extremities. Psychiatric: Normal mood and affect.  Laboratory Data: Lab Results  Component Value Date   WBC 5.3 12/31/2015   HGB 8.3* 12/31/2015   HCT 24.7* 12/31/2015   MCV 92.9 12/31/2015   PLT 261 [id number]   Basic Metabolic Panel (BMP) (A999333 8:48 AM) Basic Metabolic Panel (BMP) (A999333 8:48 AM)  Component Value Ref Range  Glucose 98 70 - 110 mg/dL  Sodium 142 136 - 145 mmol/L  Potassium 4.8 3.6 - 5.1 mmol/L  Chloride 109  97 - 109 mmol/L  Carbon Dioxide (CO2) 28.3 22.0 - 32.0 mmol/L  Calcium 9.1 8.7 - 10.3 mg/dL  Urea Nitrogen (BUN) 21 7 - 25 mg/dL      Pertinent Imaging: Study Result     CLINICAL DATA: Muscle invasive bladder cancer, status post radiation therapy. Renal failure. Radiation cystitis. Percutaneous nephrostomy tube placement bilaterally. Elevated liver function tests.  EXAM: CT ABDOMEN AND PELVIS WITHOUT CONTRAST  TECHNIQUE: Multidetector CT imaging of the abdomen and pelvis was performed following the standard protocol without IV contrast.  COMPARISON: 12/26/2015 renal ultrasound. CT of 03/10/2014.  FINDINGS: Lower chest: Bibasilar interstitial thickening is mild and could relate to prior infection or inflammation. Incompletely imaged pacer. Small hiatal hernia. Tiny bilateral pleural effusions.  Hepatobiliary: Focal steatosis adjacent the falciform ligament. Multiple gallstones without acute cholecystitis or biliary duct dilatation.  Pancreas: Normal pancreas for age, without duct dilatation or dominant mass.  Spleen: Normal in size, without focal abnormality.  Adrenals/Urinary Tract: Normal adrenal glands. Age expected renal cortical thinning. Bilateral percutaneous nephrostomy catheters, terminating in the renal pelves. No hydronephrosis. Lower pole  left renal stones measure up to 7 mm. No hydroureter or ureteric calculi.  Moderate diffuse bladder wall thickening and calcification. No gross pericystic spread of disease.  Stomach/Bowel: Normal remainder of the stomach. Extensive colonic diverticulosis. Sigmoid wall thickening, likely related to muscular hypertrophy. Normal terminal ileum. Normal small bowel.  Vascular/Lymphatic: Aortic and branch vessel atherosclerosis. No abdominopelvic adenopathy.  Reproductive: Moderate to marked prostatomegaly.  Other: No significant free fluid. Fat containing left inguinal and ventral abdominal wall hernias.  Musculoskeletal: Degenerate disc disease at L4-5 and L5-S1. Trace L4-5 anterolisthesis. Right-sided lower lumbar laminectomies.  IMPRESSION: 1. Bilateral nephrostomy catheters in place, without hydronephrosis or hydroureter. Left-sided nephrolithiasis. 2. Bladder wall thickening and calcification which could be due to malignancy and/or prior radiation. No gross transmural disease and no evidence of metastatic disease. 3. Small bilateral pleural effusions. 4. Small hiatal hernia. 5. Cholelithiasis. No other explanation for elevated liver function tests. 6. Prostatomegaly.   Electronically Signed  By: Abigail Miyamoto M.D.  On: 01/26/2016 11:44     1. Malignant neoplasm of overlapping sites of bladder [facility]) History of muscle invasive bladder cancer status post radiation only. Recurrence with CIS, not treated.  CT scan reviewed, no evidence of obvious metastatic diease on nonontrast CT ad/ pelvis 2/17.  Declined BCG therapy.    Lengthy discussion today about whether or not to pursue surveillance cystoscopy especially in the setting of untreated CIS diagnosed in 10/2015. Natural history of bladder cancer reviewed.  We discussed that if he is not to pursue further intervention with TURBT, there is no reason to proceed with cystoscopy. Alternatively, if he would like  information about his prognosis, recurrence and may consider additional local therapy, I have recommended proceeding with office cystoscopy. He is aware that this could certainly provoke an episode of bladder bleeding and may be uncomfortable.  Risk and benefits were discussed in detail. He is interested in having an office cystoscopy in the near future. We'll arrange for this following his nephrostomy tube exchange.   2. Radiation cystitis Currently managed with bilateral nephrostomy tubes Overdue for nephrostomy tube exchange Culture from nephrostomy tubes obtained today prior to manipulation, we'll treat as needed prior to tube exchange  3. Acute on chronic renal failure (HCC) Creatinine back to baseline with placement of bilateral nephrostomy tubes.  4. Left nephrolithiasis Asymptomatic.  No intervention recommended given age and comborbities.     Hollice Espy,  MD  [facility] [address] [place], [place] [address] [phone]   I spent 25 min with this patient of which greater than 50% was spent in counseling and coordination of care with the patient.

## 2016-05-27 ENCOUNTER — Encounter: Payer: Self-pay | Admitting: Urology

## 2016-05-27 ENCOUNTER — Telehealth: Payer: Self-pay | Admitting: Radiology

## 2016-05-27 NOTE — Telephone Encounter (Signed)
Notified pt's wife of bilateral nephrostomy tube exchange scheduled 06/07/16 with arrival time of 8:00 to the [place] at [facility]. Advised that pt should be npo after mn except for taking enalapril & sotalol with a sip of water the morning of the procedure & will need a driver upon discharge. Pt's wife voices understanding.

## 2016-05-28 ENCOUNTER — Telehealth: Payer: Self-pay | Admitting: Radiology

## 2016-05-28 ENCOUNTER — Other Ambulatory Visit: Payer: Self-pay

## 2016-05-28 DIAGNOSIS — N3040 Irradiation cystitis without hematuria: Secondary | ICD-10-CM

## 2016-05-28 LAB — CULTURE, URINE COMPREHENSIVE

## 2016-05-28 MED ORDER — CIPROFLOXACIN HCL 250 MG PO TABS: ORAL_TABLET | ORAL | Status: DC

## 2016-05-28 NOTE — Telephone Encounter (Signed)
-----   Message from Hollice Espy, MD sent at 05/28/2016  7:29 AM EDT ----- Please treat with cipro 250 mg bid prior to tube exchange starting 3 days before exchange and continue for a total of 7 days  Hollice Espy, MD

## 2016-05-28 NOTE — Telephone Encounter (Signed)
Notified pt's wife of positive ucx & prescription sent to pharmacy. Advised that pt should take cipro bid beginning 06/04/16 x 7 days. Wife voices understanding.

## 2016-05-31 ENCOUNTER — Telehealth: Payer: Self-pay

## 2016-05-31 NOTE — Telephone Encounter (Signed)
Pt pharmacy called stating they would not fill medication due to pt current heart medications. Per pharmacist the cipro interaction would be to the sotalol with a reaction of arrhthymias and QT elongation. Per Dr. Erlene Quan ok to fill medication. Made pharmacist aware.

## 2016-06-06 ENCOUNTER — Other Ambulatory Visit: Payer: Self-pay | Admitting: Radiology

## 2016-06-06 DIAGNOSIS — N3040 Irradiation cystitis without hematuria: Secondary | ICD-10-CM

## 2016-06-07 ENCOUNTER — Ambulatory Visit
Admission: RE | Admit: 2016-06-07 | Discharge: 2016-06-07 | Disposition: A | Discharge status: Home / Self Care | Payer: Medicare Other | Source: Ambulatory Visit | Attending: Urology | Admitting: Urology

## 2016-06-07 ENCOUNTER — Ambulatory Visit: Admission: RE | Admit: 2016-06-07 | Payer: Medicare Other | Source: Ambulatory Visit

## 2016-06-07 DIAGNOSIS — N183 Chronic kidney disease, stage 3 (moderate): Secondary | ICD-10-CM | POA: Diagnosis not present

## 2016-06-07 DIAGNOSIS — D519 Vitamin B12 deficiency anemia, unspecified: Secondary | ICD-10-CM | POA: Insufficient documentation

## 2016-06-07 DIAGNOSIS — K219 Gastro-esophageal reflux disease without esophagitis: Secondary | ICD-10-CM | POA: Insufficient documentation

## 2016-06-07 DIAGNOSIS — N3040 Irradiation cystitis without hematuria: Secondary | ICD-10-CM

## 2016-06-07 DIAGNOSIS — E785 Hyperlipidemia, unspecified: Secondary | ICD-10-CM | POA: Diagnosis not present

## 2016-06-07 DIAGNOSIS — Z953 Presence of xenogenic heart valve: Secondary | ICD-10-CM | POA: Insufficient documentation

## 2016-06-07 DIAGNOSIS — R0602 Shortness of breath: Secondary | ICD-10-CM | POA: Diagnosis not present

## 2016-06-07 DIAGNOSIS — Z466 Encounter for fitting and adjustment of urinary device: Secondary | ICD-10-CM | POA: Diagnosis present

## 2016-06-07 DIAGNOSIS — Z87891 Personal history of nicotine dependence: Secondary | ICD-10-CM | POA: Diagnosis not present

## 2016-06-07 DIAGNOSIS — I2510 Atherosclerotic heart disease of native coronary artery without angina pectoris: Secondary | ICD-10-CM | POA: Insufficient documentation

## 2016-06-07 DIAGNOSIS — Z923 Personal history of irradiation: Secondary | ICD-10-CM | POA: Insufficient documentation

## 2016-06-07 DIAGNOSIS — N400 Enlarged prostate without lower urinary tract symptoms: Secondary | ICD-10-CM | POA: Insufficient documentation

## 2016-06-07 DIAGNOSIS — I129 Hypertensive chronic kidney disease with stage 1 through stage 4 chronic kidney disease, or unspecified chronic kidney disease: Secondary | ICD-10-CM | POA: Diagnosis not present

## 2016-06-07 DIAGNOSIS — C679 Malignant neoplasm of bladder, unspecified: Secondary | ICD-10-CM | POA: Insufficient documentation

## 2016-06-07 DIAGNOSIS — Z880 Allergy status to penicillin: Secondary | ICD-10-CM | POA: Insufficient documentation

## 2016-06-07 DIAGNOSIS — I4891 Unspecified atrial fibrillation: Secondary | ICD-10-CM | POA: Insufficient documentation

## 2016-06-07 DIAGNOSIS — I471 Supraventricular tachycardia: Secondary | ICD-10-CM | POA: Insufficient documentation

## 2016-06-07 MED ORDER — FENTANYL CITRATE (PF) 100 MCG/2ML IJ SOLN
INTRAMUSCULAR | Status: AC | PRN
  Administered 2016-06-07: 12.5 ug via INTRAVENOUS

## 2016-06-07 MED ORDER — SODIUM CHLORIDE 0.9 % IV SOLN
INTRAVENOUS | Status: DC
  Administered 2016-06-07: 09:00:00 via INTRAVENOUS

## 2016-06-07 MED ORDER — IOPAMIDOL (ISOVUE-300) INJECTION 61%
30.0000 mL | Freq: Once | INTRAVENOUS | Status: AC | PRN
  Administered 2016-06-07: 20 mL via INTRAVENOUS

## 2016-06-07 MED ORDER — MIDAZOLAM HCL 5 MG/5ML IJ SOLN
INTRAMUSCULAR | Status: AC | PRN
  Administered 2016-06-07: 0.5 mg via INTRAVENOUS

## 2016-06-07 MED ORDER — LIDOCAINE HCL (PF) 1 % IJ SOLN
INTRAMUSCULAR | Status: DC | PRN
  Administered 2016-06-07: 10 mL

## 2016-06-07 NOTE — Procedures (Signed)
Successful exchange of bilateral nephrostomy tubes.  No immediate complication.

## 2016-06-07 NOTE — Progress Notes (Signed)
Chief Complaint: Patient was seen in consultation today for bilateral nephrostomy tube exchange at the request of [place]  Referring Physician(s): [place]   Patient Status: Outpatient  History of Present Illness: Austin Contreras is a 82 y.o. male with a history of muscle invasive bladder cancer status post radiation managed with bilateral percutaneous nephrostomy tubes.  Patient presents for routine tube exchange.  Overall, no problems with the tubes except for occasion pain with flushing of the right tube.  Flushing drains every other day.  He experienced some pain with the last nephrostomy exchange and has requested sedation for this procedure.  Complains of mild SOB and history of cardiac valve disease.  No chest pain.  No abdominal or GI problems. No fevers.  No chills.    Past Medical History  Diagnosis Date  . B12 deficiency anemia 08/02/2015  . Bladder cancer ([place])     stage 2 s/p TUBTR  . GERD (gastroesophageal reflux disease)   . Hypertension   . PONV (postoperative nausea and vomiting)   . Hyperlipidemia   . Coronary artery disease   . H/O aortic valve replacement with porcine valve   . BPH (benign prostatic hyperplasia)   . A-fib ([place])   . SVT (supraventricular tachycardia) ([place])   . CKD (chronic kidney disease) stage 3, GFR 30-59 ml/min     Past Surgical History  Procedure Laterality Date  . Appendectomy    . Coronary artery bypass graft    . Cataracts    . Cataract extraction, bilateral    . Transurethral resection of bladder tumor N/A 10/17/2015    Procedure: TRANSURETHRAL RESECTION OF BLADDER TUMOR (TURBT);  Surgeon: Royston Cowper, MD;  Location: [facility] ORS;  Service: Urology;  Laterality: N/A;  . Aortic valve replacement    . Pacemaker insertion    . Hand surgery      after trauma  . Rotator cuff repair      right shoulder  . Prepatellar bursa excision    . Cystoscopy with fulgeration Bilateral 12/23/2015    Procedure: CYSTOSCOPY WITH  FULGERATION;  Surgeon: Royston Cowper, MD;  Location: [facility] ORS;  Service: Urology;  Laterality: Bilateral;    Allergies: Penicillins and Flu virus vaccine  Medications: Prior to Admission medications   Medication Sig Start Date End Date Taking? Authorizing Provider  ciprofloxacin (CIPRO) 250 MG tablet Start medication on June 04, 2016 05/28/16  Yes Hollice Espy, MD  cyanocobalamin (,VITAMIN B-12,) 1000 MCG/ML injection Inject 1,000 mcg into the muscle every 30 (thirty) days.   Yes Historical Provider, MD  enalapril (VASOTEC) 5 MG tablet Take 5 mg by mouth daily.    Yes Historical Provider, MD  hydrocortisone (ANUSOL-HC) 25 MG suppository Place rectally. Reported on 05/24/2016 12/20/15  Yes Historical Provider, MD  simvastatin (ZOCOR) 10 MG tablet Take 10 mg by mouth at bedtime.    Yes Historical Provider, MD  Sodium Chloride Flush (NORMAL SALINE FLUSH) 0.9 % SOLN Inject 10 mLs into the vein QID. 01/03/16  Yes Hollice Espy, MD  sotalol (BETAPACE) 80 MG tablet Take 80 mg by mouth 2 (two) times daily.    Yes Historical Provider, MD  acetaminophen-codeine (TYLENOL #3) 300-30 MG tablet Take 1 tablet by mouth every 4 (four) hours as needed for moderate pain. Patient not taking: Reported on 05/24/2016 01/01/16   Bettey Costa, MD  docusate sodium (COLACE) 100 MG capsule Take 2 capsules (200 mg total) by mouth 2 (two) times daily. Patient not taking: Reported on 05/24/2016  10/17/15   Royston Cowper, MD  furosemide (LASIX) 20 MG tablet Take by mouth.    Historical Provider, MD  traZODone (DESYREL) 50 MG tablet Take by mouth. Reported on 06/07/2016 01/10/16 01/09/17  Historical Provider, MD     Family History  Problem Relation Age of Onset  . Lymphoma Sister   . Bone cancer Mother   . CAD Father   . CVA Father   . Prostate cancer Neg Hx   . Bladder Cancer Neg Hx   . Kidney cancer Neg Hx     Social History   Social History  . Marital Status: Married    Spouse Name: N/A  . Number of Children: N/A  .  Years of Education: N/A   Social History Main Topics  . Smoking status: Former Smoker    Types: Cigarettes  . Smokeless tobacco: Never Used     Comment: Quit in 1999  . Alcohol Use: No  . Drug Use: No  . Sexual Activity: Not Asked   Other Topics Concern  . None   Social History Narrative   Lives at home with wife, very independent. Active at baseline.      Review of Systems  Constitutional: Negative.   Respiratory: Positive for shortness of breath.   Cardiovascular: Negative for chest pain.  Gastrointestinal: Negative.   Genitourinary: Negative for hematuria.    Vital Signs: BP 128/76 mmHg  Pulse 74  Temp(Src) 98.1 F (36.7 C) (Oral)  Resp 20  SpO2 97%  Physical Exam  Cardiovascular: Normal rate and regular rhythm.   Systolic murmur  Pulmonary/Chest: Effort normal and breath sounds normal.  Abdominal: Soft. Bowel sounds are normal.  Both nephrostomy tubes are bandaged and intact.  Yellow urine in both bags.    Mallampati Score: 2     Imaging: No results found.  Labs:  CBC:  Recent Labs  12/28/15 0517 12/29/15 0634 12/30/15 0512 12/31/15 0441  WBC 6.3 4.6 4.7 5.3  HGB 8.1* 8.1* 8.4* 8.3*  HCT 24.9* 24.7* 26.0* 24.7*  PLT 241 240 271 261    COAGS:  Recent Labs  12/22/15 1055  INR 1.12  APTT 36    BMP:  Recent Labs  12/30/15 0512 12/31/15 0441 01/01/16 0519 01/31/16 1205  NA 141 140 139 140  K 4.7 4.5 4.7 5.3*  CL 114* 113* 111 102  CO2 19* 21* 21* 21  GLUCOSE 99 107* 106* 90  BUN 40* 29* 23* 19  CALCIUM 8.1* 7.9* 7.9* 9.1  CREATININE 2.01* 1.57* 1.54* 1.58*  GFRNONAA 28* 38* 39* 39*  GFRAA 33* 44* 45* 45*    LIVER FUNCTION TESTS:  Recent Labs  12/22/15 1055 12/27/15 0610 01/31/16 1205  BILITOT 1.7* 2.5* 0.4  AST 40 19 11  ALT 124* 32 10  ALKPHOS 104 81 86  PROT 6.9 6.6 6.4  ALBUMIN 3.7 2.9* 3.8    TUMOR MARKERS: No results for input(s): AFPTM, CEA, CA199, CHROMGRNA in the last 8760 hours.  Assessment and  Plan:  82 yo with bilateral nephrostomy tubes and scheduled for routine exchange.  Will provide moderate sedation for procedure by patient's request.  Risks of procedure discussed and informed consent obtained.    Thank you for this interesting consult.  I greatly enjoyed meeting Austin Contreras and look forward to participating in their care.  A copy of this report was sent to the requesting provider on this date.  Electronically Signed: Carylon Perches 06/07/2016, 9:09 AM   I spent  a total of  10 Minutes in face to face in clinical consultation, greater than 50% of which was counseling/coordinating care for nephrostomy tube care.

## 2016-07-03 ENCOUNTER — Ambulatory Visit (INDEPENDENT_AMBULATORY_CARE_PROVIDER_SITE_OTHER): Payer: Medicare Other | Admitting: Urology

## 2016-07-03 ENCOUNTER — Encounter: Payer: Self-pay | Admitting: Urology

## 2016-07-03 VITALS — BP 120/78 | HR 83 | Ht 70.0 in | Wt 165.2 lb

## 2016-07-03 DIAGNOSIS — N3040 Irradiation cystitis without hematuria: Secondary | ICD-10-CM

## 2016-07-03 DIAGNOSIS — C674 Malignant neoplasm of posterior wall of bladder: Secondary | ICD-10-CM

## 2016-07-03 MED ORDER — CIPROFLOXACIN HCL 500 MG PO TABS
500.0000 mg | ORAL_TABLET | Freq: Once | ORAL | Status: AC
  Administered 2016-07-03: 500 mg via ORAL

## 2016-07-03 MED ORDER — LIDOCAINE HCL 2 % EX GEL
1.0000 "application " | Freq: Once | CUTANEOUS | Status: AC
  Administered 2016-07-03: 1 via URETHRAL

## 2016-07-03 NOTE — Progress Notes (Signed)
07/03/2016     HPI: 82 year old male with a history of muscle invasive bladder cancer status post radiation only managed with bilateral percutaneous nephrostomy tubes.  Documented recurrence of CIS 11/16 --> untreated.    Cystoscopy Procedure Note  Patient identification was confirmed, informed consent was obtained, and patient was prepped using Betadine solution.  Lidocaine jelly was administered per urethral meatus.    Preoperative abx where received prior to procedure.     Pre-Procedure: - Inspection reveals a normal caliber ureteral meatus.  Procedure: The flexible cystoscope was introduced without difficulty - No urethral strictures/lesions are present. - Enlarged prostate with irregular mucosal surface with significant whitish adherent debris, punched out ulceration at dome, ~1 cm.   Papillary appearing tumor on right lateral wall of bladder.   - Normal bladder neck - Unable to appreciate UOs due to signficant debris - Small bladder capacity - No bladder stones  Cysto somewhat limited due to presence of debris  Post-Procedure: - Patient tolerated the procedure well   A/P:   1. Malignant neoplasm of posterior wall of urinary bladder ([place]) Findings highly concerning for progressive disease These were discussed today with the patient and his wife/ daughter We discussed the options of palliative TURBT versus continued observation with the understanding of likely disease progression He is not interested in returning to the operating room at this time Recommend repeat CT chest abdomen and pelvis without contrast at 6 month interval from previous scan (2/17) to assess for possible metastatic disease--> may consider palliative chemotherapy in the form of anti-PDL should disease progress outside of the bladder - ciprofloxacin (CIPRO) tablet 500 mg; Take 1 tablet (500 mg total) by mouth once. - lidocaine (XYLOCAINE) 2 % jelly 1 application; Place 1 application into the urethra  once.  2. Radiation cystitis Managed with bilateral nephrostomy tubes Did well with nephrostomy tube exchange over two-month interval, we will increase interval to every 3 months unless having issues with tube drainage or infections  Hollice Espy, MD

## 2016-07-05 ENCOUNTER — Other Ambulatory Visit: Payer: Self-pay | Admitting: Radiology

## 2016-07-05 DIAGNOSIS — N3040 Irradiation cystitis without hematuria: Secondary | ICD-10-CM

## 2016-07-08 ENCOUNTER — Telehealth: Payer: Self-pay | Admitting: Radiology

## 2016-07-08 NOTE — Telephone Encounter (Signed)
Notified pt's wife of bilateral nephrostomy tube exchange scheduled 08/28/16 with arrival time to [facility] at 12:30, pt should be npo after mn, have a driver & take enalapril & sotalol with only a sip of water. Wife voices understanding.

## 2016-07-19 IMAGING — CR DG CHEST 2V
1 series · 2 of 2 positions shown · non-contrast
Comparison: Portable chest x-ray December 24, 2015

CLINICAL DATA: History of bladder malignancy, now with hematuria;
hypoxia, hypo name training RUDI, gradually progressive anemia and
fatigue over the past several weeks

EXAM:
CHEST  2 VIEW

[Series 1: x chest ap · 0.14mm/px · 2 of 2 slices shown]
[im 1/2]
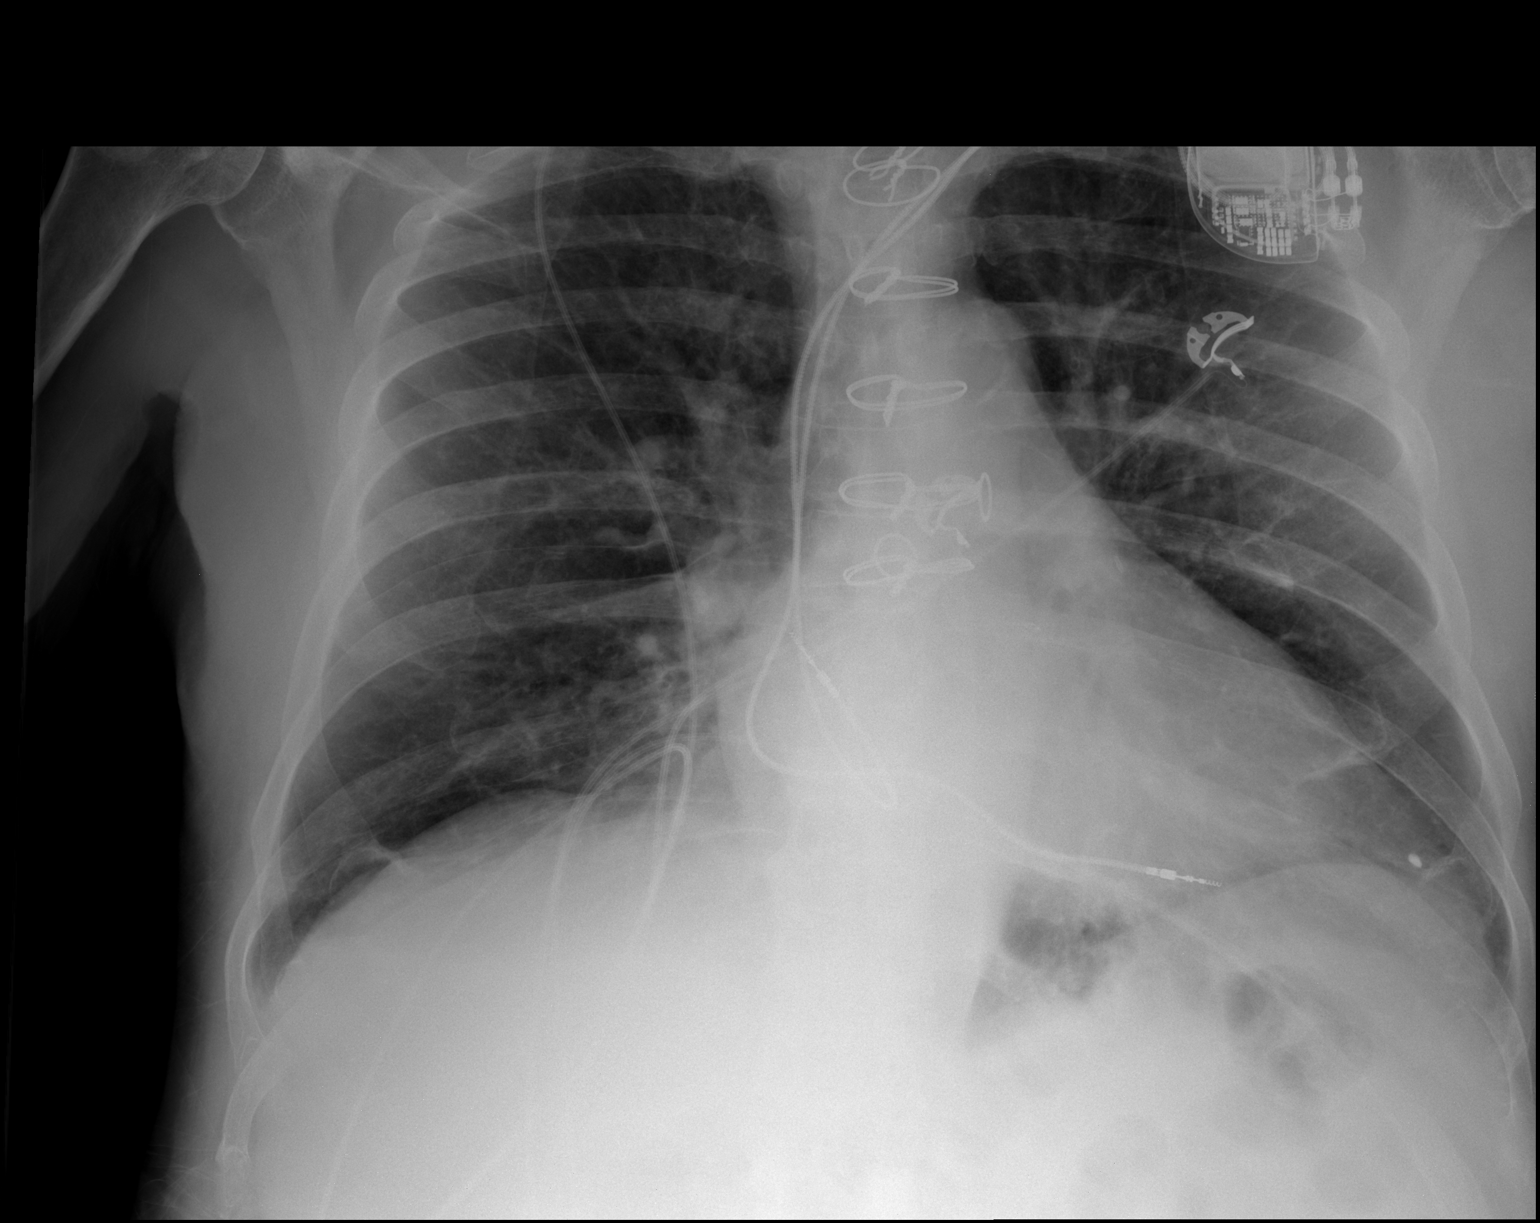
[im 2/2]
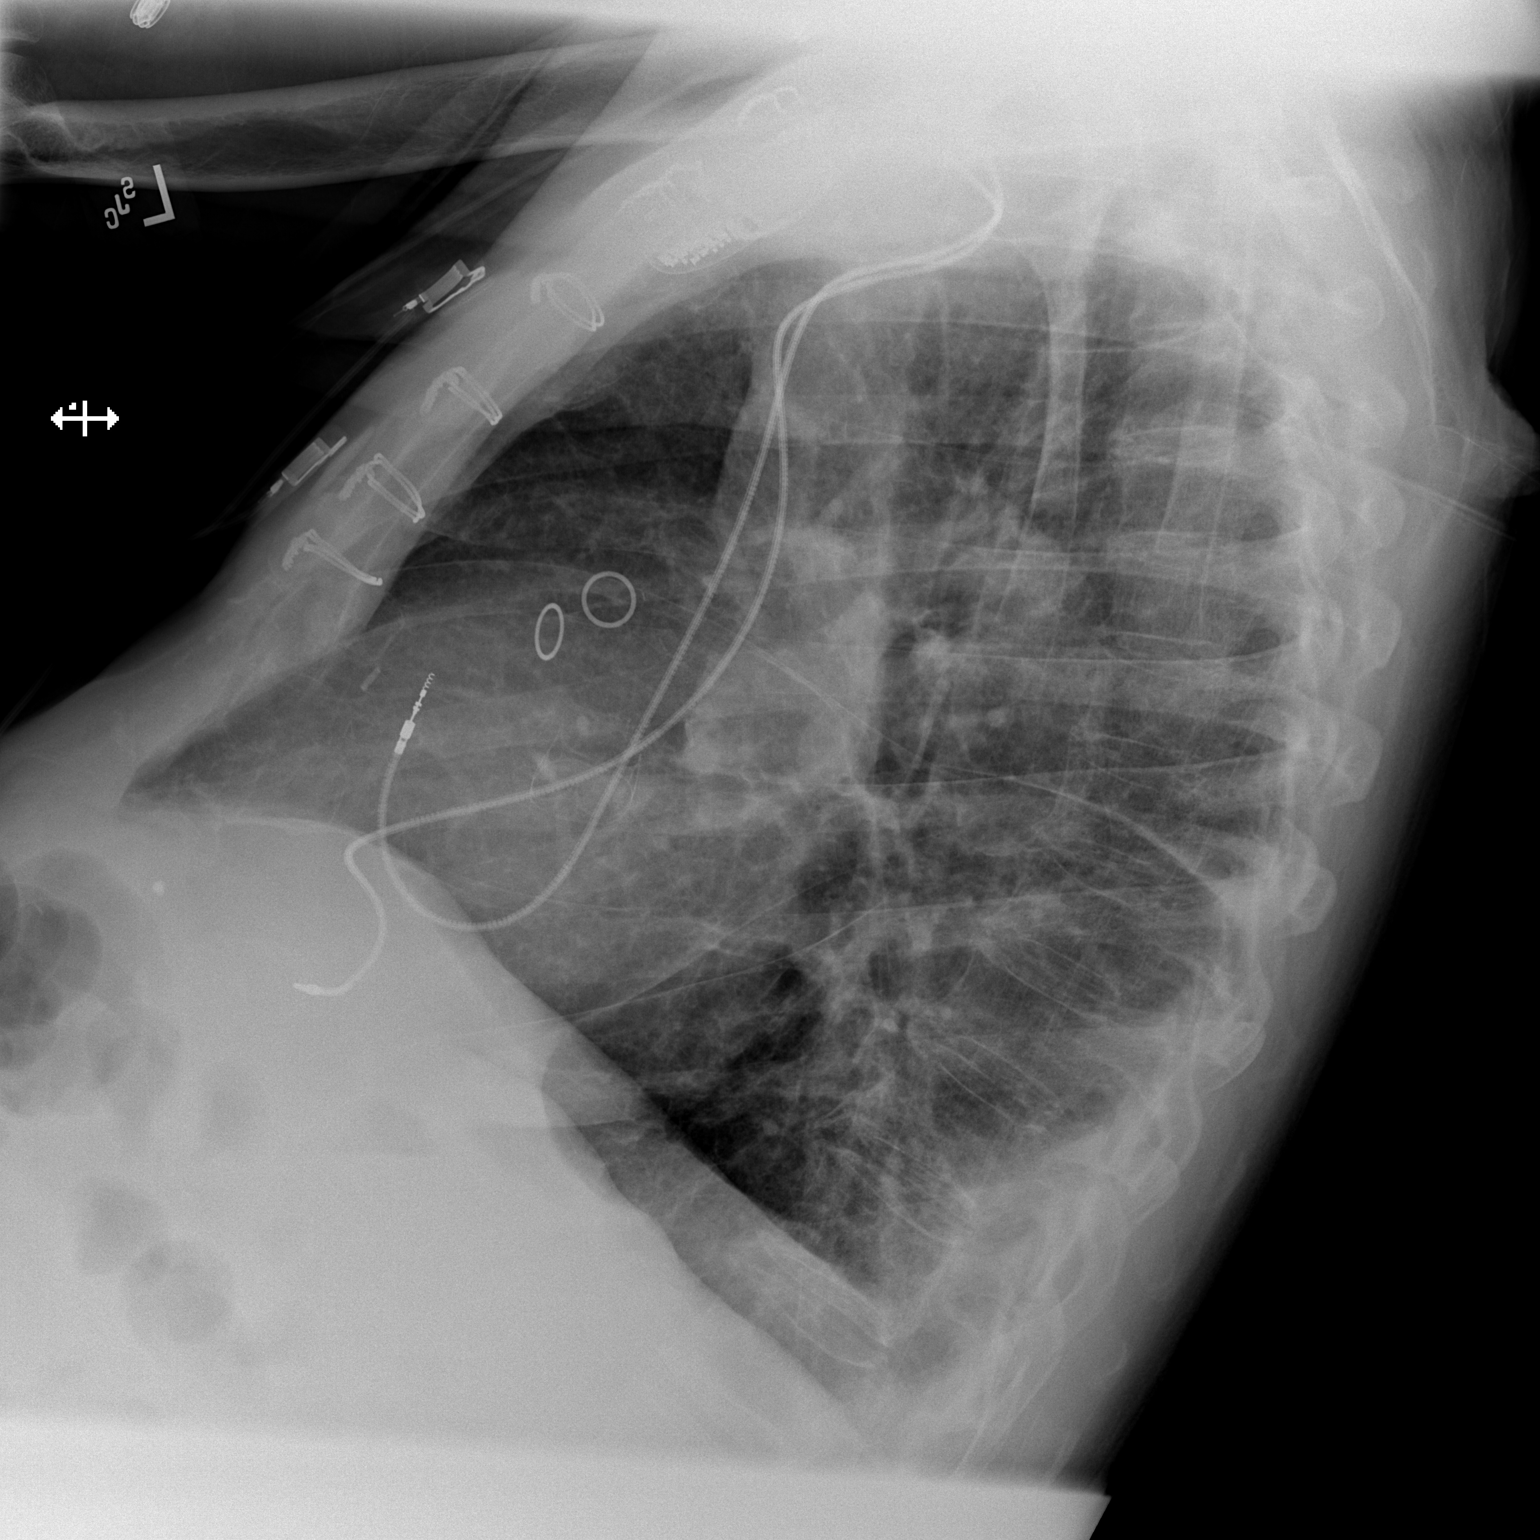

[2 of 2 positions shown; findings below may reference images not displayed]

FINDINGS: The lungs are well-expanded. The density in the right perihilar and
infrahilar region is less conspicuous on this study today. There is
hazy density that projects in the periphery of the left mid lung
which is felt to reflect overlap with the scapula. The cardiac
silhouette is top-normal in size. The central pulmonary vascularity
remains mildly engorged with slightly decreased cephalization. There
is no large pleural effusion but a small amount of pleural fluid
layering posteriorly, bilaterally is suspected. The permanent
pacemaker is in reasonable position. There are 7 intact sternal
wires from previous CABG.
IMPRESSION: Decreased pulmonary interstitial edema secondary to CHF. Small
bilateral pleural effusions layering posteriorly. There is no
alveolar pneumonia.

## 2016-07-22 IMAGING — XA IR NEPHROSTOMY PLACEMENT LEFT
1 series · 1 of 1 positions shown · non-contrast
Comparison: Renal ultrasound on 12/26/2015

ADDENDUM:
The patient's level of consciousness and physiologic status were
continuously monitored during the procedure by Radiology nursing.
CLINICAL DATA: Progressive renal failure with evidence of bilateral
hydronephrosis. The patient requires percutaneous nephrostomy tube
placement.

EXAM:
1. ULTRASOUND GUIDANCE FOR PUNCTURE OF THE LEFT RENAL COLLECTING
SYSTEM.
2. LEFT PERCUTANEOUS NEPHROSTOMY TUBE PLACEMENT.
3. ULTRASOUND GUIDANCE FOR PUNCTURE OF THE RIGHT RENAL COLLECTING
4. RIGHT PERCUTANEOUS NEPHROSTOMY TUBE PLACEMENT.

[Series 1: fl - angio · 1 of 1 slices shown]
[im 1/1]
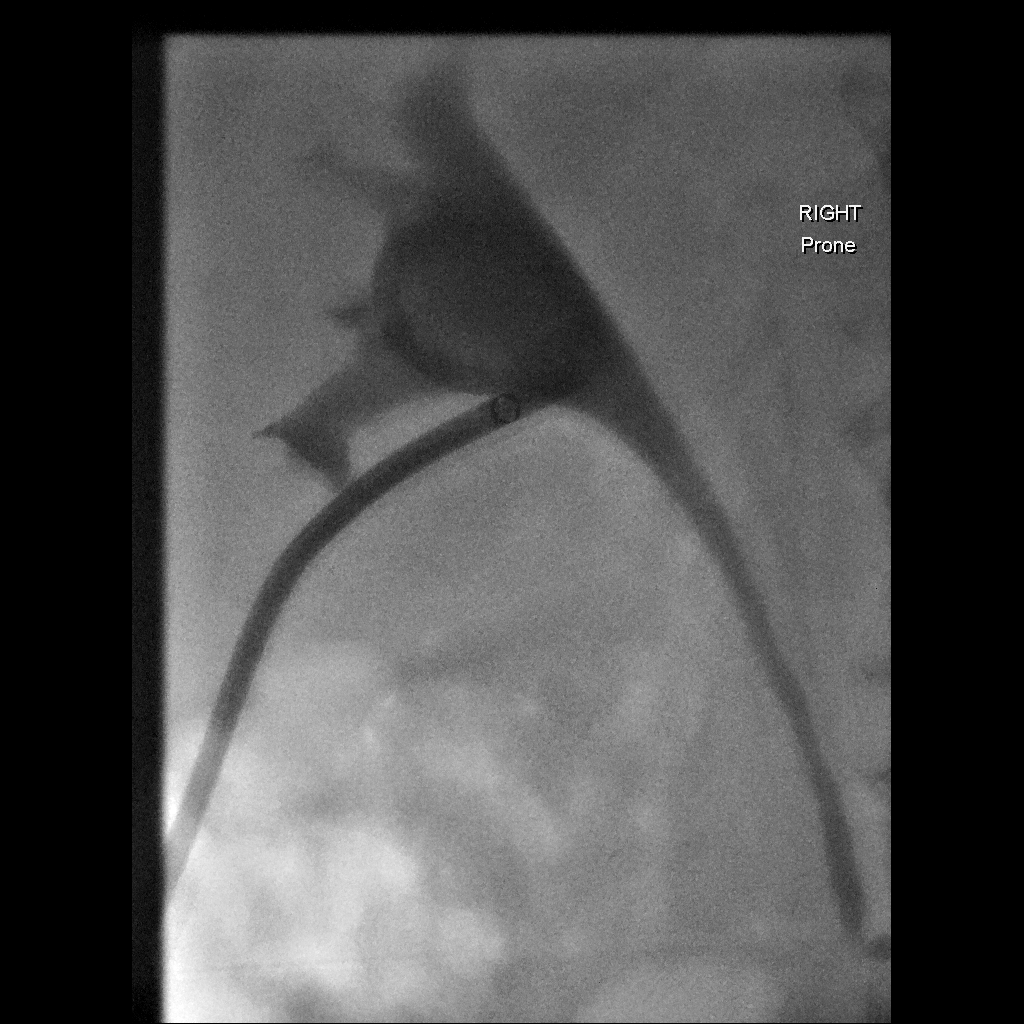

[1 of 1 positions shown; findings below may reference images not displayed]

ANESTHESIA/SEDATION:
3.0 mg IV Versed; 75 mcg IV Fentanyl.

Total Moderate Sedation Time

30 minutes

CONTRAST:  10 ml Omnipaque 300

MEDICATIONS:
400 mg IV Cipro. Ciprofloxacin was given within two hours of
incision.

FLUOROSCOPY TIME:  1 minutes and 30 seconds.

PROCEDURE:
The procedure, risks, benefits, and alternatives were explained to
the patient. Questions regarding the procedure were encouraged and
answered. The patient understands and consents to the procedure.

Bilateral flank regions were prepped with chlorhexidine in a sterile
fashion, and a sterile drape was applied covering the operative
field. A sterile gown and sterile gloves were used for the
procedure. Local anesthesia was provided with 1% Lidocaine.

Ultrasound was used to localize the left kidney. Under direct
ultrasound guidance, a 21 gauge needle was advanced into the renal
collecting system. Ultrasound image documentation was performed.
Aspiration of urine sample was performed followed by contrast
injection.

A transitional dilator was advanced over a guidewire. Percutaneous
tract dilatation was then performed over the guidewire. A 10 French
percutaneous nephrostomy tube was then advanced and formed in the
collecting system. Catheter position was confirmed by fluoroscopy
after contrast injection.

The right kidney was localized by ultrasound. A 21 gauge needle was
advanced into the collecting system under ultrasound guidance.
Contrast was injected. The tract was dilated over a wire and a 10
French percutaneous nephrostomy tube advanced and formed in the
collecting system. Catheter positioning was confirmed by fluoroscopy
after contrast injection.

Both catheters were connected to gravity drainage bags and secured
at the skin with Prolene retention sutures and StatLock devices.

COMPLICATIONS:
None.
FINDINGS: Ultrasound demonstrates moderate bilateral hydronephrosis. Bilateral
nephrostomy tubes were successfully placed and formed in each renal
pelvis. There is good return of urine from both catheters after
placement.
IMPRESSION: Bilateral percutaneous nephrostomy tube placement. Bilateral 10
French nephrostomy tubes were placed and formed at the level of each
renal pelvis.

## 2016-07-22 IMAGING — XA IR NEPHROSTOMY PLACEMENT RIGHT
2 series · 2 of 2 positions shown · non-contrast
Comparison: Renal ultrasound on 12/26/2015

ADDENDUM:
The patient's level of consciousness and physiologic status were
continuously monitored during the procedure by Radiology nursing.
CLINICAL DATA: Progressive renal failure with evidence of bilateral
hydronephrosis. The patient requires percutaneous nephrostomy tube
placement.

EXAM:
1. ULTRASOUND GUIDANCE FOR PUNCTURE OF THE LEFT RENAL COLLECTING
SYSTEM.
2. LEFT PERCUTANEOUS NEPHROSTOMY TUBE PLACEMENT.
3. ULTRASOUND GUIDANCE FOR PUNCTURE OF THE RIGHT RENAL COLLECTING
4. RIGHT PERCUTANEOUS NEPHROSTOMY TUBE PLACEMENT.

[Series 1: fl - angio · 1 of 1 slices shown (1 of 2)]
[im 1/1]
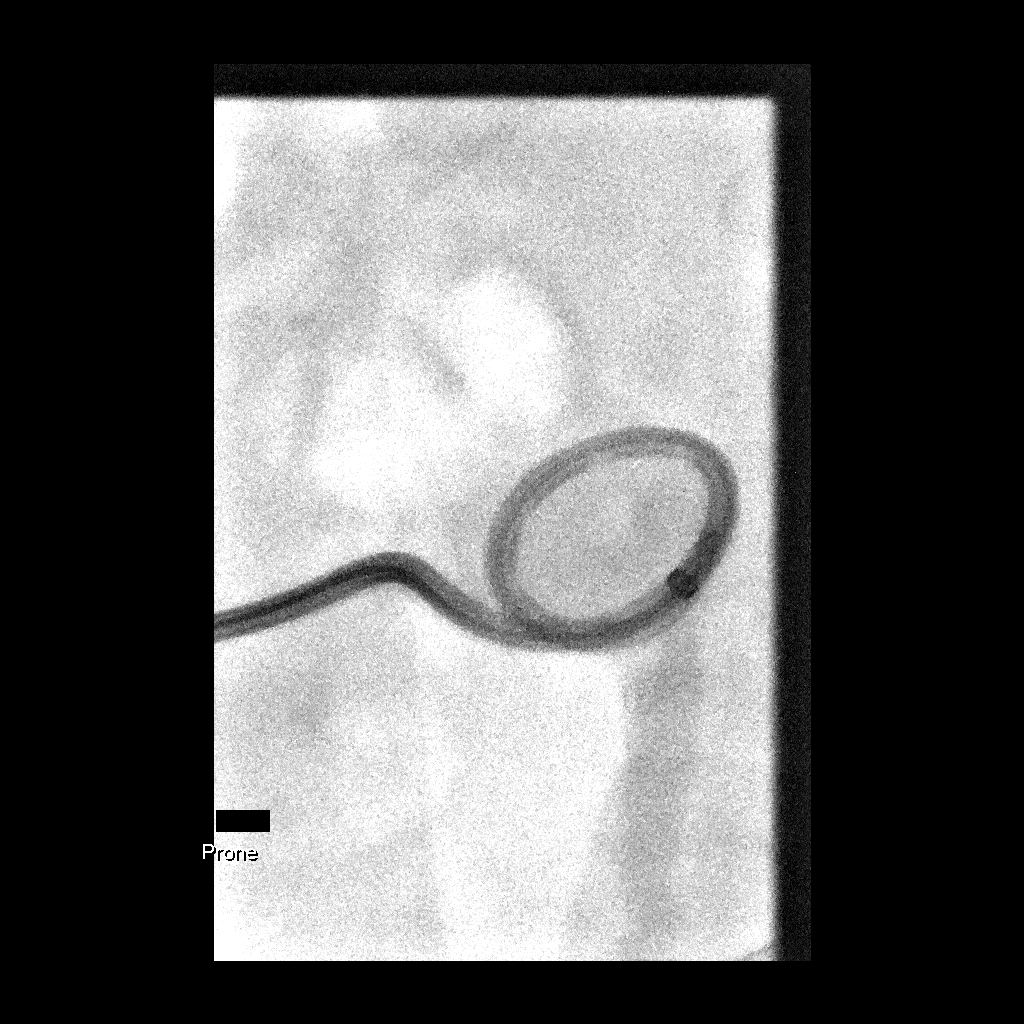

[Series 2: fl - angio · 1 of 1 slices shown (2 of 2)]
[im 1/1]
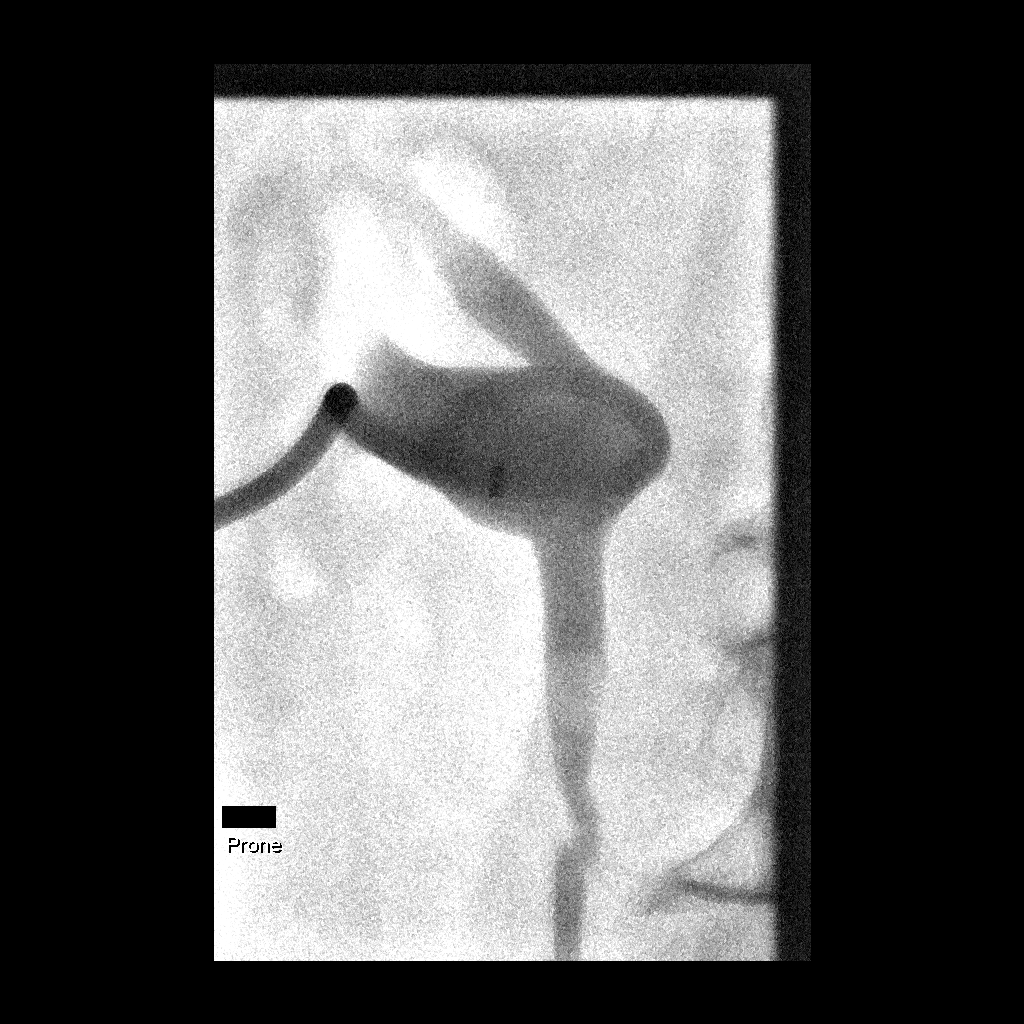

[2 of 2 positions shown; findings below may reference images not displayed]

ANESTHESIA/SEDATION:
3.0 mg IV Versed; 75 mcg IV Fentanyl.

Total Moderate Sedation Time

30 minutes

CONTRAST:  10 ml Omnipaque 300

MEDICATIONS:
400 mg IV Cipro. Ciprofloxacin was given within two hours of
incision.

FLUOROSCOPY TIME:  1 minutes and 30 seconds.

PROCEDURE:
The procedure, risks, benefits, and alternatives were explained to
the patient. Questions regarding the procedure were encouraged and
answered. The patient understands and consents to the procedure.

Bilateral flank regions were prepped with chlorhexidine in a sterile
fashion, and a sterile drape was applied covering the operative
field. A sterile gown and sterile gloves were used for the
procedure. Local anesthesia was provided with 1% Lidocaine.

Ultrasound was used to localize the left kidney. Under direct
ultrasound guidance, a 21 gauge needle was advanced into the renal
collecting system. Ultrasound image documentation was performed.
Aspiration of urine sample was performed followed by contrast
injection.

A transitional dilator was advanced over a guidewire. Percutaneous
tract dilatation was then performed over the guidewire. A 10 French
percutaneous nephrostomy tube was then advanced and formed in the
collecting system. Catheter position was confirmed by fluoroscopy
after contrast injection.

The right kidney was localized by ultrasound. A 21 gauge needle was
advanced into the collecting system under ultrasound guidance.
Contrast was injected. The tract was dilated over a wire and a 10
French percutaneous nephrostomy tube advanced and formed in the
collecting system. Catheter positioning was confirmed by fluoroscopy
after contrast injection.

Both catheters were connected to gravity drainage bags and secured
at the skin with Prolene retention sutures and StatLock devices.

COMPLICATIONS:
None.
FINDINGS: Ultrasound demonstrates moderate bilateral hydronephrosis. Bilateral
nephrostomy tubes were successfully placed and formed in each renal
pelvis. There is good return of urine from both catheters after
placement.
IMPRESSION: Bilateral percutaneous nephrostomy tube placement. Bilateral 10
French nephrostomy tubes were placed and formed at the level of each
renal pelvis.

## 2016-08-28 ENCOUNTER — Ambulatory Visit: Admission: RE | Admit: 2016-08-28 | Payer: Medicare Other | Source: Ambulatory Visit

## 2016-09-03 ENCOUNTER — Ambulatory Visit
Admission: RE | Admit: 2016-09-03 | Discharge: 2016-09-03 | Disposition: A | Discharge status: Home / Self Care | Payer: Medicare Other | Source: Ambulatory Visit | Attending: Urology | Admitting: Urology

## 2016-09-03 DIAGNOSIS — Z936 Other artificial openings of urinary tract status: Secondary | ICD-10-CM | POA: Insufficient documentation

## 2016-09-03 DIAGNOSIS — N329 Bladder disorder, unspecified: Secondary | ICD-10-CM | POA: Diagnosis not present

## 2016-09-03 DIAGNOSIS — R590 Localized enlarged lymph nodes: Secondary | ICD-10-CM | POA: Insufficient documentation

## 2016-09-03 DIAGNOSIS — K8020 Calculus of gallbladder without cholecystitis without obstruction: Secondary | ICD-10-CM | POA: Diagnosis not present

## 2016-09-03 DIAGNOSIS — N200 Calculus of kidney: Secondary | ICD-10-CM | POA: Insufficient documentation

## 2016-09-03 DIAGNOSIS — I700 Atherosclerosis of aorta: Secondary | ICD-10-CM | POA: Diagnosis not present

## 2016-09-03 DIAGNOSIS — R1084 Generalized abdominal pain: Secondary | ICD-10-CM

## 2016-09-03 DIAGNOSIS — C674 Malignant neoplasm of posterior wall of bladder: Secondary | ICD-10-CM | POA: Insufficient documentation

## 2016-09-03 DIAGNOSIS — J432 Centrilobular emphysema: Secondary | ICD-10-CM | POA: Insufficient documentation

## 2016-09-03 DIAGNOSIS — N400 Enlarged prostate without lower urinary tract symptoms: Secondary | ICD-10-CM | POA: Insufficient documentation

## 2016-09-03 DIAGNOSIS — R748 Abnormal levels of other serum enzymes: Secondary | ICD-10-CM

## 2016-09-05 ENCOUNTER — Other Ambulatory Visit: Payer: Self-pay | Admitting: Radiology

## 2016-09-06 ENCOUNTER — Ambulatory Visit
Admission: RE | Admit: 2016-09-06 | Discharge: 2016-09-06 | Disposition: A | Discharge status: Home / Self Care | Payer: Medicare Other | Source: Ambulatory Visit | Attending: Urology | Admitting: Urology

## 2016-09-06 DIAGNOSIS — Y842 Radiological procedure and radiotherapy as the cause of abnormal reaction of the patient, or of later complication, without mention of misadventure at the time of the procedure: Secondary | ICD-10-CM | POA: Insufficient documentation

## 2016-09-06 DIAGNOSIS — N3040 Irradiation cystitis without hematuria: Secondary | ICD-10-CM | POA: Diagnosis not present

## 2016-09-06 DIAGNOSIS — E785 Hyperlipidemia, unspecified: Secondary | ICD-10-CM | POA: Diagnosis not present

## 2016-09-06 DIAGNOSIS — C679 Malignant neoplasm of bladder, unspecified: Secondary | ICD-10-CM | POA: Diagnosis not present

## 2016-09-06 DIAGNOSIS — Z466 Encounter for fitting and adjustment of urinary device: Secondary | ICD-10-CM | POA: Insufficient documentation

## 2016-09-06 DIAGNOSIS — Z79899 Other long term (current) drug therapy: Secondary | ICD-10-CM | POA: Diagnosis not present

## 2016-09-06 DIAGNOSIS — Z87891 Personal history of nicotine dependence: Secondary | ICD-10-CM | POA: Insufficient documentation

## 2016-09-06 DIAGNOSIS — I10 Essential (primary) hypertension: Secondary | ICD-10-CM | POA: Diagnosis not present

## 2016-09-06 DIAGNOSIS — C674 Malignant neoplasm of posterior wall of bladder: Secondary | ICD-10-CM

## 2016-09-06 HISTORY — DX: Presence of cardiac pacemaker: Z95.0

## 2016-09-06 HISTORY — PX: IR GENERIC HISTORICAL: IMG1180011

## 2016-09-06 MED ORDER — IOHEXOL 300 MG/ML  SOLN
10.0000 mL | Freq: Once | INTRAMUSCULAR | Status: AC | PRN
  Administered 2016-09-06: 15 mL

## 2016-09-06 MED ORDER — MIDAZOLAM HCL 5 MG/5ML IJ SOLN
INTRAMUSCULAR | Status: AC | PRN
  Administered 2016-09-06: 1 mg via INTRAVENOUS
  Administered 2016-09-06: 0.5 mg via INTRAVENOUS

## 2016-09-06 MED ORDER — LIDOCAINE HCL (PF) 1 % IJ SOLN
INTRAMUSCULAR | Status: DC | PRN
  Administered 2016-09-06: 6 mL

## 2016-09-06 MED ORDER — CIPROFLOXACIN IN D5W 400 MG/200ML IV SOLN
400.0000 mg | INTRAVENOUS | Status: AC
  Administered 2016-09-06: 400 mg via INTRAVENOUS

## 2016-09-06 MED ORDER — SODIUM CHLORIDE 0.9 % IV SOLN
INTRAVENOUS | Status: AC
  Administered 2016-09-06: 10:00:00 via INTRAVENOUS

## 2016-09-06 MED ORDER — FENTANYL CITRATE (PF) 100 MCG/2ML IJ SOLN
INTRAMUSCULAR | Status: AC | PRN
  Administered 2016-09-06: 50 ug via INTRAVENOUS

## 2016-09-06 NOTE — Procedures (Signed)
S/p fluoroscopic bilateral 10 French nephrostomy exchanges.  No complications  EBL 0  Keep to external gravity drainage bag  Full report in PACs

## 2016-09-06 NOTE — Progress Notes (Signed)
Patient ID: Austin Contreras, male   DOB: Oct 03, 1926, 82 y.o.   MRN: [id number]       Chief Complaint:  Bladder cancer, chronic radiation cystitis, chronic external nephrostomies.  Referring Physician(s): Brandon,Ashley  History of Present Illness: Austin Contreras is a 82 y.o. male with a prior history of bladder cancer and radiation cystitis. This was Complicated by obstructive hydronephrosis bilaterally. This now is managed with bilateral external nephrostomies. He returns for outpatient routine exchange. No complaints today. Overall he is doing very well. No flank pain, abdominal pain, or fever. No hematuria.  Past Medical History:  Diagnosis Date  . A-fib ([place])   . B12 deficiency anemia 08/02/2015  . Bladder cancer ([place])    stage 2 s/p TUBTR  . BPH (benign prostatic hyperplasia)   . CKD (chronic kidney disease) stage 3, GFR 30-59 ml/min   . Coronary artery disease   . GERD (gastroesophageal reflux disease)   . H/O aortic valve replacement with porcine valve   . Hyperlipidemia   . Hypertension   . PONV (postoperative nausea and vomiting)   . Presence of permanent cardiac pacemaker   . SVT (supraventricular tachycardia) ([facility])     Past Surgical History:  Procedure Laterality Date  . AORTIC VALVE REPLACEMENT    . APPENDECTOMY    . CATARACT EXTRACTION, BILATERAL    . cataracts    . CORONARY ARTERY BYPASS GRAFT    . CYSTOSCOPY WITH FULGERATION Bilateral 12/23/2015   Procedure: CYSTOSCOPY WITH FULGERATION;  Surgeon: Royston Cowper, MD;  Location: [facility] ORS;  Service: Urology;  Laterality: Bilateral;  . HAND SURGERY     after trauma  . PACEMAKER INSERTION    . PREPATELLAR BURSA EXCISION    . ROTATOR CUFF REPAIR     right shoulder  . TRANSURETHRAL RESECTION OF BLADDER TUMOR N/A 10/17/2015   Procedure: TRANSURETHRAL RESECTION OF BLADDER TUMOR (TURBT);  Surgeon: Royston Cowper, MD;  Location: [facility] ORS;  Service: Urology;  Laterality: N/A;    Allergies: Penicillins and Flu virus  vaccine  Medications: Prior to Admission medications   Medication Sig Start Date End Date Taking? Authorizing Provider  cyanocobalamin (,VITAMIN B-12,) 1000 MCG/ML injection Inject 1,000 mcg into the muscle every 30 (thirty) days.   Yes Historical Provider, MD  enalapril (VASOTEC) 5 MG tablet Take 5 mg by mouth daily.    Yes Historical Provider, MD  furosemide (LASIX) 20 MG tablet Take by mouth.   Yes Historical Provider, MD  simvastatin (ZOCOR) 10 MG tablet Take 10 mg by mouth at bedtime.    Yes Historical Provider, MD  sotalol (BETAPACE) 80 MG tablet Take 80 mg by mouth 2 (two) times daily.    Yes Historical Provider, MD  traZODone (DESYREL) 50 MG tablet Take by mouth. Reported on 07/03/2016 01/10/16 01/09/17 Yes Historical Provider, MD  acetaminophen-codeine (TYLENOL #3) 300-30 MG tablet Take 1 tablet by mouth every 4 (four) hours as needed for moderate pain. Patient not taking: Reported on 07/03/2016 01/01/16   Bettey Costa, MD  docusate sodium (COLACE) 100 MG capsule Take 2 capsules (200 mg total) by mouth 2 (two) times daily. Patient not taking: Reported on 05/24/2016 10/17/15   Royston Cowper, MD  hydrocortisone (ANUSOL-HC) 25 MG suppository Place rectally. Reported on 07/03/2016 12/20/15   Historical Provider, MD  Sodium Chloride Flush (NORMAL SALINE FLUSH) 0.9 % SOLN Inject 10 mLs into the vein QID. 01/03/16   Hollice Espy, MD     Family History  Problem Relation Age  of Onset  . Lymphoma Sister   . Bone cancer Mother   . CAD Father   . CVA Father   . Prostate cancer Neg Hx   . Bladder Cancer Neg Hx   . Kidney cancer Neg Hx     Social History   Social History  . Marital status: Married    Spouse name: N/A  . Number of children: N/A  . Years of education: N/A   Social History Main Topics  . Smoking status: Former Smoker    Types: Cigarettes    Quit date: 09/07/1979  . Smokeless tobacco: Never Used     Comment: Quit in 1999  . Alcohol use No  . Drug use: No  . Sexual activity:  Not Asked   Other Topics Concern  . None   Social History Narrative   Lives at home with wife, very independent. Active at baseline.      Review of Systems: A 12 point ROS discussed and pertinent positives are indicated in the HPI above.  All other systems are negative.  Review of Systems  Vital Signs: BP (!) 143/80   Pulse 69   Temp 97.8 F (36.6 C) (Oral)   Resp 16   Ht 5' 10.5" (1.791 m)   Wt 163 lb (73.9 kg)   SpO2 99%   BMI 23.06 kg/m   Physical Exam  Constitutional: He is oriented to person, place, and time. He appears well-developed and well-nourished. No distress.  Eyes: Conjunctivae are normal. No scleral icterus.  Cardiovascular: Normal rate and normal heart sounds.  Exam reveals no friction rub.   No murmur heard. Pulmonary/Chest: Effort normal and breath sounds normal. No respiratory distress.  Abdominal: Soft. Bowel sounds are normal. He exhibits no distension.  Musculoskeletal: He exhibits no edema.  Neurological: He is alert and oriented to person, place, and time.  Skin: Skin is warm and dry. He is not diaphoretic. No erythema.  Psychiatric: He has a normal mood and affect. His behavior is normal.    Mallampati Score:   1  Imaging: Ct Abdomen Pelvis Wo Contrast  Result Date: 09/03/2016 CLINICAL DATA:  Staging of recurrent bladder cancer. Asymptomatic. Treated with radiation therapy 2 years ago. EXAM: CT CHEST, ABDOMEN AND PELVIS WITHOUT CONTRAST TECHNIQUE: Multidetector CT imaging of the chest, abdomen and pelvis was performed following the standard protocol without IV contrast. COMPARISON:  01/26/2016 abdominal pelvic CT. Most recent chest CT 03/10/2014. FINDINGS: CT CHEST FINDINGS Cardiovascular: pacer. Aortic atherosclerosis. Mild cardiomegaly. Aortic valve repair. Prior median sternotomy. Mediastinum/Nodes: No mediastinal or definite hilar adenopathy, given limitations of unenhanced CT. Lungs/Pleura: No pleural fluid. Minimal left pleural thickening.  Moderate centrilobular emphysema. Lower lobe predominant bronchial wall thickening. Minimal right upper lobe nodularity, including at 2 mm on image 47/series 4. Felt to be similar on the prior exam and more apparent today secondary to thinner slice combination. Posterior right upper lobe 2 mm nodule on image 72/series 4 is likely similar Left lower lobe pulmonary nodules on the order of 2 mm are also felt to be similar, including on image 91/series 4. Musculoskeletal: Right rotator cuff repair. CT ABDOMEN PELVIS FINDINGS Hepatobiliary: Normal liver. Multiple small gallstones without acute cholecystitis or biliary duct dilatation. Pancreas: Normal pancreas for age, without mass or duct dilatation. Spleen: Normal in size, without focal abnormality. Adrenals/Urinary Tract: Normal adrenal glands. Bilateral renal cortical thinning. Left lower pole collecting system stones up to 8 mm. Bilateral nephrostomy tubes in place. No hydronephrosis. Although there is no significant  hydroureter, a punctate stone is identified the distal left ureter on image 109/series 2. This is new. Collapsed urinary bladder with thickened, partially calcified wall. No gross transmural disease. Mild pericystic interstitial thickening, likely treatment related. Stomach/Bowel: Normal stomach, without wall thickening. Extensive colonic diverticulosis. Normal terminal ileum. Normal small bowel. Vascular/Lymphatic: Aortic and branch vessel atherosclerosis. A right external iliac node measures 8 mm on image 106/series 2 versus 6 mm on the prior exam. Reproductive: Moderate prostatomegaly. Other: No significant free fluid. Fat containing left inguinal hernia. Musculoskeletal: No acute osseous abnormality. Degenerative disc disease at L4-5 and L5-S1. IMPRESSION: CT CHEST IMPRESSION 1.  No acute process or evidence of metastatic disease in the chest. 2. Tiny pulmonary nodules, primarily felt to be similar. Some are more conspicuous today secondary to  differences in slice collimation. Primarily felt to represent subpleural lymph nodes. 3. Centrilobular emphysema. 4.  Aortic atherosclerosis. CT ABDOMEN AND PELVIS IMPRESSION 1. Bladder wall thickening and calcification which could be treatment related or represent residual/recurrent disease. No transmural spread. 2. A right external iliac node which is not pathologic by size criteria but has enlarged since the prior exam. This is suspicious for early nodal metastasis. 3. Left nephrolithiasis. Bilateral nephrostomy catheters in place without hydronephrosis. 4. Although there is no significant hydroureter, a new tiny distal left ureteric stone is identified. 5. Prostatomegaly. 6. Cholelithiasis. Electronically Signed   By: Abigail Miyamoto M.D.   On: 09/03/2016 09:36   Ct Chest Wo Contrast  Result Date: 09/03/2016 CLINICAL DATA:  Staging of recurrent bladder cancer. Asymptomatic. Treated with radiation therapy 2 years ago. EXAM: CT CHEST, ABDOMEN AND PELVIS WITHOUT CONTRAST TECHNIQUE: Multidetector CT imaging of the chest, abdomen and pelvis was performed following the standard protocol without IV contrast. COMPARISON:  01/26/2016 abdominal pelvic CT. Most recent chest CT 03/10/2014. FINDINGS: CT CHEST FINDINGS Cardiovascular: pacer. Aortic atherosclerosis. Mild cardiomegaly. Aortic valve repair. Prior median sternotomy. Mediastinum/Nodes: No mediastinal or definite hilar adenopathy, given limitations of unenhanced CT. Lungs/Pleura: No pleural fluid. Minimal left pleural thickening. Moderate centrilobular emphysema. Lower lobe predominant bronchial wall thickening. Minimal right upper lobe nodularity, including at 2 mm on image 47/series 4. Felt to be similar on the prior exam and more apparent today secondary to thinner slice combination. Posterior right upper lobe 2 mm nodule on image 72/series 4 is likely similar Left lower lobe pulmonary nodules on the order of 2 mm are also felt to be similar, including on image  91/series 4. Musculoskeletal: Right rotator cuff repair. CT ABDOMEN PELVIS FINDINGS Hepatobiliary: Normal liver. Multiple small gallstones without acute cholecystitis or biliary duct dilatation. Pancreas: Normal pancreas for age, without mass or duct dilatation. Spleen: Normal in size, without focal abnormality. Adrenals/Urinary Tract: Normal adrenal glands. Bilateral renal cortical thinning. Left lower pole collecting system stones up to 8 mm. Bilateral nephrostomy tubes in place. No hydronephrosis. Although there is no significant hydroureter, a punctate stone is identified the distal left ureter on image 109/series 2. This is new. Collapsed urinary bladder with thickened, partially calcified wall. No gross transmural disease. Mild pericystic interstitial thickening, likely treatment related. Stomach/Bowel: Normal stomach, without wall thickening. Extensive colonic diverticulosis. Normal terminal ileum. Normal small bowel. Vascular/Lymphatic: Aortic and branch vessel atherosclerosis. A right external iliac node measures 8 mm on image 106/series 2 versus 6 mm on the prior exam. Reproductive: Moderate prostatomegaly. Other: No significant free fluid. Fat containing left inguinal hernia. Musculoskeletal: No acute osseous abnormality. Degenerative disc disease at L4-5 and L5-S1. IMPRESSION: CT CHEST IMPRESSION 1.  No acute process or evidence of metastatic disease in the chest. 2. Tiny pulmonary nodules, primarily felt to be similar. Some are more conspicuous today secondary to differences in slice collimation. Primarily felt to represent subpleural lymph nodes. 3. Centrilobular emphysema. 4.  Aortic atherosclerosis. CT ABDOMEN AND PELVIS IMPRESSION 1. Bladder wall thickening and calcification which could be treatment related or represent residual/recurrent disease. No transmural spread. 2. A right external iliac node which is not pathologic by size criteria but has enlarged since the prior exam. This is suspicious  for early nodal metastasis. 3. Left nephrolithiasis. Bilateral nephrostomy catheters in place without hydronephrosis. 4. Although there is no significant hydroureter, a new tiny distal left ureteric stone is identified. 5. Prostatomegaly. 6. Cholelithiasis. Electronically Signed   By: Abigail Miyamoto M.D.   On: 09/03/2016 09:36    Labs:  CBC:  Recent Labs  12/28/15 0517 12/29/15 0634 12/30/15 0512 12/31/15 0441  WBC 6.3 4.6 4.7 5.3  HGB 8.1* 8.1* 8.4* 8.3*  HCT 24.9* 24.7* 26.0* 24.7*  PLT 241 240 271 261    COAGS:  Recent Labs  12/22/15 1055  INR 1.12  APTT 36    BMP:  Recent Labs  12/30/15 0512 12/31/15 0441 01/01/16 0519 01/31/16 1205  NA 141 140 139 140  K 4.7 4.5 4.7 5.3*  CL 114* 113* 111 102  CO2 19* 21* 21* 21  GLUCOSE 99 107* 106* 90  BUN 40* 29* 23* 19  CALCIUM 8.1* 7.9* 7.9* 9.1  CREATININE 2.01* 1.57* 1.54* 1.58*  GFRNONAA 28* 38* 39* 39*  GFRAA 33* 44* 45* 45*    LIVER FUNCTION TESTS:  Recent Labs  12/22/15 1055 12/27/15 0610 01/31/16 1205  BILITOT 1.7* 2.5* 0.4  AST 40 19 11  ALT 124* 32 10  ALKPHOS 104 81 86  PROT 6.9 6.6 6.4  ALBUMIN 3.7 2.9* 3.8      Assessment and Plan:  Bladder cancer and chronic radiation cystitis, managed with external nephrostomies. He returns for outpatient routine exchange today.  Continue nephrostomy exchanges every 2 months.   Electronically Signed: Greggory Keen 09/06/2016, 9:49 AM   I spent a total of    15 Minutes in face to face in clinical consultation, greater than 50% of which was counseling/coordinating care for this patient with chronic external nephrostomies

## 2016-09-11 ENCOUNTER — Encounter: Payer: Self-pay | Admitting: Urology

## 2016-09-11 ENCOUNTER — Ambulatory Visit (INDEPENDENT_AMBULATORY_CARE_PROVIDER_SITE_OTHER): Payer: Medicare Other | Admitting: Urology

## 2016-09-11 VITALS — BP 154/103 | HR 87 | Ht 70.0 in | Wt 166.0 lb

## 2016-09-11 DIAGNOSIS — C678 Malignant neoplasm of overlapping sites of bladder: Secondary | ICD-10-CM

## 2016-09-11 DIAGNOSIS — N3040 Irradiation cystitis without hematuria: Secondary | ICD-10-CM | POA: Diagnosis not present

## 2016-09-11 DIAGNOSIS — N183 Chronic kidney disease, stage 3 unspecified: Secondary | ICD-10-CM

## 2016-09-11 NOTE — Progress Notes (Signed)
09/11/2016 8:55 AM   Austin Contreras Sep 26, 1926 [id number]  Referring provider: Kirk Ruths, MD [facility] [place], [place] [address]  Chief Complaint  Patient presents with  . Bladder Cancer    2 month follow up on neph. tube    HPI: 82 year old male with a history of muscle invasive bladder cancer status post radiation managed with bilateral percutaneous nephrostomy tubes who Richard today for follow-up CT chest abdomen pelvis.  Bladder cancer Diagnosed with muscle invasive bladder cancer in March 2015. He underwent bladder sparing external beam radiation protocol by Dr. Baruch Gouty in July 2015.  He was also followed by Dr. Inez Pilgrim in the cancer center but was not offered chemotherapy.  He had a recurrence noted on surveillance cystoscopy s/p repeat TURBT by Dr. Yves Dill on 10/17/15 c/w CIS with early papillary change.  No BCG therapy to date.  No current restaging imaging. Staging CT abd/ pelvis without contrast 01/26/16 shows no obvious metastatic diease.  Considered but ultimately declined BCG.  Cystoscopy on 06/2016 in the office showed a punched out ulceration at the dome as well as papillary appearing tumor on the right lateral wall consistent with local disease progression.  Most recent imaging including CT chest abdomen and pelvis on 09/03/2016 showed no obvious metastatic disease although there was evidence of a right external iliac node which is not pathologic in size but enlarging since previous CT concerning for early nodal metastatic disease.  Overall, he is doing quite well. His energy is slightly decreased but he still very active, outside working in the yard and mowing the lawn.  Radiation cystitis  Chronic recurrent gross hematuria since November 2016. Admitted to the hospital in early January 2017 refractory bleeding.  Underwent cystoscopy, clot a pack by Dr. Eliberto Ivory on 12/23/2015 noted to have small capacity 50 cc bladder with severe  radiation cystitis.  Required blood transfusion.   Resolved with bilateral nephrostomy tube placement.  Last tube exchange 09/06/16, doing well with tubes.  Acute on chronic renal failure Developed acute on chronic renal failure with a creatinine of 3.71 during his admission with evidence of bilateral hydronephrosis.  Underwent placement of bilateral percutaneous nephrostomy tubes for management of radiation cystitis, small capacity bladder. Baseline creatinine 1.7.  Most recent creatinine on 1.7 on 03/2016 following nephrostomy tube placement.  He does void small amounts per urethral several times a day of various colors.     PMH: Past Medical History:  Diagnosis Date  . A-fib ([place])   . B12 deficiency anemia 08/02/2015  . Bladder cancer ([place])    stage 2 s/p TUBTR  . BPH (benign prostatic hyperplasia)   . CKD (chronic kidney disease) stage 3, GFR 30-59 ml/min   . Coronary artery disease   . GERD (gastroesophageal reflux disease)   . H/O aortic valve replacement with porcine valve   . Hyperlipidemia   . Hypertension   . PONV (postoperative nausea and vomiting)   . Presence of permanent cardiac pacemaker   . SVT (supraventricular tachycardia) [facility])     Surgical History: Past Surgical History:  Procedure Laterality Date  . AORTIC VALVE REPLACEMENT    . APPENDECTOMY    . CATARACT EXTRACTION, BILATERAL    . cataracts    . CORONARY ARTERY BYPASS GRAFT    . CYSTOSCOPY WITH FULGERATION Bilateral 12/23/2015   Procedure: CYSTOSCOPY WITH FULGERATION;  Surgeon: Royston Cowper, MD;  Location: [facility] ORS;  Service: Urology;  Laterality: Bilateral;  . HAND SURGERY  after trauma  . IR GENERIC HISTORICAL  09/06/2016   IR NEPHROSTOMY EXCHANGE LEFT 09/06/2016 Greggory Keen, MD [facility]-INTERV RAD  . IR GENERIC HISTORICAL  09/06/2016   IR NEPHROSTOMY EXCHANGE RIGHT 09/06/2016 Greggory Keen, MD [facility]-INTERV RAD  . PACEMAKER INSERTION    . PREPATELLAR BURSA EXCISION    . ROTATOR CUFF REPAIR     right  shoulder  . TRANSURETHRAL RESECTION OF BLADDER TUMOR N/A 10/17/2015   Procedure: TRANSURETHRAL RESECTION OF BLADDER TUMOR (TURBT);  Surgeon: Royston Cowper, MD;  Location: [facility] ORS;  Service: Urology;  Laterality: N/A;    Home Medications:    Medication List       Accurate as of 09/11/16  8:55 AM. Always use your most recent med list.          cyanocobalamin 1000 MCG/ML injection Commonly known as:  (VITAMIN B-12) Inject 1,000 mcg into the muscle every 30 (thirty) days.   enalapril 5 MG tablet Commonly known as:  VASOTEC Take 5 mg by mouth daily.   furosemide 20 MG tablet Commonly known as:  LASIX Take by mouth.   Normal Saline Flush 0.9 % Soln Inject 10 mLs into the vein QID.   simvastatin 10 MG tablet Commonly known as:  ZOCOR Take 10 mg by mouth at bedtime.   sotalol 80 MG tablet Commonly known as:  BETAPACE Take 80 mg by mouth 2 (two) times daily.       Allergies:  Allergies  Allergen Reactions  . Penicillins Rash and Other (See Comments)    Has patient had a PCN reaction causing immediate rash, facial/tongue/throat swelling, SOB or lightheadedness with hypotension: No Has patient had a PCN reaction causing severe rash involving mucus membranes or skin necrosis: No Has patient had a PCN reaction that required hospitalization No Has patient had a PCN reaction occurring within the last 10 years: No If all of the above answers are "NO", then may proceed with Cephalosporin use.  [place] Kitchen Flu Virus Vaccine Other (See Comments)    Times 3    Family History: Family History  Problem Relation Age of Onset  . Lymphoma Sister   . Bone cancer Mother   . CAD Father   . CVA Father   . Prostate cancer Neg Hx   . Bladder Cancer Neg Hx   . Kidney cancer Neg Hx     Social History:  reports that he quit smoking about 37 years ago. His smoking use included Cigarettes. He has quit using smokeless tobacco. He reports that he does not drink alcohol or use  drugs.  ROS: UROLOGY Frequent Urination?: No Hard to postpone urination?: No Burning/pain with urination?: No Get up at night to urinate?: No Leakage of urine?: No Urine stream starts and stops?: No Trouble starting stream?: No Do you have to strain to urinate?: No Blood in urine?: No Urinary tract infection?: No Sexually transmitted disease?: No Injury to kidneys or bladder?: No Painful intercourse?: No Weak stream?: No Erection problems?: No Penile pain?: No  Gastrointestinal Nausea?: No Vomiting?: No Indigestion/heartburn?: No Diarrhea?: No Constipation?: No  Constitutional Fever: No Night sweats?: No Weight loss?: No Fatigue?: No  Skin Skin rash/lesions?: No Itching?: No  Eyes Blurred vision?: No Double vision?: No  Ears/Nose/Throat Sore throat?: No Sinus problems?: No  Hematologic/Lymphatic Swollen glands?: No Easy bruising?: No  Cardiovascular Leg swelling?: No Chest pain?: No  Respiratory Cough?: No Shortness of breath?: No  Endocrine Excessive thirst?: No  Musculoskeletal Back pain?: No Joint pain?: No  Neurological Headaches?: No Dizziness?: No  Psychologic Depression?: No Anxiety?: No  Physical Exam: BP (!) 154/103   Pulse 87   Ht 5\' 10"  (1.778 m)   Wt 166 lb (75.3 kg)   BMI 23.82 kg/m   Constitutional:  Alert and oriented, No acute distress.  Accompanied by wife and daughter. HEENT: [place] AT, moist mucus membranes.  Trachea midline, no masses. Cardiovascular: No clubbing, cyanosis, or edema. Respiratory: Normal respiratory effort, no increased work of breathing. GI: Abdomen is soft, nontender, nondistended, no abdominal masses GU:  R nephrostomy tubes in place during clear yellow urine. Skin: No rashes, bruises or suspicious lesions. Neurologic: Grossly intact, no focal deficits, moving all 4 extremities. Psychiatric: Normal mood and affect.  Laboratory Data: Lab Results  Component Value Date   WBC 5.3 12/31/2015    HGB 8.3 (L) 12/31/2015   HCT 24.7 (L) 12/31/2015   MCV 92.9 12/31/2015   PLT 261 12/31/2015    Lab Results  Component Value Date   CREATININE 1.58 (H) 01/31/2016    Pertinent Imaging: Study Result   CLINICAL DATA:  Staging of recurrent bladder cancer. Asymptomatic. Treated with radiation therapy 2 years ago.  EXAM: CT CHEST, ABDOMEN AND PELVIS WITHOUT CONTRAST  TECHNIQUE: Multidetector CT imaging of the chest, abdomen and pelvis was performed following the standard protocol without IV contrast.  COMPARISON:  01/26/2016 abdominal pelvic CT. Most recent chest CT 03/10/2014.  FINDINGS: CT CHEST FINDINGS  Cardiovascular: pacer. Aortic atherosclerosis. Mild cardiomegaly. Aortic valve repair. Prior median sternotomy.  Mediastinum/Nodes: No mediastinal or definite hilar adenopathy, given limitations of unenhanced CT.  Lungs/Pleura: No pleural fluid. Minimal left pleural thickening. Moderate centrilobular emphysema. Lower lobe predominant bronchial wall thickening.  Minimal right upper lobe nodularity, including at 2 mm on image 47/series 4. Felt to be similar on the prior exam and more apparent today secondary to thinner slice combination.  Posterior right upper lobe 2 mm nodule on image 72/series 4 is likely similar  Left lower lobe pulmonary nodules on the order of 2 mm are also felt to be similar, including on image 91/series 4.  Musculoskeletal: Right rotator cuff repair.  CT ABDOMEN PELVIS FINDINGS  Hepatobiliary: Normal liver. Multiple small gallstones without acute cholecystitis or biliary duct dilatation.  Pancreas: Normal pancreas for age, without mass or duct dilatation.  Spleen: Normal in size, without focal abnormality.  Adrenals/Urinary Tract: Normal adrenal glands. Bilateral renal cortical thinning. Left lower pole collecting system stones up to 8 mm. Bilateral nephrostomy tubes in place. No hydronephrosis. Although there is no  significant hydroureter, a punctate stone is identified the distal left ureter on image 109/series 2. This is new.  Collapsed urinary bladder with thickened, partially calcified wall. No gross transmural disease. Mild pericystic interstitial thickening, likely treatment related.  Stomach/Bowel: Normal stomach, without wall thickening. Extensive colonic diverticulosis. Normal terminal ileum. Normal small bowel.  Vascular/Lymphatic: Aortic and branch vessel atherosclerosis. A right external iliac node measures 8 mm on image 106/series 2 versus 6 mm on the prior exam.  Reproductive: Moderate prostatomegaly.  Other: No significant free fluid. Fat containing left inguinal hernia.  Musculoskeletal: No acute osseous abnormality. Degenerative disc disease at L4-5 and L5-S1.  IMPRESSION: CT CHEST IMPRESSION  1.  No acute process or evidence of metastatic disease in the chest. 2. Tiny pulmonary nodules, primarily felt to be similar. Some are more conspicuous today secondary to differences in slice collimation. Primarily felt to represent subpleural lymph nodes. 3. Centrilobular emphysema. 4.  Aortic atherosclerosis.  CT ABDOMEN  AND PELVIS IMPRESSION  1. Bladder wall thickening and calcification which could be treatment related or represent residual/recurrent disease. No transmural spread. 2. A right external iliac node which is not pathologic by size criteria but has enlarged since the prior exam. This is suspicious for early nodal metastasis. 3. Left nephrolithiasis. Bilateral nephrostomy catheters in place without hydronephrosis. 4. Although there is no significant hydroureter, a new tiny distal left ureteric stone is identified. 5. Prostatomegaly. 6. Cholelithiasis.   Electronically Signed   By: Abigail Miyamoto M.D.   On: 09/03/2016 09:36    CT scan personally reviewed today  Assessment & Plan:    1. Malignant neoplasm of overlapping sites of bladder  [facility]) History of muscle invasive bladder cancer status post radiation. Evidence of progressive locally advanced disease with possible interval development of nodal metastasis, asymptomatic Declined further local therapy including refusal of TURBT or BCG therapy. May consider palliative biologic chemotherapy as needed should he become symptomatic Will plan for interval imaging in 6 months or sooner if needed - IR Nephrostomy Tube Change; Future  2. Radiation cystitis Severe, refractory Managed with bilateral indwelling nephrostomy tubes, exchanged serially every 3 months Will arrange for next nephrostomy tube exchange  3. Chronic kidney disease (CKD), stage III (moderate) Baseline Cr 1.7   Return in about 6 months (around 03/11/2017) for CT abd prior.  Hollice Espy, MD  [facility] [address] [place],  [address] [phone]

## 2016-09-12 ENCOUNTER — Encounter: Payer: Self-pay | Admitting: Urology

## 2016-10-23 ENCOUNTER — Ambulatory Visit (INDEPENDENT_AMBULATORY_CARE_PROVIDER_SITE_OTHER): Payer: Medicare Other | Admitting: Urology

## 2016-10-23 ENCOUNTER — Encounter: Payer: Self-pay | Admitting: Urology

## 2016-10-23 VITALS — BP 135/81 | HR 73 | Ht 70.0 in | Wt 170.1 lb

## 2016-10-23 DIAGNOSIS — N3040 Irradiation cystitis without hematuria: Secondary | ICD-10-CM

## 2016-10-23 DIAGNOSIS — C678 Malignant neoplasm of overlapping sites of bladder: Secondary | ICD-10-CM

## 2016-10-23 NOTE — Progress Notes (Signed)
   10/23/16  CC:  Chief Complaint  Patient presents with  . Bladder Cancer    problem w/ nephrostomy tube     HPI: 82 year old male who presents today for same day visit given concern for issues with his right nephrostomy tube. He states that over the weekend, his right flank has become slightly more swollen. He has some irritation and redness around his right nephrostomy tube. It was intermittently not draining but appears to be draining well currently.  He denies any fevers or chills. His last tube changes 2 months ago. He was due for another tube exchange in December.  Blood pressure 135/81, pulse 73, height 5\' 10"  (1.778 m), weight 170 lb 1.6 oz (77.2 kg). NED. A&Ox3.   Accompanied by wife and daughter today. No respiratory distress   Abd soft, NT, ND Bilateral nephrostomy tubes in place. Both draining clear yellow urine. No appreciable flank swelling or erythema. There is some slight irritation as well as granulation tissue around the right tube with a somewhat protracted retention suture. No evidence of cellulitis.  Assessment/ Plan:  1. Malignant neoplasm of overlapping sites of bladder [facility]) Untreated bladder cancer  Previous surgery refused palliative TURBT  2. Radiation cystitis Severe radiation cystitis managed with bilateral nephrostomy tubes Due for exchange in December, however given recent issues will arrange for this month    Will arrange for nephrostomy tube exchange in near future  Follow up as previously schedule - 02/2017 with CT abd/ pelvis  Hollice Espy, MD

## 2016-10-23 NOTE — Progress Notes (Signed)
I removed the pt dressing from his nephrostomy tubes. Slight erythema, exudate, and swelling notice on the R side over the top of the nephrostomy tubing.

## 2016-10-24 ENCOUNTER — Telehealth: Payer: Self-pay | Admitting: Radiology

## 2016-10-24 NOTE — Telephone Encounter (Signed)
Notified pt of bilateral nephrostomy tube exchange scheduled 11/08/16 with arrival time to [facility] registration desk of 7:30. Advised pt to be npo after mn except to take enalapril & sotalol with a sip of water. Pt voices understanding.

## 2016-11-05 ENCOUNTER — Ambulatory Visit
Admission: RE | Admit: 2016-11-05 | Discharge: 2016-11-05 | Disposition: A | Discharge status: Home / Self Care | Payer: Medicare Other | Source: Ambulatory Visit | Attending: Urology | Admitting: Urology

## 2016-11-05 DIAGNOSIS — Z466 Encounter for fitting and adjustment of urinary device: Secondary | ICD-10-CM | POA: Insufficient documentation

## 2016-11-05 DIAGNOSIS — Z950 Presence of cardiac pacemaker: Secondary | ICD-10-CM | POA: Diagnosis not present

## 2016-11-05 DIAGNOSIS — Z7982 Long term (current) use of aspirin: Secondary | ICD-10-CM | POA: Diagnosis not present

## 2016-11-05 DIAGNOSIS — I2510 Atherosclerotic heart disease of native coronary artery without angina pectoris: Secondary | ICD-10-CM | POA: Diagnosis not present

## 2016-11-05 DIAGNOSIS — K219 Gastro-esophageal reflux disease without esophagitis: Secondary | ICD-10-CM | POA: Insufficient documentation

## 2016-11-05 DIAGNOSIS — Z87891 Personal history of nicotine dependence: Secondary | ICD-10-CM | POA: Insufficient documentation

## 2016-11-05 DIAGNOSIS — C678 Malignant neoplasm of overlapping sites of bladder: Secondary | ICD-10-CM | POA: Insufficient documentation

## 2016-11-05 DIAGNOSIS — E785 Hyperlipidemia, unspecified: Secondary | ICD-10-CM | POA: Diagnosis not present

## 2016-11-05 DIAGNOSIS — Z880 Allergy status to penicillin: Secondary | ICD-10-CM | POA: Insufficient documentation

## 2016-11-05 DIAGNOSIS — Z79899 Other long term (current) drug therapy: Secondary | ICD-10-CM | POA: Insufficient documentation

## 2016-11-05 DIAGNOSIS — I129 Hypertensive chronic kidney disease with stage 1 through stage 4 chronic kidney disease, or unspecified chronic kidney disease: Secondary | ICD-10-CM | POA: Insufficient documentation

## 2016-11-05 DIAGNOSIS — Z953 Presence of xenogenic heart valve: Secondary | ICD-10-CM | POA: Insufficient documentation

## 2016-11-05 HISTORY — PX: IR GENERIC HISTORICAL: IMG1180011

## 2016-11-05 MED ORDER — SODIUM CHLORIDE 0.9 % IV SOLN
INTRAVENOUS | Status: DC
  Administered 2016-11-05: 11:00:00 via INTRAVENOUS

## 2016-11-05 MED ORDER — MIDAZOLAM HCL 2 MG/2ML IJ SOLN
INTRAMUSCULAR | Status: AC | PRN
  Administered 2016-11-05: 1 mg via INTRAVENOUS

## 2016-11-05 MED ORDER — FENTANYL CITRATE (PF) 100 MCG/2ML IJ SOLN
INTRAMUSCULAR | Status: AC | PRN
  Administered 2016-11-05: 50 ug via INTRAVENOUS

## 2016-11-05 MED ORDER — IOPAMIDOL (ISOVUE-300) INJECTION 61%
30.0000 mL | Freq: Once | INTRAVENOUS | Status: AC | PRN
  Administered 2016-11-05: 15 mL via INTRAVENOUS

## 2016-11-05 MED ORDER — FENTANYL CITRATE (PF) 100 MCG/2ML IJ SOLN
INTRAMUSCULAR | Status: AC
  Filled 2016-11-05: qty 4

## 2016-11-05 MED ORDER — MIDAZOLAM HCL 2 MG/2ML IJ SOLN
INTRAMUSCULAR | Status: AC
  Filled 2016-11-05: qty 4

## 2016-11-05 NOTE — H&P (Signed)
Chief Complaint: Patient was seen for bilateral nephrostomy tube changes at the request of [place]  Referring Physician(s): Brandon,Ashley  Patient Status: [facility] - Out-pt  History of Present Illness: Austin Contreras is a 82 y.o. male established patient with a history of bladder cancer and chronic indwelling PCN's here for routine exchanges of his nephrostomy tubes.  He requests and requires sedation for tube changes due to pain with manipulation of his tubes.  Past Medical History:  Diagnosis Date  . A-fib ([place])   . B12 deficiency anemia 08/02/2015  . Bladder cancer ([place])    stage 2 s/p TUBTR  . BPH (benign prostatic hyperplasia)   . CKD (chronic kidney disease) stage 3, GFR 30-59 ml/min   . Coronary artery disease   . GERD (gastroesophageal reflux disease)   . H/O aortic valve replacement with porcine valve   . Hyperlipidemia   . Hypertension   . PONV (postoperative nausea and vomiting)   . Presence of permanent cardiac pacemaker   . SVT (supraventricular tachycardia) ([facility])     Past Surgical History:  Procedure Laterality Date  . AORTIC VALVE REPLACEMENT    . APPENDECTOMY    . CATARACT EXTRACTION, BILATERAL    . cataracts    . CORONARY ARTERY BYPASS GRAFT    . CYSTOSCOPY WITH FULGERATION Bilateral 12/23/2015   Procedure: CYSTOSCOPY WITH FULGERATION;  Surgeon: Royston Cowper, MD;  Location: [facility] ORS;  Service: Urology;  Laterality: Bilateral;  . HAND SURGERY     after trauma  . IR GENERIC HISTORICAL  09/06/2016   IR NEPHROSTOMY EXCHANGE LEFT 09/06/2016 Greggory Keen, MD [facility]-INTERV RAD  . IR GENERIC HISTORICAL  09/06/2016   IR NEPHROSTOMY EXCHANGE RIGHT 09/06/2016 Greggory Keen, MD [facility]-INTERV RAD  . PACEMAKER INSERTION    . PREPATELLAR BURSA EXCISION    . ROTATOR CUFF REPAIR     right shoulder  . TRANSURETHRAL RESECTION OF BLADDER TUMOR N/A 10/17/2015   Procedure: TRANSURETHRAL RESECTION OF BLADDER TUMOR (TURBT);  Surgeon: Royston Cowper, MD;  Location: [facility]  ORS;  Service: Urology;  Laterality: N/A;    Allergies: Penicillins and Flu virus vaccine  Medications: Prior to Admission medications   Medication Sig Start Date End Date Taking? Authorizing Provider  cyanocobalamin (,VITAMIN B-12,) 1000 MCG/ML injection Inject 1,000 mcg into the muscle every 30 (thirty) days.   Yes Historical Provider, MD  enalapril (VASOTEC) 5 MG tablet Take 5 mg by mouth daily.    Yes Historical Provider, MD  simvastatin (ZOCOR) 10 MG tablet Take 10 mg by mouth at bedtime.    Yes Historical Provider, MD  aspirin EC 81 MG tablet Take by mouth.    Historical Provider, MD  furosemide (LASIX) 20 MG tablet Take by mouth.    Historical Provider, MD  Sodium Chloride Flush (NORMAL SALINE FLUSH) 0.9 % SOLN Inject 10 mLs into the vein QID. 01/03/16   Hollice Espy, MD  sotalol (BETAPACE) 80 MG tablet Take by mouth.    Historical Provider, MD     Family History  Problem Relation Age of Onset  . Lymphoma Sister   . Bone cancer Mother   . CAD Father   . CVA Father   . Prostate cancer Neg Hx   . Bladder Cancer Neg Hx   . Kidney cancer Neg Hx     Social History   Social History  . Marital status: Married    Spouse name: N/A  . Number of children: N/A  . Years of education: N/A  Social History Main Topics  . Smoking status: Former Smoker    Types: Cigarettes    Quit date: 09/07/1979  . Smokeless tobacco: Former Systems developer     Comment: Quit in 1999  . Alcohol use No  . Drug use: No  . Sexual activity: Not Asked   Other Topics Concern  . None   Social History Narrative   Lives at home with wife, very independent. Active at baseline.    Review of Systems: A 12 point ROS discussed and pertinent positives are indicated in the HPI above.  All other systems are negative.  Review of Systems  Constitutional: Negative.   HENT: Negative.   Respiratory: Negative.   Cardiovascular: Negative.   Gastrointestinal: Negative.   Genitourinary: Negative.   Musculoskeletal:  Negative.   Neurological: Negative.     Vital Signs: BP (!) 150/79   Pulse 75   Temp 98 F (36.7 C) (Oral)   Resp 18   SpO2 98%   Physical Exam  Constitutional: He is oriented to person, place, and time. No distress.  HENT:  Head: Normocephalic and atraumatic.  Mouth/Throat: Oropharynx is clear and moist.  Neck: Neck supple. No JVD present.  Cardiovascular: Normal rate, regular rhythm and normal heart sounds.  Exam reveals no gallop and no friction rub.   No murmur heard. Pulmonary/Chest: Effort normal and breath sounds normal. No stridor. No respiratory distress. He has no wheezes. He has no rales.  Abdominal: Soft. Bowel sounds are normal. He exhibits no distension and no mass. There is no tenderness. There is no rebound and no guarding.  Musculoskeletal: He exhibits no edema.  Lymphadenopathy:    He has no cervical adenopathy.  Neurological: He is alert and oriented to person, place, and time.  Skin: He is not diaphoretic.  Vitals reviewed.   Mallampati Score:  MD Evaluation Airway: WNL Heart: WNL Abdomen: WNL Chest/ Lungs: WNL ASA  Classification: 2 Mallampati/Airway Score: One  Imaging: No results found.  Labs:  CBC:  Recent Labs  12/28/15 0517 12/29/15 0634 12/30/15 0512 12/31/15 0441  WBC 6.3 4.6 4.7 5.3  HGB 8.1* 8.1* 8.4* 8.3*  HCT 24.9* 24.7* 26.0* 24.7*  PLT 241 240 271 261    COAGS:  Recent Labs  12/22/15 1055  INR 1.12  APTT 36    BMP:  Recent Labs  12/30/15 0512 12/31/15 0441 01/01/16 0519 01/31/16 1205  NA 141 140 139 140  K 4.7 4.5 4.7 5.3*  CL 114* 113* 111 102  CO2 19* 21* 21* 21  GLUCOSE 99 107* 106* 90  BUN 40* 29* 23* 19  CALCIUM 8.1* 7.9* 7.9* 9.1  CREATININE 2.01* 1.57* 1.54* 1.58*  GFRNONAA 28* 38* 39* 39*  GFRAA 33* 44* 45* 45*    LIVER FUNCTION TESTS:  Recent Labs  12/22/15 1055 12/27/15 0610 01/31/16 1205  BILITOT 1.7* 2.5* 0.4  AST 40 19 11  ALT 124* 32 10  ALKPHOS 104 81 86  PROT 6.9 6.6  6.4  ALBUMIN 3.7 2.9* 3.8    Assessment and Plan:  For exchange/replacement of bilateral nephrostomy tubes today.  Consent obtained.  OK to receive conscious sedation based on exam.  Electronically Signed: Aaliyha Mumford T 11/05/2016, 10:48 AM     I spent a total of 15 Minutes in face to face in clinical consultation, greater than 50% of which was counseling/coordinating care for bilateral nephrostomy tube changes.

## 2016-11-08 ENCOUNTER — Ambulatory Visit: Payer: Medicare Other

## 2016-11-27 ENCOUNTER — Inpatient Hospital Stay: Payer: Medicare Other | Attending: Hematology and Oncology

## 2016-11-27 DIAGNOSIS — I2510 Atherosclerotic heart disease of native coronary artery without angina pectoris: Secondary | ICD-10-CM | POA: Insufficient documentation

## 2016-11-27 DIAGNOSIS — C674 Malignant neoplasm of posterior wall of bladder: Secondary | ICD-10-CM | POA: Insufficient documentation

## 2016-11-27 DIAGNOSIS — Z950 Presence of cardiac pacemaker: Secondary | ICD-10-CM | POA: Diagnosis not present

## 2016-11-27 DIAGNOSIS — I129 Hypertensive chronic kidney disease with stage 1 through stage 4 chronic kidney disease, or unspecified chronic kidney disease: Secondary | ICD-10-CM | POA: Diagnosis not present

## 2016-11-27 DIAGNOSIS — K219 Gastro-esophageal reflux disease without esophagitis: Secondary | ICD-10-CM | POA: Insufficient documentation

## 2016-11-27 DIAGNOSIS — N183 Chronic kidney disease, stage 3 (moderate): Secondary | ICD-10-CM | POA: Insufficient documentation

## 2016-11-27 DIAGNOSIS — E785 Hyperlipidemia, unspecified: Secondary | ICD-10-CM | POA: Insufficient documentation

## 2016-11-27 DIAGNOSIS — Z87891 Personal history of nicotine dependence: Secondary | ICD-10-CM | POA: Insufficient documentation

## 2016-11-27 DIAGNOSIS — Z79899 Other long term (current) drug therapy: Secondary | ICD-10-CM | POA: Diagnosis not present

## 2016-11-27 DIAGNOSIS — Z953 Presence of xenogenic heart valve: Secondary | ICD-10-CM | POA: Diagnosis not present

## 2016-11-27 DIAGNOSIS — E538 Deficiency of other specified B group vitamins: Secondary | ICD-10-CM | POA: Insufficient documentation

## 2016-11-27 DIAGNOSIS — D472 Monoclonal gammopathy: Secondary | ICD-10-CM | POA: Diagnosis not present

## 2016-11-27 DIAGNOSIS — Z7982 Long term (current) use of aspirin: Secondary | ICD-10-CM | POA: Insufficient documentation

## 2016-11-27 DIAGNOSIS — I471 Supraventricular tachycardia: Secondary | ICD-10-CM | POA: Insufficient documentation

## 2016-11-27 DIAGNOSIS — Z951 Presence of aortocoronary bypass graft: Secondary | ICD-10-CM | POA: Insufficient documentation

## 2016-11-27 DIAGNOSIS — I4891 Unspecified atrial fibrillation: Secondary | ICD-10-CM | POA: Insufficient documentation

## 2016-11-27 DIAGNOSIS — N400 Enlarged prostate without lower urinary tract symptoms: Secondary | ICD-10-CM | POA: Diagnosis not present

## 2016-11-27 DIAGNOSIS — Z923 Personal history of irradiation: Secondary | ICD-10-CM | POA: Diagnosis not present

## 2016-11-27 LAB — CBC WITH DIFFERENTIAL/PLATELET
Basophils Absolute: 0 10*3/uL (ref 0–0.1)
Basophils Relative: 0 %
EOS PCT: 1 %
Eosinophils Absolute: 0.1 10*3/uL (ref 0–0.7)
HCT: 32.2 % — ABNORMAL LOW (ref 40.0–52.0)
Hemoglobin: 10.5 g/dL — ABNORMAL LOW (ref 13.0–18.0)
LYMPHS ABS: 1.1 10*3/uL (ref 1.0–3.6)
LYMPHS PCT: 12 %
MCH: 27.7 pg (ref 26.0–34.0)
MCHC: 32.7 g/dL (ref 32.0–36.0)
MCV: 84.8 fL (ref 80.0–100.0)
MONO ABS: 1 10*3/uL (ref 0.2–1.0)
MONOS PCT: 10 %
Neutro Abs: 7.3 10*3/uL — ABNORMAL HIGH (ref 1.4–6.5)
Neutrophils Relative %: 77 %
PLATELETS: 313 10*3/uL (ref 150–440)
RBC: 3.8 MIL/uL — AB (ref 4.40–5.90)
RDW: 16 % — ABNORMAL HIGH (ref 11.5–14.5)
WBC: 9.5 10*3/uL (ref 3.8–10.6)

## 2016-11-27 LAB — IRON AND TIBC
IRON: 17 ug/dL — AB (ref 45–182)
SATURATION RATIOS: 6 % — AB (ref 17.9–39.5)
TIBC: 291 ug/dL (ref 250–450)
UIBC: 275 ug/dL

## 2016-11-27 LAB — COMPREHENSIVE METABOLIC PANEL
ALT: 11 U/L — AB (ref 17–63)
ANION GAP: 9 (ref 5–15)
AST: 14 U/L — ABNORMAL LOW (ref 15–41)
Albumin: 3.6 g/dL (ref 3.5–5.0)
Alkaline Phosphatase: 54 U/L (ref 38–126)
BUN: 28 mg/dL — ABNORMAL HIGH (ref 6–20)
CALCIUM: 8.7 mg/dL — AB (ref 8.9–10.3)
CHLORIDE: 105 mmol/L (ref 101–111)
CO2: 24 mmol/L (ref 22–32)
CREATININE: 2.01 mg/dL — AB (ref 0.61–1.24)
GFR, EST AFRICAN AMERICAN: 32 mL/min — AB (ref >60)
GFR, EST NON AFRICAN AMERICAN: 28 mL/min — AB (ref >60)
Glucose, Bld: 97 mg/dL (ref 65–99)
Potassium: 4 mmol/L (ref 3.5–5.1)
Sodium: 138 mmol/L (ref 135–145)
Total Bilirubin: 0.5 mg/dL (ref 0.3–1.2)
Total Protein: 7.8 g/dL (ref 6.5–8.1)

## 2016-12-02 LAB — PROTEIN ELECTROPHORESIS, SERUM
A/G Ratio: 0.9 (ref 0.7–1.7)
ALBUMIN ELP: 3.4 g/dL (ref 2.9–4.4)
ALPHA-1-GLOBULIN: 0.4 g/dL (ref 0.0–0.4)
Alpha-2-Globulin: 1.1 g/dL — ABNORMAL HIGH (ref 0.4–1.0)
BETA GLOBULIN: 1 g/dL (ref 0.7–1.3)
GAMMA GLOBULIN: 1.4 g/dL (ref 0.4–1.8)
Globulin, Total: 3.9 g/dL (ref 2.2–3.9)
M-Spike, %: 0.6 g/dL — ABNORMAL HIGH
TOTAL PROTEIN ELP: 7.3 g/dL (ref 6.0–8.5)

## 2016-12-04 ENCOUNTER — Ambulatory Visit: Payer: Medicare Other

## 2016-12-06 ENCOUNTER — Inpatient Hospital Stay (HOSPITAL_BASED_OUTPATIENT_CLINIC_OR_DEPARTMENT_OTHER): Payer: Medicare Other | Admitting: Oncology

## 2016-12-06 ENCOUNTER — Encounter: Payer: Self-pay | Admitting: Oncology

## 2016-12-06 VITALS — BP 171/94 | HR 71 | Temp 97.4°F | Resp 18 | Wt 162.8 lb

## 2016-12-06 DIAGNOSIS — Z79899 Other long term (current) drug therapy: Secondary | ICD-10-CM

## 2016-12-06 DIAGNOSIS — C674 Malignant neoplasm of posterior wall of bladder: Secondary | ICD-10-CM | POA: Diagnosis not present

## 2016-12-06 DIAGNOSIS — E538 Deficiency of other specified B group vitamins: Secondary | ICD-10-CM | POA: Diagnosis not present

## 2016-12-06 DIAGNOSIS — D472 Monoclonal gammopathy: Secondary | ICD-10-CM

## 2016-12-06 DIAGNOSIS — D519 Vitamin B12 deficiency anemia, unspecified: Secondary | ICD-10-CM

## 2016-12-06 DIAGNOSIS — Z923 Personal history of irradiation: Secondary | ICD-10-CM | POA: Diagnosis not present

## 2016-12-06 NOTE — Progress Notes (Signed)
Hematology/Oncology Consult note [facility]  [phone] [phone]  Patient Care Team: Kirk Ruths, MD as PCP - General (Internal Medicine)   Name of the patient: Austin Contreras  [id number]  Jun 26, 1926   Date of visit: 12/06/16  Diagnosis- 1. 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 B12 deficiency 3. IgG MGUS    Chief complaint/ Reason for visit- routine follow-up for about medical problems   Heme/Onc history: Patient is a 82 year old male with multiple comorbidities including coronary artery disease, COPD, BPH, A. fib and history of bladder cancer. He had initially presented with one-year history of hematuria starting in 2014 and underwent CT scan of the abdomen which shows abnormalities in the bladder wall. He had cystoscopy done which showed papillary bladder tumor involving the posterior wall as well as carcinoma in situ. 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 There was evidence of lymphovascular invasion as well as at least subepithelial connective tissue involvement. He also underwent radiation therapy but refused chemotherapy at that time. Currently he has bilateral nephrostomy tubes in place and follows up with urology regularly. Last CT scan from September 2017 showed small stable pulmonary nodules. Bladder wall thickening and calcification which could be treatment related versus residual/recurrent disease. Enlarged external iliac node which was new and suspicious for early nodal metastases. He has a repeat CT abdomen scheduled in March 2018.  Patient also has a history of B12 deficiency and gets monthly B12 injections with his primary care doctor. He was also found to have IgG MGUS which is monitored on a yearly basis  Interval history- patient continues to  have problems with pelvic pain since his prior treatment. Otherwise he reports doing well for his age and does not want to see too many doctors at this time  ECOG PS- 2 Pain scale- 3 chronic pelvic pain - Opioid associated constipation- no  Review of systems- Review of Systems  Constitutional: Negative for chills, fever, malaise/fatigue and weight loss.       Elderly frail gentleman who does not appear in any acute distress  HENT: Negative for congestion, ear discharge and nosebleeds.   Eyes: Negative for blurred vision.  Respiratory: Negative for cough, hemoptysis, sputum production, shortness of breath and wheezing.   Cardiovascular: Negative for chest pain, palpitations, orthopnea and claudication.  Gastrointestinal: Negative for abdominal pain, blood in stool, constipation, diarrhea, heartburn, melena, nausea and vomiting.  Genitourinary: Negative for dysuria, flank pain, frequency, hematuria and urgency.  Musculoskeletal: Negative for back pain, joint pain and myalgias.  Skin: Negative for rash.  Neurological: Negative for dizziness, tingling, focal weakness, seizures, weakness and headaches.  Endo/Heme/Allergies: Does not bruise/bleed easily.  Psychiatric/Behavioral: Negative for depression and suicidal ideas. The patient does not have insomnia.      Current treatment- observation   Allergies  Allergen Reactions  . Penicillins Rash and Other (See Comments)    Has patient had a PCN reaction causing immediate rash, facial/tongue/throat swelling, SOB or lightheadedness with hypotension: No Has patient had a PCN reaction causing severe rash involving mucus membranes or skin necrosis: No Has patient had a PCN reaction that required hospitalization No Has patient had a PCN reaction occurring within the last 10 years: No If all of the above answers are "NO", then may proceed with Cephalosporin use.  Marland Kitchen Flu Virus Vaccine Other (  See Comments)    Times 3     Past Medical History:    Diagnosis Date  . A-fib ([place])   . B12 deficiency anemia 08/02/2015  . Bladder cancer ([place])    stage 2 s/p TUBTR  . BPH (benign prostatic hyperplasia)   . CKD (chronic kidney disease) stage 3, GFR 30-59 ml/min   . Coronary artery disease   . GERD (gastroesophageal reflux disease)   . H/O aortic valve replacement with porcine valve   . Hyperlipidemia   . Hypertension   . PONV (postoperative nausea and vomiting)   . Presence of permanent cardiac pacemaker   . SVT (supraventricular tachycardia) (HCC)      Past Surgical History:  Procedure Laterality Date  . AORTIC VALVE REPLACEMENT    . APPENDECTOMY    . CATARACT EXTRACTION, BILATERAL    . cataracts    . CORONARY ARTERY BYPASS GRAFT    . CYSTOSCOPY WITH FULGERATION Bilateral 12/23/2015   Procedure: CYSTOSCOPY WITH FULGERATION;  Surgeon: Royston Cowper, MD;  Location: [facility] ORS;  Service: Urology;  Laterality: Bilateral;  . HAND SURGERY     after trauma  . IR GENERIC HISTORICAL  09/06/2016   IR NEPHROSTOMY EXCHANGE LEFT 09/06/2016 Greggory Keen, MD [facility]  . IR GENERIC HISTORICAL  09/06/2016   IR NEPHROSTOMY EXCHANGE RIGHT 09/06/2016 Greggory Keen, MD [facility]  . IR GENERIC HISTORICAL  11/05/2016   IR NEPHROSTOMY TUBE CHANGE 11/05/2016 Aletta Edouard, MD [facility]  . PACEMAKER INSERTION    . PREPATELLAR BURSA EXCISION    . ROTATOR CUFF REPAIR     right shoulder  . TRANSURETHRAL RESECTION OF BLADDER TUMOR N/A 10/17/2015   Procedure: TRANSURETHRAL RESECTION OF BLADDER TUMOR (TURBT);  Surgeon: Royston Cowper, MD;  Location: [facility] ORS;  Service: Urology;  Laterality: N/A;    Social History   Social History  . Marital status: Married    Spouse name: N/A  . Number of children: N/A  . Years of education: N/A   Occupational History  . Not on file.   Social History Main Topics  . Smoking status: Former Smoker    Types: Cigarettes    Quit date: 09/07/1979  . Smokeless tobacco: Former Systems developer     Comment: Quit  in 1999  . Alcohol use No  . Drug use: No  . Sexual activity: Not on file   Other Topics Concern  . Not on file   Social History Narrative   Lives at home with wife, very independent. Active at baseline.    Family History  Problem Relation Age of Onset  . Lymphoma Sister   . Bone cancer Mother   . CAD Father   . CVA Father   . Prostate cancer Neg Hx   . Bladder Cancer Neg Hx   . Kidney cancer Neg Hx      Current Outpatient Prescriptions:  .  aspirin EC 81 MG tablet, Take by mouth., Disp: , Rfl:  .  cyanocobalamin (,VITAMIN B-12,) 1000 MCG/ML injection, Inject 1,000 mcg into the muscle every 30 (thirty) days., Disp: , Rfl:  .  enalapril (VASOTEC) 5 MG tablet, Take 5 mg by mouth daily. , Disp: , Rfl:  .  furosemide (LASIX) 20 MG tablet, Take by mouth., Disp: , Rfl:  .  simvastatin (ZOCOR) 10 MG tablet, Take 10 mg by mouth at bedtime. , Disp: , Rfl:  .  Sodium Chloride Flush (NORMAL SALINE FLUSH) 0.9 % SOLN, Inject 10 mLs into the vein  QID., Disp: 120 Syringe, Rfl: 3 .  sotalol (BETAPACE) 80 MG tablet, Take by mouth., Disp: , Rfl:   Physical exam:  Vitals:   12/06/16 1117  BP: (!) 171/94  Pulse: 71  Resp: 18  Temp: 97.4 F (36.3 C)  TempSrc: Tympanic  Weight: 162 lb 13 oz (73.9 kg)   Physical Exam  Constitutional: He is oriented to person, place, and time and well-developed, well-nourished, and in no distress.  He is elderly and frail  HENT:  Head: Normocephalic and atraumatic.  Eyes: EOM are normal. Pupils are equal, round, and reactive to light.  Neck: Normal range of motion.  Cardiovascular: Normal rate, regular rhythm and normal heart sounds.   Pulmonary/Chest: Effort normal and breath sounds normal.  Abdominal: Soft. Bowel sounds are normal.  Bilateral nephrostomy dooms in place draining clear urine  Neurological: He is alert and oriented to person, place, and time.  Skin: Skin is warm and dry.     CMP Latest Ref Rng & Units 11/27/2016  Glucose 65 - 99  mg/dL 97  BUN 6 - 20 mg/dL 28(H)  Creatinine 0.61 - 1.24 mg/dL 2.01(H)  Sodium 135 - 145 mmol/L 138  Potassium 3.5 - 5.1 mmol/L 4.0  Chloride 101 - 111 mmol/L 105  CO2 22 - 32 mmol/L 24  Calcium 8.9 - 10.3 mg/dL 8.7(L)  Total Protein 6.5 - 8.1 g/dL 7.8  Total Bilirubin 0.3 - 1.2 mg/dL 0.5  Alkaline Phos 38 - 126 U/L 54  AST 15 - 41 U/L 14(L)  ALT 17 - 63 U/L 11(L)   CBC Latest Ref Rng & Units 11/27/2016  WBC 3.8 - 10.6 K/uL 9.5  Hemoglobin 13.0 - 18.0 g/dL 10.5(L)  Hematocrit 40.0 - 52.0 % 32.2(L)  Platelets 150 - 440 K/uL 313     Assessment and plan- Patient is a 82 y.o. male who sees [place] for the following medical issues:  1. Recurrent bladder cancer- this is being followed up by urology Dr. Erlene Quan and has a repeat CT abdomen scheduled in March 2018. Findings of bladder wall thickening and probably nodal metastases is concerning. However patient is elderly and frail and not interested in chemotherapy options if it were to come to it. Certainly immunotherapy can be considered depending on his performance status. He can be referred to [place] sooner if there are any concerning findings on CT abdomen for further discussion.   2. B12 deficiency-continue B12 shots with primary care doctor's office  3. IgG MGUS -monoclonal M protein was 0.6 g  and has not significantly changed. We will continue to monitor this on a yearly basis    Visit Diagnosis 1. Monoclonal gammopathy   2. Anemia due to vitamin B12 deficiency, unspecified B12 deficiency type   3. Malignant neoplasm of posterior wall of urinary bladder (HCC)      Dr. Randa Evens, MD, MPH [facility] Pager- [phone] 12/06/2016 8:59 AM

## 2016-12-06 NOTE — Progress Notes (Signed)
Patient is here for follow up, he is doing ok.  

## 2016-12-26 ENCOUNTER — Other Ambulatory Visit: Payer: Self-pay | Admitting: Urology

## 2016-12-26 ENCOUNTER — Other Ambulatory Visit: Payer: Self-pay | Admitting: Radiology

## 2016-12-26 ENCOUNTER — Telehealth: Payer: Self-pay

## 2016-12-26 DIAGNOSIS — C679 Malignant neoplasm of bladder, unspecified: Secondary | ICD-10-CM

## 2016-12-26 DIAGNOSIS — N3040 Irradiation cystitis without hematuria: Secondary | ICD-10-CM

## 2016-12-26 NOTE — Telephone Encounter (Signed)
Pt wife called stating pt neph tubes have been getting clogged a lot here lately. Wife stated that she thinks its time for neph tubes to be changed. Made wife aware will send message to surgery scheduler. Wife voiced understanding.

## 2016-12-26 NOTE — Telephone Encounter (Signed)
Pt's wife states neph tubes have been getting clogged despite flushing them. Advised wife that we will get bilateral neph tube exchanges scheduled but encouraged her to continue flushing them in the meantime per Dr Erlene Quan. Wife voices understanding.

## 2016-12-27 NOTE — Telephone Encounter (Signed)
Called to notify pt of bilateral nephrostomy tube exchange scheduled 01/10/17 @8 :00 based on pt's request to have an early morning appt. Pt requests an appt sooner regardless of time of day. Will contact pt with new appt once scheduled. Pt voices understanding.

## 2016-12-27 NOTE — Telephone Encounter (Signed)
Notified pt's wife of bilateral nephrostomy tube exchange scheduled 01/02/17 with arrival time to [facility] registration desk of 8:00. Advised pt to be npo after mn except to take enalapril & sotalol with a sip of water the morning of the procedure. Wife voices understanding & appreciation.

## 2017-01-02 ENCOUNTER — Ambulatory Visit: Admission: RE | Admit: 2017-01-02 | Payer: Medicare Other | Source: Ambulatory Visit

## 2017-01-08 ENCOUNTER — Other Ambulatory Visit: Payer: Self-pay | Admitting: Radiology

## 2017-01-09 ENCOUNTER — Ambulatory Visit
Admission: RE | Admit: 2017-01-09 | Discharge: 2017-01-09 | Disposition: A | Discharge status: Home / Self Care | Payer: Medicare Other | Source: Ambulatory Visit | Attending: Urology | Admitting: Urology

## 2017-01-09 ENCOUNTER — Other Ambulatory Visit (HOSPITAL_COMMUNITY): Payer: Medicare Other

## 2017-01-09 ENCOUNTER — Encounter: Payer: Self-pay | Admitting: Interventional Radiology

## 2017-01-09 ENCOUNTER — Other Ambulatory Visit: Payer: Medicare Other

## 2017-01-09 DIAGNOSIS — N3040 Irradiation cystitis without hematuria: Secondary | ICD-10-CM | POA: Insufficient documentation

## 2017-01-09 DIAGNOSIS — Z466 Encounter for fitting and adjustment of urinary device: Secondary | ICD-10-CM | POA: Diagnosis not present

## 2017-01-09 HISTORY — PX: IR GENERIC HISTORICAL: IMG1180011

## 2017-01-09 MED ORDER — MIDAZOLAM HCL 5 MG/5ML IJ SOLN
INTRAMUSCULAR | Status: AC | PRN
  Administered 2017-01-09: 0.5 mg via INTRAVENOUS
  Administered 2017-01-09: 1 mg via INTRAVENOUS

## 2017-01-09 MED ORDER — LIDOCAINE HCL (PF) 1 % IJ SOLN
INTRAMUSCULAR | Status: AC | PRN
  Administered 2017-01-09: 10 mL

## 2017-01-09 MED ORDER — SODIUM CHLORIDE 0.9 % IV SOLN
Freq: Once | INTRAVENOUS | Status: AC
  Administered 2017-01-09: 12:00:00 via INTRAVENOUS

## 2017-01-09 MED ORDER — FENTANYL CITRATE (PF) 100 MCG/2ML IJ SOLN
INTRAMUSCULAR | Status: AC | PRN
  Administered 2017-01-09: 25 ug via INTRAVENOUS
  Administered 2017-01-09: 50 ug via INTRAVENOUS
  Administered 2017-01-09: 25 ug via INTRAVENOUS

## 2017-01-09 MED ORDER — IOPAMIDOL (ISOVUE-300) INJECTION 61%: 10.0000 mL | Freq: Once | INTRAVENOUS | Status: DC | PRN

## 2017-01-09 NOTE — Progress Notes (Signed)
Pt stabler home. tol proc well.  Dressing bilat dry and intact upon discharge. Family has dc instructions. And understands. Pt home with family stable.

## 2017-01-09 NOTE — Progress Notes (Signed)
Pt resting quietly in bed. tol lunch and po well. No co pain . Dressings dry and intact.

## 2017-01-09 NOTE — Procedures (Signed)
Exchange bilateral perc neph tubes under fluoro No complication No blood loss. See complete dictation in [facility].

## 2017-01-13 ENCOUNTER — Other Ambulatory Visit: Payer: Self-pay | Admitting: Radiology

## 2017-01-13 ENCOUNTER — Encounter: Payer: Self-pay | Admitting: Urology

## 2017-01-13 DIAGNOSIS — N3040 Irradiation cystitis without hematuria: Secondary | ICD-10-CM

## 2017-01-25 ENCOUNTER — Other Ambulatory Visit: Payer: Self-pay | Admitting: Urology

## 2017-01-25 DIAGNOSIS — T83092A Other mechanical complication of nephrostomy catheter, initial encounter: Secondary | ICD-10-CM

## 2017-01-31 ENCOUNTER — Emergency Department: Payer: Medicare Other

## 2017-01-31 ENCOUNTER — Emergency Department
Admission: EM | Admit: 2017-01-31 | Discharge: 2017-01-31 | Disposition: A | Discharge status: Home / Self Care | Payer: Medicare Other | Attending: Emergency Medicine | Admitting: Emergency Medicine

## 2017-01-31 ENCOUNTER — Encounter: Payer: Self-pay | Admitting: Emergency Medicine

## 2017-01-31 DIAGNOSIS — R0602 Shortness of breath: Secondary | ICD-10-CM | POA: Diagnosis not present

## 2017-01-31 DIAGNOSIS — I2510 Atherosclerotic heart disease of native coronary artery without angina pectoris: Secondary | ICD-10-CM | POA: Insufficient documentation

## 2017-01-31 DIAGNOSIS — N183 Chronic kidney disease, stage 3 (moderate): Secondary | ICD-10-CM | POA: Insufficient documentation

## 2017-01-31 DIAGNOSIS — Z87891 Personal history of nicotine dependence: Secondary | ICD-10-CM | POA: Insufficient documentation

## 2017-01-31 DIAGNOSIS — Z8551 Personal history of malignant neoplasm of bladder: Secondary | ICD-10-CM | POA: Insufficient documentation

## 2017-01-31 DIAGNOSIS — R627 Adult failure to thrive: Secondary | ICD-10-CM | POA: Diagnosis not present

## 2017-01-31 DIAGNOSIS — C799 Secondary malignant neoplasm of unspecified site: Secondary | ICD-10-CM

## 2017-01-31 DIAGNOSIS — R079 Chest pain, unspecified: Secondary | ICD-10-CM

## 2017-01-31 DIAGNOSIS — Z951 Presence of aortocoronary bypass graft: Secondary | ICD-10-CM | POA: Insufficient documentation

## 2017-01-31 DIAGNOSIS — Z79899 Other long term (current) drug therapy: Secondary | ICD-10-CM | POA: Insufficient documentation

## 2017-01-31 DIAGNOSIS — I129 Hypertensive chronic kidney disease with stage 1 through stage 4 chronic kidney disease, or unspecified chronic kidney disease: Secondary | ICD-10-CM | POA: Diagnosis not present

## 2017-01-31 DIAGNOSIS — I481 Persistent atrial fibrillation: Secondary | ICD-10-CM | POA: Diagnosis not present

## 2017-01-31 DIAGNOSIS — R531 Weakness: Secondary | ICD-10-CM

## 2017-01-31 HISTORY — DX: Polyneuropathy, unspecified: G62.9

## 2017-01-31 HISTORY — DX: Personal history of other diseases of the digestive system: Z87.19

## 2017-01-31 HISTORY — DX: Personal history of urinary calculi: Z87.442

## 2017-01-31 HISTORY — DX: Paroxysmal atrial fibrillation: I48.0

## 2017-01-31 HISTORY — DX: Nonrheumatic mitral (valve) insufficiency: I34.0

## 2017-01-31 LAB — URINALYSIS, ROUTINE W REFLEX MICROSCOPIC
BILIRUBIN URINE: NEGATIVE
Glucose, UA: NEGATIVE mg/dL
HGB URINE DIPSTICK: NEGATIVE
Ketones, ur: NEGATIVE mg/dL
NITRITE: NEGATIVE
PROTEIN: 100 mg/dL — AB
SPECIFIC GRAVITY, URINE: 1.04 — AB (ref 1.005–1.030)
pH: 6 (ref 5.0–8.0)

## 2017-01-31 LAB — CBC
HEMATOCRIT: 26 % — AB (ref 40.0–52.0)
Hemoglobin: 8.6 g/dL — ABNORMAL LOW (ref 13.0–18.0)
MCH: 26.9 pg (ref 26.0–34.0)
MCHC: 32.9 g/dL (ref 32.0–36.0)
MCV: 81.7 fL (ref 80.0–100.0)
Platelets: 410 10*3/uL (ref 150–440)
RBC: 3.18 MIL/uL — AB (ref 4.40–5.90)
RDW: 17.8 % — ABNORMAL HIGH (ref 11.5–14.5)
WBC: 9.6 10*3/uL (ref 3.8–10.6)

## 2017-01-31 LAB — BASIC METABOLIC PANEL
Anion gap: 9 (ref 5–15)
BUN: 28 mg/dL — ABNORMAL HIGH (ref 6–20)
CHLORIDE: 102 mmol/L (ref 101–111)
CO2: 25 mmol/L (ref 22–32)
Calcium: 8.5 mg/dL — ABNORMAL LOW (ref 8.9–10.3)
Creatinine, Ser: 1.75 mg/dL — ABNORMAL HIGH (ref 0.61–1.24)
GFR calc non Af Amer: 33 mL/min — ABNORMAL LOW (ref >60)
GFR, EST AFRICAN AMERICAN: 38 mL/min — AB (ref >60)
Glucose, Bld: 97 mg/dL (ref 65–99)
POTASSIUM: 4 mmol/L (ref 3.5–5.1)
SODIUM: 136 mmol/L (ref 135–145)

## 2017-01-31 LAB — TROPONIN I
Troponin I: 0.03 ng/mL (ref <0.03)
Troponin I: 0.05 ng/mL (ref <0.03)

## 2017-01-31 MED ORDER — SODIUM CHLORIDE 0.9 % IV BOLUS (SEPSIS)
500.0000 mL | Freq: Once | INTRAVENOUS | Status: AC
  Administered 2017-01-31: 500 mL via INTRAVENOUS

## 2017-01-31 MED ORDER — IOPAMIDOL (ISOVUE-370) INJECTION 76%
60.0000 mL | Freq: Once | INTRAVENOUS | Status: AC | PRN
  Administered 2017-01-31: 60 mL via INTRAVENOUS

## 2017-01-31 NOTE — ED Provider Notes (Signed)
[facility] Emergency Department Provider Note  ____________________________________________   First MD Initiated Contact with Patient 01/31/17 1142     (approximate)  I have reviewed the triage vital signs and the nursing notes.   HISTORY  Chief Complaint Chest Pain   HPI Austin Contreras is a 83 y.o. male with a history of bladder cancer being treated palliatively by Dr. Erlene Quan who is presenting to the emergency department today with 1 week of weakness as well as atrial fibrillation. He had follow-up in the office with cardiology, Dr.Khan, for his weakness and was found to have a heart rate in the 160s to 200s. He was doubled on his sotalol. The patient is not on any anticoagulation. He did not have any palpitations but says that he does have chest pain when he takes a deep breath. At this time he is pain-free and is denying any shortness of breath or palpitations. However, the patient was concerned because of the worsening weakness and came to the hospital for evaluation.   Past Medical History:  Diagnosis Date  . B12 deficiency anemia 08/02/2015  . Bladder cancer ([place])    a. stage 2 s/p TUBTR;  b. s/p bilateral nephrostomy tubes.  Marland Kitchen BPH (benign prostatic hyperplasia)   . CKD (chronic kidney disease) stage 3, GFR 30-59 ml/min   . Coronary artery disease    a. 08/2012 s/p CABG x 2 (VG->RCA, VG->OM1) @ [facility];  b. H/o stress test some time after CABG (pt unsure of date) -> reportedly normal.  . GERD (gastroesophageal reflux disease)   . H/O aortic valve replacement with porcine valve    a. 08/2012 46mm Trifecta Biologic Valve.  Marland Kitchen History of GI bleed    a. ~ 2011 - required 6 units PRBC's.  . History of nephrolithiasis   . Hyperlipidemia   . Hypertension   . Mitral regurgitation   . PAF (paroxysmal atrial fibrillation) ([place])    a. Developed post-op AVR/CABG in 08/2012.  Initially treated with amio but did not tolerate.  On sotalol since late 2013.  No OAC  despite CHA2DS2VASc = 4 secondary to h/o GIB and chronic hematuria in the setting of bladder cancer s/p nephrostomy tubes.  . Peripheral neuropathy ([place])   . PONV (postoperative nausea and vomiting)   . Presence of permanent cardiac pacemaker    a. 08/2012 developed heart block/junctional bradycardia following CABG/AVR-->s/p MDT CRM Advisa DR MRI SureScan [id number] Dual Chamber PPM, ser # [id number].  . SVT (supraventricular tachycardia) [facility])     Patient Active Problem List   Diagnosis Date Noted  . Encounter for general adult medical examination without abnormal findings 04/03/2016  . Acute blood loss anemia   . Attention to nephrostomy ([place])   . Hematuria 12/22/2015  . [place] anemia 08/17/2015  . Malignant neoplasm of urinary bladder ([place]) 08/17/2015  . B12 deficiency anemia 08/02/2015  . Bladder cancer ([place]) 08/02/2015  . Paroxysmal atrial fibrillation ([place]) 09/08/2012  . Pulmonary hypertension 08/30/2012  . Atrial fibrillation ([place]) 08/27/2012  . Chronic kidney disease (CKD), stage III (moderate) 08/27/2012  . Artificial cardiac pacemaker 08/27/2012  . Aortic heart valve narrowing 08/21/2012  . H/O aortic valve replacement 08/21/2012  . Acid reflux 08/21/2012  . MI (mitral incompetence) 08/21/2012  . Benign fibroma of prostate 08/20/2012  . Arteriosclerosis of coronary artery 08/20/2012  . HLD (hyperlipidemia) 08/20/2012  . BP (high blood pressure) 08/20/2012  . Supraventricular tachycardia ([place]) 08/20/2012    Past Surgical History:  Procedure Laterality Date  .  AORTIC VALVE REPLACEMENT    . APPENDECTOMY  1942  . BACK SURGERY  1991  . CATARACT EXTRACTION, BILATERAL    . cataracts    . CORONARY ARTERY BYPASS GRAFT  08/2012  . CYSTOSCOPY WITH FULGERATION Bilateral 12/23/2015   Procedure: CYSTOSCOPY WITH FULGERATION;  Surgeon: Royston Cowper, MD;  Location: [facility] ORS;  Service: Urology;  Laterality: Bilateral;  . HAND SURGERY  1976   after trauma  . IR GENERIC HISTORICAL   09/06/2016   IR NEPHROSTOMY EXCHANGE LEFT 09/06/2016 Greggory Keen, MD [facility]INTERV RAD  . IR GENERIC HISTORICAL  09/06/2016   IR NEPHROSTOMY EXCHANGE RIGHT 09/06/2016 Greggory Keen, MD [facility]  . IR GENERIC HISTORICAL  11/05/2016   IR NEPHROSTOMY TUBE CHANGE 11/05/2016 Aletta Edouard, MD [facility]  . IR GENERIC HISTORICAL  01/09/2017   IR NEPHROSTOMY EXCHANGE LEFT 01/09/2017 Arne Cleveland, MD [facility]  . IR GENERIC HISTORICAL  01/09/2017   IR NEPHROSTOMY EXCHANGE RIGHT 01/09/2017 Arne Cleveland, MD [facility]  . IR GENERIC HISTORICAL  01/16/2016   IR NEPHROSTOMY EXCHANGE RIGHT 01/16/2016 [facility] OUT REF  . PACEMAKER INSERTION    . PREPATELLAR BURSA EXCISION    . ROTATOR CUFF REPAIR  1995   right shoulder  . TRANSURETHRAL RESECTION OF BLADDER TUMOR N/A 10/17/2015   Procedure: TRANSURETHRAL RESECTION OF BLADDER TUMOR (TURBT);  Surgeon: Royston Cowper, MD;  Location: [facility] ORS;  Service: Urology;  Laterality: N/A;  . TURP VAPORIZATION  2010    Prior to Admission medications   Medication Sig Start Date End Date Taking? Authorizing Provider  acetaminophen (TYLENOL) 500 MG tablet Take 500 mg by mouth every 6 (six) hours as needed.   Yes Historical Provider, MD  azelastine (ASTELIN) 0.1 % nasal spray Place 2 sprays into both nostrils 2 (two) times daily. Use in each nostril as directed   Yes Historical Provider, MD  azithromycin (ZITHROMAX) 250 MG tablet Take 250-500 mg by mouth daily. tk 2 tabs on day 1, ten 1 tab on days 2-5 01/29/17 02/02/17 Yes Historical Provider, MD  cholecalciferol (VITAMIN D) 1000 units tablet Take 1,000 Units by mouth daily.   Yes Historical Provider, MD  clindamycin (CLEOCIN) 150 MG capsule Take 150 mg by mouth 3 (three) times daily.  11/27/16  Yes Historical Provider, MD  cyanocobalamin (,VITAMIN B-12,) 1000 MCG/ML injection Inject 1,000 mcg into the muscle every 30 (thirty) days.   Yes Historical Provider, MD  enalapril (VASOTEC) 5 MG tablet Take 5 mg  by mouth daily.    Yes Historical Provider, MD  furosemide (LASIX) 20 MG tablet Take 20 mg by mouth daily as needed.    Yes Historical Provider, MD  simvastatin (ZOCOR) 10 MG tablet Take 10 mg by mouth at bedtime.    Yes Historical Provider, MD  sotalol (BETAPACE) 80 MG tablet Take 80 mg by mouth daily.    Yes Historical Provider, MD  Sodium Chloride Flush (NORMAL SALINE FLUSH) 0.9 % SOLN use 10 milliliter SALINE FLUSHES AS DIRECTED 01/26/17   Nori Riis, PA-C    Allergies Penicillins and Flu virus vaccine  Family History  Problem Relation Age of Onset  . Lymphoma Sister   . Bone cancer Mother   . CAD Father   . CVA Father   . Prostate cancer Neg Hx   . Bladder Cancer Neg Hx   . Kidney cancer Neg Hx     Social History Social History  Substance Use Topics  . Smoking status: Former Smoker  Types: Cigarettes  . Smokeless tobacco: Former Systems developer     Comment: 1992  . Alcohol use No    Review of Systems Constitutional: No fever/chills Eyes: No visual changes. ENT: No sore throat. Cardiovascular: as above Respiratory: Denies shortness of breath. Gastrointestinal: No abdominal pain.  No nausea, no vomiting.  No diarrhea.  No constipation. Genitourinary: Negative for dysuria. Musculoskeletal: Negative for back pain. Skin: Negative for rash. Neurological: Negative for headaches, focal weakness or numbness. 10-point ROS otherwise negative.  ____________________________________________   PHYSICAL EXAM:  VITAL SIGNS: ED Triage Vitals [01/31/17 1106]  Enc Vitals Group     BP (!) 112/59     Pulse Rate 70     Resp 20     Temp 98.9 F (37.2 C)     Temp Source Oral     SpO2 96 %     Weight 162 lb (73.5 kg)     Height      Head Circumference      Peak Flow      Pain Score      Pain Loc      Pain Edu?      Excl. in [place]?     Constitutional: Alert and oriented. Well appearing and in no acute distress. Eyes: Conjunctivae are normal. PERRL. EOMI. Head:  Atraumatic. Nose: No congestion/rhinnorhea. Mouth/Throat: Mucous membranes are moist.  Oropharynx non-erythematous. Neck: No stridor.   Cardiovascular: Normal rate, regular rhythm. Grossly normal heart sounds.   Respiratory: Normal respiratory effort.  No retractions. Lungs CTAB. Gastrointestinal: Soft with mild suprapubic tenderness to palpation which the patient says is chronic and unchanged. No distention. No abdominal bruits. No CVA tenderness.  Bilateral nephrostomies with incisions clean dry and intact to the low back.  Musculoskeletal: No lower extremity tenderness nor edema.  No joint effusions. Neurologic:  Normal speech and language. No gross focal neurologic deficits are appreciated.  Skin:  Skin is warm, dry and intact. No rash noted. Psychiatric: Mood and affect are normal. Speech and behavior are normal.  ____________________________________________   LABS (all labs ordered are listed, but only abnormal results are displayed)  Labs Reviewed  BASIC METABOLIC PANEL - Abnormal; Notable for the following:       Result Value   BUN 28 (*)    Creatinine, Ser 1.75 (*)    Calcium 8.5 (*)    GFR calc non Af Amer 33 (*)    GFR calc Af Amer 38 (*)    All other components within normal limits  CBC - Abnormal; Notable for the following:    RBC 3.18 (*)    Hemoglobin 8.6 (*)    HCT 26.0 (*)    RDW 17.8 (*)    All other components within normal limits  TROPONIN I - Abnormal; Notable for the following:    Troponin I 0.03 (*)    All other components within normal limits  TROPONIN I   ____________________________________________  EKG  ED ECG REPORT I, Doran Stabler, the attending physician, personally viewed and interpreted this ECG.   Date: 01/31/2017  EKG Time: 1105  Rate: 76  Rhythm: atrial paced rhythm  Axis: normal  Intervals:prolonged qt  ST&T Change: no st elevation or depression.  No abnormal t wave  inversion  ____________________________________________  RADIOLOGY  CT Angio Chest PE W and/or Wo Contrast (Final result)  Result time 01/31/17 12:38:24  Final result by Rolm Baptise, MD (01/31/17 [id number])           Narrative:  CLINICAL DATA: Chest pain with deep inspiration. History of bladder cancer.  EXAM: CT ANGIOGRAPHY CHEST WITH CONTRAST  TECHNIQUE: Multidetector CT imaging of the chest was performed using the standard protocol during bolus administration of intravenous contrast. Multiplanar CT image reconstructions and MIPs were obtained to evaluate the vascular anatomy.  CONTRAST: 60 cc Isovue 370 IV  COMPARISON: 09/03/2016  FINDINGS: Cardiovascular: No filling defects in the pulmonary arteries to suggest pulmonary emboli. Heart is mildly enlarged. Aorta is normal caliber. Scattered aortic and coronary artery calcifications. Prior CABG.  Mediastinum/Nodes: Mild right hilar adenopathy with right hilar lymph node measuring up to 2 cm in short axis diameter on image 49. Small and borderline sized mediastinal lymph nodes. No axillary adenopathy.  Lungs/Pleura: Innumerable to bilateral pulmonary nodules compatible with extensive metastases. These are new or enlarged since prior study. Index left upper lobe nodule on image 24 measures 2 cm in greatest dimension. Index posterior right lower lobe nodule/ mass on image 50 measures up to 3.5 cm. No pleural effusions.  Upper Abdomen: Imaging into the upper abdomen shows no acute findings.  Musculoskeletal: Left pacer in place. No acute bony abnormality.  Review of the MIP images confirms the above findings.  IMPRESSION: Innumerable bilateral pulmonary nodules and masses compatible with extensive metastatic disease.  No evidence of pulmonary embolus.  Mild cardiomegaly.  Right hilar adenopathy.   Electronically Signed By: Rolm Baptise M.D. On: 01/31/2017 12:38            DG Chest 2 View  (Final result)  Result time 01/31/17 11:47:49  Final result by Jennette Banker, MD (01/31/17 11:47:49)           Narrative:   CLINICAL DATA: Mid chest pain, shortness of breath, valve replacement  EXAM: CHEST 2 VIEW  COMPARISON: 12/25/2015  FINDINGS: There are numerous bilateral pulmonary nodules in the upper and lower lungs, right greater than left of varying sizes. There is no focal consolidation. There is no pleural effusion or pneumothorax. The heart and mediastinal contours are unremarkable. There is evidence of prior CABG.  The osseous structures are unremarkable.  IMPRESSION: Numerous bilateral pulmonary nodules in the upper lower lungs, right greater than left of varying sizes. These are new compared with 09/03/2016. Differential considerations include metastatic disease versus septic emboli versus fungal disease.   Electronically Signed By: Kathreen Devoid On: 01/31/2017 11:47            ____________________________________________   PROCEDURES  Procedure(s) performed:   Procedures  Critical Care performed:   ____________________________________________   INITIAL IMPRESSION / ASSESSMENT AND PLAN / ED COURSE  Pertinent labs & imaging results that were available during my care of the patient were reviewed by me and considered in my medical decision making (see chart for details).  ----------------------------------------- 1:27 PM on 01/31/2017 -----------------------------------------  Patient without any tenderness over the nephrostomy sites. Sediment in the urine. Patient as well as family updated about likely metastatic disease to the lungs. Pending phone call from oncology, Dr. Janese Banks at this time. Also spoke with Dr. Fletcher Anon who will be seeing the patient in consultation.  Clinical Course as of Jan 31 1623  Fri Jan 31, 2017  1409 Custom case with Dr. Janese Banks of the oncology service who arranged an appointment for the patient to be seen on  February 20 at 2:30 PM.  [DS]    Clinical Course User Index [DS] Orbie Pyo, MD   ----------------------------------------- 4:24 PM on 01/31/2017 -----------------------------------------  Patient resting a little bit this  time. Second troponin at 0.05. I discussed this with Dr. Fletcher Anon who says that he will schedule the patient for an outpatient echocardiogram. I explained this to the patient as well as the family. They're understanding of this plan and willing to comply. Understand the diagnosis of likely metastatic cancer will also be following up with the cancer Center this coming Tuesday.  ____________________________________________   FINAL CLINICAL IMPRESSION(S) / ED DIAGNOSES  Chest pain. Metastatic cancer. Weakness.    NEW MEDICATIONS STARTED DURING THIS VISIT:  New Prescriptions   No medications on file     Note:  This document was prepared using Dragon voice recognition software and may include unintentional dictation errors.    Orbie Pyo, MD 01/31/17 [phone]

## 2017-01-31 NOTE — ED Notes (Signed)
Per family, pacemaker had been running 160's HR for past few days. Pt was seen by cardiology his week and increased his meds.

## 2017-01-31 NOTE — ED Notes (Signed)
Patient transported to X-ray 

## 2017-01-31 NOTE — ED Notes (Signed)

## 2017-01-31 NOTE — ED Notes (Signed)
Pt asked for something to drink, and complained of his mouth being dry; this RN provided pt with Sprite. Pt expressed gratitude.

## 2017-01-31 NOTE — Consult Note (Signed)
Cardiology Consult    Patient ID: Austin Contreras MRN: [id number], DOB/AGE: 08-30-1926   Admit date: 01/31/2017 Date of Consult: 01/31/2017  Primary Physician: Austin Contreras., MD Primary Cardiologist: Previously Austin Contreras  pt to f/u with M. Fletcher Anon, MD  Requesting Provider: Jerilynn Mages. Ouida Sills, MD  Patient Profile    83 y/o ? with a h/o CAD and AS s/p CABG x 2 and bioprosthetic AVR in 2013, bladder cancer s/p TURB and nephrostomy tubes, PAF, GIB (preventing use of oral anticoagulation), HTN, HL, jxnl bradycardia s/p MDT PPM, and CKD III, who presented to the [facility] ED with a one month h/o progressive dyspnea and fatigue.  Past Medical History   Past Medical History:  Diagnosis Date  . B12 deficiency anemia 08/02/2015  . Bladder cancer ([place])    a. stage 2 s/p TUBTR;  b. s/p bilateral nephrostomy tubes.  Marland Kitchen BPH (benign prostatic hyperplasia)   . CKD (chronic kidney disease) stage 3, GFR 30-59 ml/min   . Coronary artery disease    a. 08/2012 s/p CABG x 2 (VG->RCA, VG->OM1) @ [facility];  b. H/o stress test some time after CABG (pt unsure of date) -> reportedly normal.  . GERD (gastroesophageal reflux disease)   . H/O aortic valve replacement with porcine valve    a. 08/2012 15mm Trifecta Biologic Valve.  Marland Kitchen History of GI bleed    a. ~ 2011 - required 6 units PRBC's.  . History of nephrolithiasis   . Hyperlipidemia   . Hypertension   . Mitral regurgitation   . PAF (paroxysmal atrial fibrillation) ([place])    a. Developed post-op AVR/CABG in 08/2012.  Initially treated with amio but did not tolerate.  On sotalol since late 2013.  No OAC despite CHA2DS2VASc = 4 secondary to h/o GIB and chronic hematuria in the setting of bladder cancer s/p nephrostomy tubes.  . Peripheral neuropathy ([place])   . PONV (postoperative nausea and vomiting)   . Presence of permanent cardiac pacemaker    a. 08/2012 developed heart block/junctional bradycardia following CABG/AVR-->s/p MDT CRM Advisa DR MRI SureScan [id number] Dual  Chamber PPM, ser # [id number] H.  . SVT (supraventricular tachycardia) ([place])     Past Surgical History:  Procedure Laterality Date  . AORTIC VALVE REPLACEMENT    . APPENDECTOMY  1942  . BACK SURGERY  1991  . CATARACT EXTRACTION, BILATERAL    . cataracts    . CORONARY ARTERY BYPASS GRAFT  08/2012  . CYSTOSCOPY WITH FULGERATION Bilateral 12/23/2015   Procedure: CYSTOSCOPY WITH FULGERATION;  Surgeon: Austin Cowper, MD;  Location: [facility] ORS;  Service: Urology;  Laterality: Bilateral;  . HAND SURGERY  1976   after trauma  . IR GENERIC HISTORICAL  09/06/2016   IR NEPHROSTOMY EXCHANGE LEFT 09/06/2016 Austin Keen, MD [facility]  . IR GENERIC HISTORICAL  09/06/2016   IR NEPHROSTOMY EXCHANGE RIGHT 09/06/2016 Austin Keen, MD [facility]  . IR GENERIC HISTORICAL  11/05/2016   IR NEPHROSTOMY TUBE CHANGE 11/05/2016 Austin Edouard, MD [facility]  . IR GENERIC HISTORICAL  01/09/2017   IR NEPHROSTOMY EXCHANGE LEFT 01/09/2017 Austin Cleveland, MD [facility]  . IR GENERIC HISTORICAL  01/09/2017   IR NEPHROSTOMY EXCHANGE RIGHT 01/09/2017 Austin Cleveland, MD [facility]  . IR GENERIC HISTORICAL  01/16/2016   IR NEPHROSTOMY EXCHANGE RIGHT 01/16/2016 Austin Contreras  . PACEMAKER INSERTION    . PREPATELLAR BURSA EXCISION    . ROTATOR CUFF REPAIR  1995   right shoulder  . TRANSURETHRAL RESECTION OF BLADDER TUMOR  N/A 10/17/2015   Procedure: TRANSURETHRAL RESECTION OF BLADDER TUMOR (TURBT);  Surgeon: Austin Cowper, MD;  Location: [facility] ORS;  Service: Urology;  Laterality: N/A;  . TURP VAPORIZATION  2010     Allergies  Allergies  Allergen Reactions  . Penicillins Rash and Other (See Comments)    Has patient had a PCN reaction causing immediate rash, facial/tongue/throat swelling, SOB or lightheadedness with hypotension: No Has patient had a PCN reaction causing severe rash involving mucus membranes or skin necrosis: No Has patient had a PCN reaction that required hospitalization No Has  patient had a PCN reaction occurring within the last 10 years: No If all of the above answers are "NO", then may proceed with Cephalosporin use.  Marland Kitchen Flu Virus Vaccine Other (See Comments)    Times 3    History of Present Illness    83 y/o ? with the above complex PMH including bladder cancer s/p TURB and bilateral nephrostomy tubes, HTN, HL, BPH s/p TURP, anemia,6 unit GIB, and CKD III.  Cardiac hx dates back to 2013 when he was evaluated @ [facility] due to a long h/o murmur and was found to have severe AS and also 2 vessel CAD.  He underwent CABG x 2 and bioprosthetic AVR in 08/2012.  Post-op course was complicated by post-op Afib and jxnl bradycardia requiring MDT PPM.  He was d/c'd on amiodarone but did not tolerate and was subsequently placed on Sotalol.  He has been followed by Austin Contreras @ [facility] since prior to his surgery.  He is not on long term anticoagulation 2/2 prior h/o GIB.  Pt says he thinks he had a stress test within a year or two of bypass surgery, and believes that it was normal.  He has not required repeat cath.  Pt lives locally with his wife. He has been active and continues to tinker in his wood shop.  Beginning about a month ago however, he began to experience progressive fatigue, reduced exercise tolerance, DOE, anorexia, early satiety, and intermittent pleuritic chest pain.  He has also occasionally noted something different about his heart rhythm.  He can't really describe it further.  He saw Austin Contreras on 2/14 for a routine device interrogation and was told that he had been having afib over the preceding 6 days.  His sotalol dose was increased (? 80 mg BID).  He also saw primary care and reported Ss in addition to productive cough occurring each morning.  CBC was performed and showed elev WBC @ 13.6.  There was concern for infxn and he was placed on a Z pack.  Austin Contreras continued to feel poorly and was advised by another doctor to present to the ED today.  We were contacted  and asked to consult by his outpatient MD as pt would like to change cardiology providers.  Currently Mr. Fecko is hemodynamically stable and in sinus rhythm w/ A pacing on demand.    Home Medications    Prior to Admission medications   Medication Sig Start Date End Date Taking? Authorizing Provider  acetaminophen (TYLENOL) 500 MG tablet Take 500 mg by mouth every 6 (six) hours as needed.   Yes Historical Provider, MD  azelastine (ASTELIN) 0.1 % nasal spray Place 2 sprays into both nostrils 2 (two) times daily. Use in each nostril as directed   Yes Historical Provider, MD  azithromycin (ZITHROMAX) 250 MG tablet Take 250-500 mg by mouth daily. tk 2 tabs on day 1, ten 1 tab on  days 2-5 01/29/17 02/02/17 Yes Historical Provider, MD  cholecalciferol (VITAMIN D) 1000 units tablet Take 1,000 Units by mouth daily.   Yes Historical Provider, MD  clindamycin (CLEOCIN) 150 MG capsule Take 150 mg by mouth 3 (three) times daily.  11/27/16  Yes Historical Provider, MD  cyanocobalamin (,VITAMIN B-12,) 1000 MCG/ML injection Inject 1,000 mcg into the muscle every 30 (thirty) days.   Yes Historical Provider, MD  enalapril (VASOTEC) 5 MG tablet Take 5 mg by mouth daily.    Yes Historical Provider, MD  furosemide (LASIX) 20 MG tablet Take 20 mg by mouth daily as needed.    Yes Historical Provider, MD  simvastatin (ZOCOR) 10 MG tablet Take 10 mg by mouth at bedtime.    Yes Historical Provider, MD  sotalol (BETAPACE) 80 MG tablet Take 80 mg by mouth daily.    Yes Historical Provider, MD  Sodium Chloride Flush (NORMAL SALINE FLUSH) 0.9 % SOLN use 10 milliliter SALINE FLUSHES AS DIRECTED 01/26/17   Nori Riis, PA-C   Family History    Family History  Problem Relation Age of Onset  . Lymphoma Sister   . Bone cancer Mother   . CAD Father   . CVA Father   . Prostate cancer Neg Hx   . Bladder Cancer Neg Hx   . Kidney cancer Neg Hx     Social History    Social History   Social History  . Marital status:  Married    Spouse name: N/A  . Number of children: N/A  . Years of education: N/A   Occupational History  . Not on file.   Social History Main Topics  . Smoking status: Former Smoker    Types: Cigarettes  . Smokeless tobacco: Former Systems developer     Comment: 1992  . Alcohol use No  . Drug use: No  . Sexual activity: Not on file   Other Topics Concern  . Not on file   Social History Narrative   Lives at home with wife, very independent. Active at baseline.  Retired from [employer]. 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     Review of Systems    General:  No chills, fever, night sweats or weight changes.  Cardiovascular:  +++ pleuritic chest pain, +++ dyspnea on exertion, no edema, orthopnea, palpitations, paroxysmal nocturnal dyspnea. Dermatological: No rash, lesions/masses Respiratory: +++ productive cough in the AM only, +++ dyspnea Urologic: +++ mild hematuria, no dysuria Abdominal:   +++ anorexia.  No nausea, vomiting, diarrhea, bright red blood per rectum, melena, or hematemesis Neurologic:  No visual changes, +++ generalized wkns, no changes in mental status. All other systems reviewed and are otherwise negative except as noted above.  Physical Exam    Blood pressure 122/71, pulse 70, temperature 98.9 F (37.2 C), temperature source Oral, resp. rate (!) 21, weight 162 lb (73.5 kg), SpO2 97 %.  General: Pleasant, NAD Psych: Normal affect. Neuro: Alert and oriented X 3. Moves all extremities spontaneously. HEENT: Normal  Neck: Supple without bruits or JVD. Lungs:  Resp regular and unlabored, diminished breath sounds right base, otw CTA. Heart: RRR no s3, s4, 3/6 blowing quality systolic murmur heard @ LLSB  axillary area. Abdomen: Soft, non-tender, non-distended, BS + x 4.  Extremities: No clubbing, cyanosis or edema. DP/PT/Radials 2+ and equal bilaterally.  Labs     Recent Labs  01/31/17 1104  TROPONINI 0.03*   Lab Results  Component Value Date   WBC 9.6  01/31/2017  HGB 8.6 (L) 01/31/2017   HCT 26.0 (L) 01/31/2017   MCV 81.7 01/31/2017   PLT 410 01/31/2017     Recent Labs Lab 01/31/17 1104  NA 136  K 4.0  CL 102  CO2 25  BUN 28*  CREATININE 1.75*  CALCIUM 8.5*  GLUCOSE 97   Lab Results  Component Value Date   CHOL 136 12/19/2013   HDL 38 (L) 12/19/2013   LDLCALC 77 12/19/2013   TRIG 107 12/19/2013     Radiology Studies    Dg Chest 2 View  Result Date: 01/31/2017 CLINICAL DATA:  Mid chest pain, shortness of breath, valve replacement EXAM: CHEST  2 VIEW COMPARISON:  12/25/2015 FINDINGS: There are numerous bilateral pulmonary nodules in the upper and lower lungs, right greater than left of varying sizes. There is no focal consolidation. There is no pleural effusion or pneumothorax. The heart and mediastinal contours are unremarkable. There is evidence of prior CABG. The osseous structures are unremarkable. IMPRESSION: Numerous bilateral pulmonary nodules in the upper lower lungs, right greater than left of varying sizes. These are new compared with 09/03/2016. Differential considerations include metastatic disease versus septic emboli versus fungal disease. Electronically Signed   By: Kathreen Devoid   On: 01/31/2017 11:47   Ct Angio Chest Pe W And/or Wo Contrast  Result Date: 01/31/2017 CLINICAL DATA:  Chest pain with deep inspiration. History of bladder cancer. EXAM: CT ANGIOGRAPHY CHEST WITH CONTRAST TECHNIQUE: Multidetector CT imaging of the chest was performed using the standard protocol during bolus administration of intravenous contrast. Multiplanar CT image reconstructions and MIPs were obtained to evaluate the vascular anatomy. CONTRAST:  60 cc Isovue 370 IV COMPARISON:  09/03/2016 FINDINGS: Cardiovascular: No filling defects in the pulmonary arteries to suggest pulmonary emboli. Heart is mildly enlarged. Aorta is normal caliber. Scattered aortic and coronary artery calcifications. Prior CABG. Mediastinum/Nodes: Mild right  hilar adenopathy with right hilar lymph node measuring up to 2 cm in short axis diameter on image 49. Small and borderline sized mediastinal lymph nodes. No axillary adenopathy. Lungs/Pleura: Innumerable to bilateral pulmonary nodules compatible with extensive metastases. These are new or enlarged since prior study. Index left upper lobe nodule on image 24 measures 2 cm in greatest dimension. Index posterior right lower lobe nodule/ mass on image 50 measures up to 3.5 cm. No pleural effusions. Upper Abdomen: Imaging into the upper abdomen shows no acute findings. Musculoskeletal: Left pacer in place.  No acute bony abnormality. Review of the MIP images confirms the above findings. IMPRESSION: Innumerable bilateral pulmonary nodules and masses compatible with extensive metastatic disease. No evidence of pulmonary embolus. Mild cardiomegaly. Right hilar adenopathy. Electronically Signed   By: Rolm Baptise AustinD.   On: 01/31/2017 12:38   ECG & Cardiac Imaging    Sinus rhythm, PAC's and atrial pacing on demand.  Assessment & Plan    1.  Failure to thrive:  Pt presents with a one month history of progressive dyspnea, fatigue, anorexia, early satiety, wt loss, and pleuritic chest pain.  He was told by his previous cardiologist that he was having more afib and his sotalol dose was increased 2 days ago. He is in sinus/Apaced today.  He has noted increased cloudiness to urine in nephrostomy bags and has also had AM productive cough.  WBC recently noted to be elevated by PCP on 2/14, and he was started on zithromax.  He was referred to the ED today due to ongoing symptoms.  H/H low but relatively stable, renal fxn/lytes  stable. Trop mildly elevated @ 0.03.  CXR with pulm nodules and f/u CT with innumerable bilat pulm nodules and masses concerning for extensive metastatic disease. Suspect this may be driving his Ss.  Defer to IM/oncology.  Check UA.  2.  PAF:  H/o post-op afib dating back to 08/2012.  Initially managed  with amio but did not tolerate and has thus been on sotalol 80 daily since late 2013.  Recently noted on device interrogation to have increased burden of Af with RVR.  Sotalol was increased by Austin Contreras.  CHA2DS2VASc = 4, however not on [place] 2/2 h/o GIB and mild hematuria in setting of bilateral nephrostomy tubes.  He is in sinus/A paced today.  Cont sotalol.  ? Role of more frequent PAF in symptoms recently.    3.  CAD/mild troponin elevation:  Pt has been having pleuritic c/p recently.  No exertional c/p in the absence of deep breaths. ECG non-acute.  Initial trop 0.03.  Suspect demand ischemia in the setting of above.  Rec f/u echo.  Cont statin, acei,  blocker (sotalol).  4.  HTN:  Stable.  5.  CKD III: stable currently. Watch for CIN given CTA chest today.  6.  Anemia:  H/H down slightly.  Follow.  7.  Metastatic Lung Cancer:  See #1.  Noted on CTA today.  8.  H/o bioprosthetic AVR:  F/u echo.  9.  Mitral regurgitation:  MR noted on exam.  Family confirms prior h/o MR and he was scheduled to have outpt echo w/ Austin Contreras next week.  If admitted, obtain while in house, otw can arrange as outpt.  10.  Bladder cancer s/p nephrostomy tubes:  Cloudy urine recently.  Check UA.  Signed, Murray Hodgkins, NP 01/31/2017, 1:37 PM

## 2017-01-31 NOTE — ED Triage Notes (Signed)
Pt with chest pain on and off for two days. Pt with afib and has pacemaker.

## 2017-02-01 LAB — URINE CULTURE

## 2017-02-03 ENCOUNTER — Telehealth: Payer: Self-pay | Admitting: Cardiovascular Disease

## 2017-02-03 ENCOUNTER — Telehealth: Payer: Self-pay | Admitting: Urology

## 2017-02-03 ENCOUNTER — Other Ambulatory Visit: Payer: Self-pay

## 2017-02-03 DIAGNOSIS — I480 Paroxysmal atrial fibrillation: Secondary | ICD-10-CM

## 2017-02-03 NOTE — Telephone Encounter (Signed)
Yes, I think it's important for staging purposes. I would like this to be done ASAP and schedule follow up sooner than scheduled in light of chest imaging please.  Hollice Espy

## 2017-02-03 NOTE — Telephone Encounter (Addendum)
"  Seen in ED today for A-fib and sent home. Schedule echo and follow up next week."  Called pt who requests I review information with this wife. Echo 2/27, 10:30am Appt w/Dr. Fletcher Anon 2/27, 3:45pm.  Provided directions to the office and reviewed echo test w/wife who verbalized understanding and is agreeable w/appts. She had no questions at this time.

## 2017-02-03 NOTE — Telephone Encounter (Signed)
Patient had a chest CT scan in the ED and his wife is asking if he still needs the CT scan you ordered? I say yes but before they move forward I would like you to advise on this. Can you please take a look at the CT scan he had done and let me know for sure so that I can get back with the scheduling dept. And let them know what to do.  Thanks,  Sharyn Lull

## 2017-02-04 ENCOUNTER — Encounter: Payer: Self-pay | Admitting: Oncology

## 2017-02-04 ENCOUNTER — Inpatient Hospital Stay: Payer: Medicare Other

## 2017-02-04 ENCOUNTER — Inpatient Hospital Stay: Payer: Medicare Other | Attending: Oncology | Admitting: Oncology

## 2017-02-04 VITALS — BP 121/79 | HR 70 | Temp 96.5°F | Resp 18 | Wt 154.1 lb

## 2017-02-04 DIAGNOSIS — I129 Hypertensive chronic kidney disease with stage 1 through stage 4 chronic kidney disease, or unspecified chronic kidney disease: Secondary | ICD-10-CM | POA: Insufficient documentation

## 2017-02-04 DIAGNOSIS — I2510 Atherosclerotic heart disease of native coronary artery without angina pectoris: Secondary | ICD-10-CM | POA: Insufficient documentation

## 2017-02-04 DIAGNOSIS — I340 Nonrheumatic mitral (valve) insufficiency: Secondary | ICD-10-CM | POA: Insufficient documentation

## 2017-02-04 DIAGNOSIS — C674 Malignant neoplasm of posterior wall of bladder: Secondary | ICD-10-CM | POA: Diagnosis not present

## 2017-02-04 DIAGNOSIS — E538 Deficiency of other specified B group vitamins: Secondary | ICD-10-CM | POA: Insufficient documentation

## 2017-02-04 DIAGNOSIS — C7802 Secondary malignant neoplasm of left lung: Secondary | ICD-10-CM | POA: Insufficient documentation

## 2017-02-04 DIAGNOSIS — Z87442 Personal history of urinary calculi: Secondary | ICD-10-CM | POA: Diagnosis not present

## 2017-02-04 DIAGNOSIS — E785 Hyperlipidemia, unspecified: Secondary | ICD-10-CM | POA: Diagnosis not present

## 2017-02-04 DIAGNOSIS — N183 Chronic kidney disease, stage 3 (moderate): Secondary | ICD-10-CM | POA: Insufficient documentation

## 2017-02-04 DIAGNOSIS — D631 Anemia in chronic kidney disease: Secondary | ICD-10-CM | POA: Insufficient documentation

## 2017-02-04 DIAGNOSIS — Z87891 Personal history of nicotine dependence: Secondary | ICD-10-CM | POA: Diagnosis not present

## 2017-02-04 DIAGNOSIS — Z953 Presence of xenogenic heart valve: Secondary | ICD-10-CM | POA: Diagnosis not present

## 2017-02-04 DIAGNOSIS — J449 Chronic obstructive pulmonary disease, unspecified: Secondary | ICD-10-CM | POA: Insufficient documentation

## 2017-02-04 DIAGNOSIS — C7800 Secondary malignant neoplasm of unspecified lung: Secondary | ICD-10-CM

## 2017-02-04 DIAGNOSIS — Z950 Presence of cardiac pacemaker: Secondary | ICD-10-CM | POA: Diagnosis not present

## 2017-02-04 DIAGNOSIS — K219 Gastro-esophageal reflux disease without esophagitis: Secondary | ICD-10-CM | POA: Diagnosis not present

## 2017-02-04 DIAGNOSIS — D472 Monoclonal gammopathy: Secondary | ICD-10-CM | POA: Diagnosis not present

## 2017-02-04 DIAGNOSIS — C7801 Secondary malignant neoplasm of right lung: Secondary | ICD-10-CM | POA: Diagnosis not present

## 2017-02-04 DIAGNOSIS — I480 Paroxysmal atrial fibrillation: Secondary | ICD-10-CM | POA: Diagnosis not present

## 2017-02-04 DIAGNOSIS — Z951 Presence of aortocoronary bypass graft: Secondary | ICD-10-CM | POA: Insufficient documentation

## 2017-02-04 DIAGNOSIS — I471 Supraventricular tachycardia: Secondary | ICD-10-CM | POA: Insufficient documentation

## 2017-02-04 DIAGNOSIS — Z7189 Other specified counseling: Secondary | ICD-10-CM | POA: Insufficient documentation

## 2017-02-04 DIAGNOSIS — Z923 Personal history of irradiation: Secondary | ICD-10-CM | POA: Insufficient documentation

## 2017-02-04 DIAGNOSIS — Z79899 Other long term (current) drug therapy: Secondary | ICD-10-CM

## 2017-02-04 DIAGNOSIS — N400 Enlarged prostate without lower urinary tract symptoms: Secondary | ICD-10-CM | POA: Insufficient documentation

## 2017-02-04 LAB — RETICULOCYTES
RBC.: 3.36 MIL/uL — ABNORMAL LOW (ref 4.40–5.90)
Retic Count, Absolute: 47 10*3/uL (ref 19.0–183.0)
Retic Ct Pct: 1.4 % (ref 0.4–3.1)

## 2017-02-04 LAB — CBC WITH DIFFERENTIAL/PLATELET
Basophils Absolute: 0.1 10*3/uL (ref 0–0.1)
Basophils Relative: 1 %
Eosinophils Absolute: 0.1 10*3/uL (ref 0–0.7)
Eosinophils Relative: 1 %
HEMATOCRIT: 27.5 % — AB (ref 40.0–52.0)
Hemoglobin: 8.9 g/dL — ABNORMAL LOW (ref 13.0–18.0)
LYMPHS PCT: 14 %
Lymphs Abs: 1.5 10*3/uL (ref 1.0–3.6)
MCH: 26.3 pg (ref 26.0–34.0)
MCHC: 32.2 g/dL (ref 32.0–36.0)
MCV: 81.8 fL (ref 80.0–100.0)
MONO ABS: 0.9 10*3/uL (ref 0.2–1.0)
MONOS PCT: 9 %
NEUTROS ABS: 8 10*3/uL — AB (ref 1.4–6.5)
Neutrophils Relative %: 75 %
Platelets: 471 10*3/uL — ABNORMAL HIGH (ref 150–440)
RBC: 3.36 MIL/uL — ABNORMAL LOW (ref 4.40–5.90)
RDW: 17.9 % — AB (ref 11.5–14.5)
WBC: 10.6 10*3/uL (ref 3.8–10.6)

## 2017-02-04 LAB — IRON AND TIBC
Iron: 15 ug/dL — ABNORMAL LOW (ref 45–182)
SATURATION RATIOS: 6 % — AB (ref 17.9–39.5)
TIBC: 253 ug/dL (ref 250–450)
UIBC: 238 ug/dL

## 2017-02-04 LAB — COMPREHENSIVE METABOLIC PANEL
ALT: 18 U/L (ref 17–63)
ANION GAP: 8 (ref 5–15)
AST: 20 U/L (ref 15–41)
Albumin: 3.1 g/dL — ABNORMAL LOW (ref 3.5–5.0)
Alkaline Phosphatase: 89 U/L (ref 38–126)
BILIRUBIN TOTAL: 0.2 mg/dL — AB (ref 0.3–1.2)
BUN: 20 mg/dL (ref 6–20)
CO2: 26 mmol/L (ref 22–32)
Calcium: 8.9 mg/dL (ref 8.9–10.3)
Chloride: 103 mmol/L (ref 101–111)
Creatinine, Ser: 1.58 mg/dL — ABNORMAL HIGH (ref 0.61–1.24)
GFR, EST AFRICAN AMERICAN: 43 mL/min — AB (ref >60)
GFR, EST NON AFRICAN AMERICAN: 37 mL/min — AB (ref >60)
Glucose, Bld: 94 mg/dL (ref 65–99)
POTASSIUM: 4.6 mmol/L (ref 3.5–5.1)
Sodium: 137 mmol/L (ref 135–145)
TOTAL PROTEIN: 7.6 g/dL (ref 6.5–8.1)

## 2017-02-04 LAB — FERRITIN: FERRITIN: 155 ng/mL (ref 24–336)

## 2017-02-04 LAB — FOLATE: Folate: 10.9 ng/mL (ref >5.9)

## 2017-02-04 LAB — VITAMIN B12: VITAMIN B 12: 697 pg/mL (ref 180–914)

## 2017-02-04 NOTE — Progress Notes (Signed)
START ON PATHWAY REGIMEN - Bladder  [id number]: Pembrolizumab 200 mg q21 Days Until Progression,  Unacceptable Toxicity, or up to 24 Months    A cycle is 21 days:     Pembrolizumab [facility])) 200 mg flat dose in 50 mL NS IV over 30 minutes every 21 days.  Inline filter required (low-protein binding)       Dose Mod: None         Additional Orders: Severe immune-mediated reactions can occur. See prescribing information for more details and required immediate management with steroids. Monitor thyroid, renal, liver function tests, glucose, and sodium at baseline and before  each dose of pembrolizumab. Ref: Keytruda(R) (pembrolizumab) prescribing information, 2016.  **Always confirm dose/schedule in your pharmacy ordering system**    Patient Characteristics: Metastatic Disease, First Line, No Prior Neoadjuvant/Adjuvant Therapy AJCC Stage Grouping: IV Current evidence of distant metastases? Yes AJCC T Stage: X AJCC M Stage: X AJCC N Stage: X Line of therapy: First Line Would you be surprised if this patient died  in the next year? I would NOT be surprised if this patient died in the next year Prior Neoadjuvant/Adjuvant Therapy? No  Intent of Therapy: Non-Curative / Palliative Intent, Discussed with Patient

## 2017-02-04 NOTE — Progress Notes (Signed)
Here for f/u. Pt went to ER on 01/31/17-  Pt weak and has pace maker so pt not aware per family.  In ER confirmed w a fib- and  Did cxr and CT-found lung leision    S/b Dr Humphrey Rolls told he had a fib. (01/30/17 )  The plan is to follow w Dr Fletcher Anon /cardiologist.

## 2017-02-04 NOTE — Progress Notes (Signed)
Hematology/Oncology Consult note [facility]  Telephone:([phone] [phone]  Patient Care Team: Kirk Ruths, MD as PCP - General (Internal Medicine)   Name of the patient: Austin Contreras  [id number]  February 04, 1926   Date of visit: 02/04/17  Diagnosis- . 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 B12 deficiency 3. IgG MGUS   Chief complaint/ Reason for visit- discuss management options after recent CT scan  Heme/Onc history: Patient is a 83 year old male with multiple comorbidities including coronary artery disease, COPD, BPH, A. fib and history of bladder cancer. He had initially presented with one-year history of hematuria starting in 2014 and underwent CT scan of the abdomen which shows abnormalities in the bladder wall. He had cystoscopy done which showed papillary bladder tumor involving the posterior wall as well as carcinoma in situ. 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 There was evidence of lymphovascular invasion as well as at least subepithelial connective tissue involvement. He also underwent radiation therapy but refused chemotherapy at that time. Currently he has bilateral nephrostomy tubes in place and follows up with urology regularly. Last CT scan from September 2017 showed small stable pulmonary nodules. Bladder wall thickening and calcification which could be treatment related versus residual/recurrent disease. Enlarged external iliac node which was new and suspicious for early nodal metastases. He has a repeat CT abdomen scheduled in March 2018.  Patient was last seen by me on 12/06/2016 and at that point did not want to pursue his bladder cancer further as he was not interested in getting any chemotherapy. I also discussed the possibility of immunotherapy but he  did not desire any further workup at that time   Interval history- Patient presented to the ER with some symptoms of pleuritic chest pain and underwent CT chest which showed bilateral lung lesions consistent with metastatic disease. At baseline patient is fairly active and can mow his own lawn and go golfing until about 2-3 weeks ago when he started feeling more fatigued. He was also found to be in A. fib when his pacemaker monitor was checked and her sotalol dose was increased. Patient currently follows up with cardiology for the same. He also sees urology for bilateral nephrostomy tubes that were placed given scarring of his urinary bladder in the past and his kidney functions have actually stent. Patient has also been seen by [place] for anemia in the past which has been slowly getting worse.   ECOG PS- 1 Pain scale- 0 Opioid associated constipation- no  Review of systems- Review of Systems  Constitutional: Positive for malaise/fatigue. Negative for chills, fever and weight loss.  HENT: Negative for congestion, ear discharge and nosebleeds.   Eyes: Negative for blurred vision.  Respiratory: Negative for cough, hemoptysis, sputum production, shortness of breath and wheezing.   Cardiovascular: Negative for chest pain, palpitations, orthopnea and claudication.  Gastrointestinal: Negative for abdominal pain, blood in stool, constipation, diarrhea, heartburn, melena, nausea and vomiting.  Genitourinary: Negative for dysuria, flank pain, frequency, hematuria and urgency.  Musculoskeletal: Negative for back pain, joint pain and myalgias.  Skin: Negative for rash.  Neurological: Negative for dizziness, tingling, focal weakness, seizures, weakness and headaches.  Endo/Heme/Allergies: Does not bruise/bleed easily.  Psychiatric/Behavioral: Negative for depression and suicidal ideas. The patient does not have insomnia.      Current treatment- observation  Allergies  Allergen Reactions  . Penicillins  Rash and Other (See Comments)    Has patient had a PCN reaction causing immediate rash, facial/tongue/throat swelling, SOB or lightheadedness with hypotension: No Has patient had a PCN reaction causing severe rash involving mucus membranes or skin necrosis: No Has patient had a PCN reaction that required hospitalization No Has patient had a PCN reaction occurring within the last 10 years: No If all of the above answers are "NO", then may proceed with Cephalosporin use.  Marland Kitchen Flu Virus Vaccine Other (See Comments)    Times 3     Past Medical History:  Diagnosis Date  . B12 deficiency anemia 08/02/2015  . Bladder cancer ([place])    a. stage 2 s/p TUBTR;  b. s/p bilateral nephrostomy tubes.  Marland Kitchen BPH (benign prostatic hyperplasia)   . CKD (chronic kidney disease) stage 3, GFR 30-59 ml/min   . Coronary artery disease    a. 08/2012 s/p CABG x 2 (VG->RCA, VG->OM1) @ [facility];  b. H/o stress test some time after CABG (pt unsure of date) -> reportedly normal.  . GERD (gastroesophageal reflux disease)   . H/O aortic valve replacement with porcine valve    a. 08/2012 43m Trifecta Biologic Valve.  .Marland KitchenHistory of GI bleed    a. ~ 2011 - required 6 units PRBC's.  . History of nephrolithiasis   . Hyperlipidemia   . Hypertension   . Mitral regurgitation   . PAF ([place]oxysmal atrial fibrillation) ([place]    a. Developed post-op AVR/CABG in 08/2012.  Initially treated with amio but did not tolerate.  On sotalol since late 2013.  No OAC despite CHA2DS2VASc = 4 secondary to h/o GIB and chronic hematuria in the setting of bladder cancer s/p nephrostomy tubes.  . Peripheral neuropathy [place]   . PONV (postoperative nausea and vomiting)   . Presence of permanent cardiac pacemaker    a. 08/2012 developed heart block/junctional bradycardia following CABG/AVR-->s/p MDT CRM Advisa DR MRI SureScan [id number] Dual Chamber PPM, ser # [id number].  . SVT (supraventricular tachycardia) ([place]      Past Surgical History:  Procedure  Laterality Date  . AORTIC VALVE REPLACEMENT    . APPENDECTOMY  1942  . BACK SURGERY  1991  . CATARACT EXTRACTION, BILATERAL    . cataracts    . CORONARY ARTERY BYPASS GRAFT  08/2012  . CYSTOSCOPY WITH FULGERATION Bilateral 12/23/2015   Procedure: CYSTOSCOPY WITH FULGERATION;  Surgeon: MRoyston Cowper MD;  Location: [facility] ORS;  Service: Urology;  Laterality: Bilateral;  . HAND SURGERY  1976   after trauma  . IR GENERIC HISTORICAL  09/06/2016   IR NEPHROSTOMY EXCHANGE LEFT 09/06/2016 MGreggory Keen MD [facility]INTERV RAD  . IR GENERIC HISTORICAL  09/06/2016   IR NEPHROSTOMY EXCHANGE RIGHT 09/06/2016 MGreggory Keen MD [facility]  . IR GENERIC HISTORICAL  11/05/2016   IR NEPHROSTOMY TUBE CHANGE 11/05/2016 GAletta Edouard MD [facility]INTER[facility]  . IR GENERIC HISTORICAL  01/09/2017   IR NEPHROSTOMY EXCHANGE LEFT 01/09/2017 DArne Cleveland MD [facility]INTER[facility]  . IR GENERIC HISTORICAL  01/09/2017   IR NEPHROSTOMY EXCHANGE RIGHT 01/09/2017 DArne Cleveland MD [facility]INTER[facility]  . IR GENERIC HISTORICAL  01/16/2016   IR NEPHROSTOMY EXCHANGE RIGHT 01/16/2016 [facility] OUT REF  . PACEMAKER INSERTION    . PREPATELLAR BURSA EXCISION    . ROTATOR CUFF REPAIR  1995   right shoulder  . TRANSURETHRAL RESECTION OF BLADDER TUMOR N/A 10/17/2015   Procedure: TRANSURETHRAL RESECTION OF BLADDER TUMOR (TURBT);  Surgeon: MRoyston Cowper  MD;  Location: [facility] ORS;  Service: Urology;  Laterality: N/A;  . TURP VAPORIZATION  2010    Social History   Social History  . Marital status: Married    Spouse name: N/A  . Number of children: N/A  . Years of education: N/A   Occupational History  . Not on file.   Social History Main Topics  . Smoking status: Former Smoker    Types: Cigarettes  . Smokeless tobacco: Former Systems developer     Comment: 1992  . Alcohol use No  . Drug use: No  . Sexual activity: Not on file   Other Topics Concern  . Not on file   Social History Narrative   Lives at home with wife, very independent. Active  at baseline.  Retired from [employer]. 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    Family History  Problem Relation Age of Onset  . Lymphoma Sister   . Bone cancer Mother   . CAD Father   . CVA Father   . Prostate cancer Neg Hx   . Bladder Cancer Neg Hx   . Kidney cancer Neg Hx      Current Outpatient Prescriptions:  .  acetaminophen (TYLENOL) 500 MG tablet, Take 500 mg by mouth every 6 (six) hours as needed., Disp: , Rfl:  .  azelastine (ASTELIN) 0.1 % nasal spray, Place 2 sprays into both nostrils 2 (two) times daily. Use in each nostril as directed, Disp: , Rfl:  .  cholecalciferol (VITAMIN D) 1000 units tablet, Take 1,000 Units by mouth daily., Disp: , Rfl:  .  cyanocobalamin (,VITAMIN B-12,) 1000 MCG/ML injection, Inject 1,000 mcg into the muscle every 30 (thirty) days., Disp: , Rfl:  .  enalapril (VASOTEC) 5 MG tablet, Take 5 mg by mouth daily. , Disp: , Rfl:  .  simvastatin (ZOCOR) 10 MG tablet, Take 10 mg by mouth at bedtime. , Disp: , Rfl:  .  Sodium Chloride Flush (NORMAL SALINE FLUSH) 0.9 % SOLN, use 10 milliliter SALINE FLUSHES AS DIRECTED, Disp: 1200 mL, Rfl: 5 .  sotalol (BETAPACE) 80 MG tablet, Take 80 mg by mouth 2 (two) times daily. , Disp: , Rfl:  .  clindamycin (CLEOCIN) 150 MG capsule, Take 150 mg by mouth 3 (three) times daily. , Disp: , Rfl:  .  furosemide (LASIX) 20 MG tablet, Take 20 mg by mouth daily as needed. , Disp: , Rfl:  .  ondansetron (ZOFRAN) 8 MG tablet, Take by mouth., Disp: , Rfl:   Physical exam:  Vitals:   02/04/17 1446  BP: 121/79  Pulse: 70  Resp: 18  Temp: (!) 96.5 F (35.8 C)  TempSrc: Tympanic  Weight: 154 lb 1.6 oz (69.9 kg)   Physical Exam  Constitutional: He is oriented to person, place, and time and well-developed, well-nourished, and in no distress.  HENT:  Head: Normocephalic and atraumatic.  Eyes: EOM are normal. Pupils are equal, round, and reactive to light.  Neck: Normal range of motion.  Cardiovascular: Normal rate,  regular rhythm and normal heart sounds.   Pulmonary/Chest: Effort normal and breath sounds normal.  Abdominal: Soft. Bowel sounds are normal.  Bilateral nephrostomy tubes in place draining clear urine  Musculoskeletal: He exhibits edema (Trails bilateral +1 edema).  Neurological: He is alert and oriented to person, place, and time.  Skin: Skin is warm and dry.     CMP Latest Ref Rng & Units 01/31/2017  Glucose 65 - 99 mg/dL 97  BUN 6 - 20  mg/dL 28(H)  Creatinine 0.61 - 1.24 mg/dL 1.75(H)  Sodium 135 - 145 mmol/L 136  Potassium 3.5 - 5.1 mmol/L 4.0  Chloride 101 - 111 mmol/L 102  CO2 22 - 32 mmol/L 25  Calcium 8.9 - 10.3 mg/dL 8.5(L)  Total Protein 6.5 - 8.1 g/dL -  Total Bilirubin 0.3 - 1.2 mg/dL -  Alkaline Phos 38 - 126 U/L -  AST 15 - 41 U/L -  ALT 17 - 63 U/L -   CBC Latest Ref Rng & Units 01/31/2017  WBC 3.8 - 10.6 K/uL 9.6  Hemoglobin 13.0 - 18.0 g/dL 8.6(L)  Hematocrit 40.0 - 52.0 % 26.0(L)  Platelets 150 - 440 K/uL 410    No images are attached to the encounter.  Dg Chest 2 View  Result Date: 01/31/2017 CLINICAL DATA:  Mid chest pain, shortness of breath, valve replacement EXAM: CHEST  2 VIEW COMPARISON:  12/25/2015 FINDINGS: There are numerous bilateral pulmonary nodules in the upper and lower lungs, right greater than left of varying sizes. There is no focal consolidation. There is no pleural effusion or pneumothorax. The heart and mediastinal contours are unremarkable. There is evidence of prior CABG. The osseous structures are unremarkable. IMPRESSION: Numerous bilateral pulmonary nodules in the upper lower lungs, right greater than left of varying sizes. These are new compared with 09/03/2016. Differential considerations include metastatic disease versus septic emboli versus fungal disease. Electronically Signed   By: Kathreen Devoid   On: 01/31/2017 11:47   Ct Angio Chest Pe W And/or Wo Contrast  Result Date: 01/31/2017 CLINICAL DATA:  Chest pain with deep inspiration.  History of bladder cancer. EXAM: CT ANGIOGRAPHY CHEST WITH CONTRAST TECHNIQUE: Multidetector CT imaging of the chest was performed using the standard protocol during bolus administration of intravenous contrast. Multiplanar CT image reconstructions and MIPs were obtained to evaluate the vascular anatomy. CONTRAST:  60 cc Isovue 370 IV COMPARISON:  09/03/2016 FINDINGS: Cardiovascular: No filling defects in the pulmonary arteries to suggest pulmonary emboli. Heart is mildly enlarged. Aorta is normal caliber. Scattered aortic and coronary artery calcifications. Prior CABG. Mediastinum/Nodes: Mild right hilar adenopathy with right hilar lymph node measuring up to 2 cm in short axis diameter on image 49. Small and borderline sized mediastinal lymph nodes. No axillary adenopathy. Lungs/Pleura: Innumerable to bilateral pulmonary nodules compatible with extensive metastases. These are new or enlarged since prior study. Index left upper lobe nodule on image 24 measures 2 cm in greatest dimension. Index posterior right lower lobe nodule/ mass on image 50 measures up to 3.5 cm. No pleural effusions. Upper Abdomen: Imaging into the upper abdomen shows no acute findings. Musculoskeletal: Left pacer in place.  No acute bony abnormality. Review of the MIP images confirms the above findings. IMPRESSION: Innumerable bilateral pulmonary nodules and masses compatible with extensive metastatic disease. No evidence of pulmonary embolus. Mild cardiomegaly. Right hilar adenopathy. Electronically Signed   By: Rolm Baptise M.D.   On: 01/31/2017 12:38   Ir Nephrostomy Exchange Left  Result Date: 01/09/2017 CLINICAL DATA:  Bladder carcinoma, radiation cystitis, needs long-term indwelling percutaneous nephrostomy catheters. Presents for routine exchange. Some leaking around the left side catheter is described. EXAM: BILATERAL PERCUTANEOUS NEPHROSTOMY CATHETER EXCHANGE UNDER FLUOROSCOPY COMPARISON:  11/05/2016 (9 weeks 2 days ago)  FLUOROSCOPY TIME:  70 sec TECHNIQUE: The procedure, risks (including but not limited to bleeding, infection, organ damage ), benefits, and alternatives were explained to the patient. Questions regarding the procedure were encouraged and answered. The patient understands and consents to  the procedure. Intravenous Fentanyl and Versed were administered as conscious sedation during continuous monitoring of the patient's level of consciousness and physiological / cardiorespiratory status by the radiology RN, with a total moderate sedation time of 12 minutes. The nephrostomy tubes and surrounding skin were prepped with Betadine, draped in usual sterile fashion. 1% lidocaine administered subcutaneous at both sites A small amount of contrast was injected through the right nephrostomy catheter to opacify the renal collecting system. The catheter was cut. An Amplatz wire would not advance through the into the catheter because of deposits within the lumen. Using and a stiff angled Glidewire common catheter was Exchanged for a new 10-French pigtail catheter, formed centrally within the collecting system under fluoroscopy. Contrast injection confirms appropriate positioning. In a similar fashion, the left nephrostomy catheter was injected. There were internal deposits occluding the catheter tubing such that the left renal collecting system was incompletely opacified. Catheter was nonetheless cut, and exchanged for a new 10-French pigtail catheter, formed centrally within the left renal collecting system. Injection confirms appropriate positioning and patency. Both catheters were secured externally with 0 Prolene sutures. The patient tolerated the procedure well. COMPLICATIONS: None. IMPRESSION: 1. Technically successful exchange of bilateral nephrostomy catheters under fluoroscopy. Recommend shortened exchange interval to prevent obstructive debris and deposits from forming to such a degree within both catheters, to minimize  risk of catheter occlusion. Electronically Signed   By: Lucrezia Europe M.D.   On: 01/09/2017 11:35   Ir Nephrostomy Exchange Right  Result Date: 01/09/2017 CLINICAL DATA:  Bladder carcinoma, radiation cystitis, needs long-term indwelling percutaneous nephrostomy catheters. Presents for routine exchange. Some leaking around the left side catheter is described. EXAM: BILATERAL PERCUTANEOUS NEPHROSTOMY CATHETER EXCHANGE UNDER FLUOROSCOPY COMPARISON:  11/05/2016 (9 weeks 2 days ago) FLUOROSCOPY TIME:  70 sec TECHNIQUE: The procedure, risks (including but not limited to bleeding, infection, organ damage ), benefits, and alternatives were explained to the patient. Questions regarding the procedure were encouraged and answered. The patient understands and consents to the procedure. Intravenous Fentanyl and Versed were administered as conscious sedation during continuous monitoring of the patient's level of consciousness and physiological / cardiorespiratory status by the radiology RN, with a total moderate sedation time of 12 minutes. The nephrostomy tubes and surrounding skin were prepped with Betadine, draped in usual sterile fashion. 1% lidocaine administered subcutaneous at both sites A small amount of contrast was injected through the right nephrostomy catheter to opacify the renal collecting system. The catheter was cut. An Amplatz wire would not advance through the into the catheter because of deposits within the lumen. Using and a stiff angled Glidewire common catheter was Exchanged for a new 10-French pigtail catheter, formed centrally within the collecting system under fluoroscopy. Contrast injection confirms appropriate positioning. In a similar fashion, the left nephrostomy catheter was injected. There were internal deposits occluding the catheter tubing such that the left renal collecting system was incompletely opacified. Catheter was nonetheless cut, and exchanged for a new 10-French pigtail catheter, formed  centrally within the left renal collecting system. Injection confirms appropriate positioning and patency. Both catheters were secured externally with 0 Prolene sutures. The patient tolerated the procedure well. COMPLICATIONS: None. IMPRESSION: 1. Technically successful exchange of bilateral nephrostomy catheters under fluoroscopy. Recommend shortened exchange interval to prevent obstructive debris and deposits from forming to such a degree within both catheters, to minimize risk of catheter occlusion. Electronically Signed   By: Lucrezia Europe M.D.   On: 01/09/2017 11:35     Assessment and plan-  Patient is a 83 y.o. male with a history of high-grade transitional cell bladder carcinoma in the past now presenting with bilateral lung lesions consistent with metastatic disease  I met with the patient and his family and discussed the results of the thorax in detail as well as reviewed the prior results of CT abdomen from September 2017. He did have some evidence of early nodal metastases back then and now has evidence of bilateral lung metastases. Although patient is 30, he had a good quality of life and performance status up until a couple of weeks ago. Patient has reservations about chemotherapy and he does have significant anemia at baseline as well as chronic kidney disease. Hence I would like to try him immunotherapy for first-line treatment. I discussed the risks and benefits of Keytruda including all but not limited to autoimmune colitis, pneumonitis and the need to monitor kidney and liver functions. Patient understands and agrees to proceed. I explained to them that the goal of immunotherapy would be palliative and not curative to shrink the size of the tumor and improve his quality and longevity of life. Certainly treatment can be stopped at any time if patient is not tolerating it well. Patient understands and agrees to proceed. We will obtain CT abdomen and pelvis without contrast to assess the extent of  his disease as well as get a port placement prior to starting immunotherapy.  With regards to his normocytic anemia- I will obtain a comprehensive anemia workup including CBC, CMP, iron studies B12 and folate as well as reticulocyte count and haptoglobin. His anemia is likely secondary to chronic kidney disease and underlying malignancy. Certainly there is no reversible cause of anemia found we could consider giving him EPO shots for anemia of chronic kidney disease. I explained the risks and benefits of EPO including all but not limited to possible worsening of underlying malignancy which is not typically a concern in the palliative setting. Patient understands and agrees to proceed.  I will see him back in 10 days' time and plan to start treatment at that time   Total face to face encounter time for this patient visit was 30 min. >50% of the time was  spent in counseling and coordination of care.     Visit Diagnosis 1. Malignant neoplasm of posterior wall of urinary bladder (HCC)   2. Malignant neoplasm metastatic to lung, unspecified laterality ([place])   3. Goals of care, counseling/discussion      Dr. Randa Evens, MD, MPH [facility] at [facility] Pager- [phone] 02/04/2017 3:59 PM

## 2017-02-05 ENCOUNTER — Other Ambulatory Visit: Payer: Self-pay | Admitting: Oncology

## 2017-02-05 DIAGNOSIS — C679 Malignant neoplasm of bladder, unspecified: Secondary | ICD-10-CM

## 2017-02-05 DIAGNOSIS — C7800 Secondary malignant neoplasm of unspecified lung: Principal | ICD-10-CM

## 2017-02-05 LAB — HAPTOGLOBIN: Haptoglobin: 475 mg/dL — ABNORMAL HIGH (ref 34–200)

## 2017-02-05 NOTE — Telephone Encounter (Signed)
For some reason Dr. Janese Banks ordered the same CT scan you did but she asked the patient to picked up a prep kit. The patient is schd to have this done on 02-12-17.  I don't know why this was ordered but it was. Are you ok with this?  Sharyn Lull

## 2017-02-05 NOTE — Telephone Encounter (Signed)
Sure.  She's from the cancer center.   Hollice Espy, MD

## 2017-02-06 ENCOUNTER — Other Ambulatory Visit: Payer: Medicare Other

## 2017-02-06 ENCOUNTER — Telehealth: Payer: Self-pay | Admitting: *Deleted

## 2017-02-06 LAB — MULTIPLE MYELOMA PANEL, SERUM
ALBUMIN/GLOB SERPL: 0.7 (ref 0.7–1.7)
ALPHA 1: 0.5 g/dL — AB (ref 0.0–0.4)
ALPHA2 GLOB SERPL ELPH-MCNC: 1.2 g/dL — AB (ref 0.4–1.0)
Albumin SerPl Elph-Mcnc: 2.8 g/dL — ABNORMAL LOW (ref 2.9–4.4)
B-Globulin SerPl Elph-Mcnc: 1 g/dL (ref 0.7–1.3)
GAMMA GLOB SERPL ELPH-MCNC: 1.3 g/dL (ref 0.4–1.8)
GLOBULIN, TOTAL: 4.1 — AB (ref 2.2–3.9)
IGA: 123 mg/dL (ref 61–437)
IgG (Immunoglobin G), Serum: 1333 mg/dL (ref 700–1600)
IgM, Serum: 34 mg/dL (ref 15–143)
M Protein SerPl Elph-Mcnc: 0.6 g/dL — ABNORMAL HIGH
Total Protein ELP: 6.9 g/dL (ref 6.0–8.5)

## 2017-02-06 NOTE — Telephone Encounter (Signed)
Called and spoke to pt and she just got called about the port placement and the person she spoke to was going to try to get the CT scan and the port placement closer together. The wife states that pt can't stay that long at hospital. She has the date and time.  I then called and spoke to [facility] and she is the one working on moving his ct closer to the port time.  I did check with her and she will call pt back when they get the scan worked out to be closer together for pt.

## 2017-02-11 ENCOUNTER — Other Ambulatory Visit: Payer: Self-pay

## 2017-02-11 ENCOUNTER — Telehealth: Payer: Self-pay | Admitting: *Deleted

## 2017-02-11 ENCOUNTER — Encounter: Payer: Self-pay | Admitting: Cardiovascular Disease

## 2017-02-11 ENCOUNTER — Ambulatory Visit (INDEPENDENT_AMBULATORY_CARE_PROVIDER_SITE_OTHER): Payer: Medicare Other | Admitting: Cardiovascular Disease

## 2017-02-11 ENCOUNTER — Ambulatory Visit (INDEPENDENT_AMBULATORY_CARE_PROVIDER_SITE_OTHER): Payer: Medicare Other

## 2017-02-11 ENCOUNTER — Other Ambulatory Visit: Payer: Self-pay | Admitting: Radiology

## 2017-02-11 VITALS — BP 118/68 | HR 71 | Ht 64.0 in | Wt 150.5 lb

## 2017-02-11 DIAGNOSIS — I2510 Atherosclerotic heart disease of native coronary artery without angina pectoris: Secondary | ICD-10-CM

## 2017-02-11 DIAGNOSIS — I480 Paroxysmal atrial fibrillation: Secondary | ICD-10-CM | POA: Diagnosis not present

## 2017-02-11 DIAGNOSIS — I340 Nonrheumatic mitral (valve) insufficiency: Secondary | ICD-10-CM

## 2017-02-11 MED ORDER — SOTALOL HCL 80 MG PO TABS: 80.0000 mg | ORAL_TABLET | Freq: Every day | ORAL | 3 refills | Status: DC

## 2017-02-11 NOTE — Progress Notes (Signed)
Cardiology Office Note   Date:  02/11/2017   ID:  Austin Contreras, DOB March 13, 1926, MRN [id number]  PCP:  [name], MD  Cardiologist:   Kathlyn Sacramento, MD   Chief Complaint  Patient presents with  . other    New Patient. Pt c/o sob. Reviewed meds with pt verbally.      History of Present Illness: Austin Contreras is a 83 y.o. male who presents for A follow-up visit after recent emergency room visit. He was previously followed by Dr.Khan. He has known history of coronary artery disease and aortic stenosis status post CABG 2 and bioprosthetic aortic valve replacement in 2013, bladder cancer status post TURP, nephrostomy tube is, paroxysmal atrial fibrillation, prior GI bleed (thus not on anticoagulation), essential hypertension, hyperlipidemia, bradycardia status post [employer] permanent pacemaker placement and chronic kidney disease. He recently presented to [facility] emergency room with fatigue and shortness of breath. The patient has been on sotalol for atrial fibrillation. He experienced progressive fatigue, reduced exercise tolerance, weight loss and pleuritic chest pain over the last month. According to the notes, he saw Dr.Khan recently for routine device interrogation and was told that she was having A. fib with RVR. Thus, the dose of sotalol was increased to 80 mg twice daily. In the ED, the patient was noted to be hemodynamically stable. He had CT scan of the lungs which showed multiple pulmonary nodules suggestive of metastatic cancer. This was felt to be the culprit for his symptoms. He was seen by oncology and the plan is to start immunotherapy on Friday.     Past Medical History:  Diagnosis Date  . B12 deficiency anemia 08/02/2015  . Bladder cancer ([place])    a. stage 2 s/p TUBTR;  b. s/p bilateral nephrostomy tubes.  Marland Kitchen BPH (benign prostatic hyperplasia)   . CKD (chronic kidney disease) stage 3, GFR 30-59 ml/min   . Coronary artery disease    a. 08/2012 s/p CABG x 2  (VG->RCA, VG->OM1) @ [facility];  b. H/o stress test some time after CABG (pt unsure of date) -> reportedly normal.  . GERD (gastroesophageal reflux disease)   . H/O aortic valve replacement with porcine valve    a. 08/2012 52mm Trifecta Biologic Valve.  Marland Kitchen History of GI bleed    a. ~ 2011 - required 6 units PRBC's.  . History of nephrolithiasis   . Hyperlipidemia   . Hypertension   . Mitral regurgitation   . PAF (paroxysmal atrial fibrillation) ([place])    a. Developed post-op AVR/CABG in 08/2012.  Initially treated with amio but did not tolerate.  On sotalol since late 2013.  No OAC despite CHA2DS2VASc = 4 secondary to h/o GIB and chronic hematuria in the setting of bladder cancer s/p nephrostomy tubes.  . Peripheral neuropathy ([place])   . PONV (postoperative nausea and vomiting)   . Presence of permanent cardiac pacemaker    a. 08/2012 developed heart block/junctional bradycardia following CABG/AVR-->s/p MDT CRM Advisa DR MRI SureScan A2DR01 Dual Chamber PPM, ser # [id number] H.  . SVT (supraventricular tachycardia) ([place])     Past Surgical History:  Procedure Laterality Date  . AORTIC VALVE REPLACEMENT    . APPENDECTOMY  1942  . BACK SURGERY  1991  . CATARACT EXTRACTION, BILATERAL    . cataracts    . CORONARY ARTERY BYPASS GRAFT  08/2012  . CYSTOSCOPY WITH FULGERATION Bilateral 12/23/2015   Procedure: CYSTOSCOPY WITH FULGERATION;  Surgeon: Royston Cowper, MD;  Location: [facility] ORS;  Service:  Urology;  Laterality: Bilateral;  . HAND SURGERY  1976   after trauma  . IR GENERIC HISTORICAL  09/06/2016   IR NEPHROSTOMY EXCHANGE LEFT 09/06/2016 Greggory Keen, MD [facility]-INTERV RAD  . IR GENERIC HISTORICAL  09/06/2016   IR NEPHROSTOMY EXCHANGE RIGHT 09/06/2016 Greggory Keen, MD [facility]INTERV RAD  . IR GENERIC HISTORICAL  11/05/2016   IR NEPHROSTOMY TUBE CHANGE 11/05/2016 Aletta Edouard, MD [facility]INTERV RAD  . IR GENERIC HISTORICAL  01/09/2017   IR NEPHROSTOMY EXCHANGE LEFT 01/09/2017 Arne Cleveland, MD [facility]-INTERV  RAD  . IR GENERIC HISTORICAL  01/09/2017   IR NEPHROSTOMY EXCHANGE RIGHT 01/09/2017 Arne Cleveland, MD [facility]INTERV RAD  . IR GENERIC HISTORICAL  01/16/2016   IR NEPHROSTOMY EXCHANGE RIGHT 01/16/2016 [facility] OUT REF  . PACEMAKER INSERTION    . PREPATELLAR BURSA EXCISION    . ROTATOR CUFF REPAIR  1995   right shoulder  . TRANSURETHRAL RESECTION OF BLADDER TUMOR N/A 10/17/2015   Procedure: TRANSURETHRAL RESECTION OF BLADDER TUMOR (TURBT);  Surgeon: Royston Cowper, MD;  Location: [facility] ORS;  Service: Urology;  Laterality: N/A;  . TURP VAPORIZATION  2010     Current Outpatient Prescriptions  Medication Sig Dispense Refill  . acetaminophen (TYLENOL) 500 MG tablet Take 500 mg by mouth every 6 (six) hours as needed.    Marland Kitchen azelastine (ASTELIN) 0.1 % nasal spray Place 2 sprays into both nostrils 2 (two) times daily. Use in each nostril as directed    . cholecalciferol (VITAMIN D) 1000 units tablet Take 1,000 Units by mouth daily.    . clindamycin (CLEOCIN) 150 MG capsule Take 150 mg by mouth 3 (three) times daily as needed.     . cyanocobalamin (,VITAMIN B-12,) 1000 MCG/ML injection Inject 1,000 mcg into the muscle every 30 (thirty) days.    . enalapril (VASOTEC) 5 MG tablet Take 5 mg by mouth daily.     . furosemide (LASIX) 20 MG tablet Take 20 mg by mouth daily as needed.     . ondansetron (ZOFRAN) 8 MG tablet Take by mouth.    . simvastatin (ZOCOR) 10 MG tablet Take 10 mg by mouth at bedtime.     . Sodium Chloride Flush (NORMAL SALINE FLUSH) 0.9 % SOLN use 10 milliliter SALINE FLUSHES AS DIRECTED 1200 mL 5  . sotalol (BETAPACE) 80 MG tablet Take 1 tablet (80 mg total) by mouth daily. 30 tablet 3   No current facility-administered medications for this visit.     Allergies:   Penicillins and Flu virus vaccine    Social History:  The patient  reports that he has quit smoking. His smoking use included Cigarettes. He has quit using smokeless tobacco. He reports that he does not drink alcohol or use  drugs.   Family History:  The patient's family history includes Bone cancer in his mother; CAD in his father; CVA in his father; Lymphoma in his sister.    ROS:  Please see the history of present illness.   Otherwise, review of systems are positive for none.   All other systems are reviewed and negative.    PHYSICAL EXAM: VS:  BP 118/68 (BP Location: Right Arm, Patient Position: Sitting, Cuff Size: Normal)   Pulse 71   Ht 5\' 4"  (1.626 m)   Wt 150 lb 8 oz (68.3 kg)   BMI 25.83 kg/m  , BMI Body mass index is 25.83 kg/m. GEN: Well nourished, well developed, in no acute distress  HEENT: normal  Neck: no JVD, carotid bruits, or masses Cardiac:  RRR; no rubs, or gallops,no edema . 3/6 holosystolic murmur at the apex  Respiratory:  clear to auscultation bilaterally, normal work of breathing GI: soft, nontender, nondistended, + BS MS: no deformity or atrophy  Skin: warm and dry, no rash Neuro:  Strength and sensation are intact Psych: euthymic mood, full affect   EKG:  EKG is ordered today. The ekg ordered today demonstrates atrial paced rhythm with nonspecific IVCD  Recent Labs: 02/04/2017: ALT 18; BUN 20; Creatinine, Ser 1.58; Hemoglobin 8.9; Platelets 471; Potassium 4.6; Sodium 137    Lipid Panel    Component Value Date/Time   CHOL 136 12/19/2013 0534   TRIG 107 12/19/2013 0534   HDL 38 (L) 12/19/2013 0534   VLDL 21 12/19/2013 0534   LDLCALC 77 12/19/2013 0534      Wt Readings from Last 3 Encounters:  02/11/17 150 lb 8 oz (68.3 kg)  02/04/17 154 lb 1.6 oz (69.9 kg)  01/31/17 162 lb (73.5 kg)        PAD Screen 02/11/2017  Previous PAD dx? No  Previous surgical procedure? No  Pain with walking? No  Feet/toe relief with dangling? No  Painful, non-healing ulcers? No  Extremities discolored? No      ASSESSMENT AND PLAN:  1.  Failure to thrive and weight loss: I suspect that this is likely due to underlying malignancy. He is going to start immunotherapy this  Friday.  2. Coronary artery disease involving native coronary arteries without angina: The patient has no anginal symptoms. Continue medical therapy.  3. Paroxysmal atrial fibrillation: He is in atrial paced rhythm today. Given underlying chronic kidney disease, I decreased sotalol back to 80 mg once daily. The patient has prior history of GI bleed and he is currently anemic and thus not a good candidate to start anticoagulation at the present time especially that he is going to start immunotherapy soon for metastatic bladder cancer.  4. Status post permanent pacemaker placement: I'm referring him to Dr. Caryl Comes for device interrogation and see if he is having breakthrough atrial fibrillation.  5. Mitral regurgitation: He has at least moderate mitral regurgitation which could be causing some of his symptoms. The jet was very eccentric and TEE is a better diagnostic test to further evaluate this but I think we should wait until his cancer is treated.   Disposition:   FU with me in 1 month  Signed,  Kathlyn Sacramento, MD  02/11/2017 5:25 PM    [facility]

## 2017-02-11 NOTE — Telephone Encounter (Signed)
Spoke with Maudie Mercury and let her know that Dr Janese Banks said we would accommodate him regarding his B12 injection. She thanked me

## 2017-02-11 NOTE — Patient Instructions (Signed)
Medication Instructions:  Your physician has recommended you make the following change in your medication:  DECREASE sotalol to 80mg  once daily   Labwork: none  Testing/Procedures: none  Follow-Up: Your physician recommends that you schedule a follow-up appointment in: one month with Dr. Fletcher Anon.    Any Other Special Instructions Will Be Listed Below (If Applicable). Your physician recommends that you schedule an appt with Dr. Caryl Comes for pacemaker check.       If you need a refill on your cardiac medications before your next appointment, please call your pharmacy.

## 2017-02-11 NOTE — Telephone Encounter (Signed)
Has an appt Friday for treatment and is asking if we can give him his B12 shot scheduled to be given by Dr Ouida Sills this week so that he does not have to go to 2 places. Please advise

## 2017-02-12 ENCOUNTER — Encounter: Payer: Self-pay | Admitting: Interventional Radiology

## 2017-02-12 ENCOUNTER — Ambulatory Visit: Admission: RE | Admit: 2017-02-12 | Payer: Medicare Other | Source: Ambulatory Visit

## 2017-02-12 ENCOUNTER — Other Ambulatory Visit: Payer: Self-pay | Admitting: *Deleted

## 2017-02-12 ENCOUNTER — Ambulatory Visit: Payer: Medicare Other

## 2017-02-12 ENCOUNTER — Ambulatory Visit
Admission: RE | Admit: 2017-02-12 | Discharge: 2017-02-12 | Disposition: A | Discharge status: Home / Self Care | Payer: Medicare Other | Source: Ambulatory Visit | Attending: Oncology | Admitting: Oncology

## 2017-02-12 DIAGNOSIS — C674 Malignant neoplasm of posterior wall of bladder: Secondary | ICD-10-CM | POA: Diagnosis present

## 2017-02-12 DIAGNOSIS — D519 Vitamin B12 deficiency anemia, unspecified: Secondary | ICD-10-CM | POA: Diagnosis not present

## 2017-02-12 DIAGNOSIS — Z950 Presence of cardiac pacemaker: Secondary | ICD-10-CM | POA: Insufficient documentation

## 2017-02-12 DIAGNOSIS — Z936 Other artificial openings of urinary tract status: Secondary | ICD-10-CM | POA: Diagnosis not present

## 2017-02-12 DIAGNOSIS — I340 Nonrheumatic mitral (valve) insufficiency: Secondary | ICD-10-CM | POA: Diagnosis not present

## 2017-02-12 DIAGNOSIS — N400 Enlarged prostate without lower urinary tract symptoms: Secondary | ICD-10-CM | POA: Diagnosis not present

## 2017-02-12 DIAGNOSIS — Z87891 Personal history of nicotine dependence: Secondary | ICD-10-CM | POA: Insufficient documentation

## 2017-02-12 DIAGNOSIS — K8020 Calculus of gallbladder without cholecystitis without obstruction: Secondary | ICD-10-CM | POA: Diagnosis not present

## 2017-02-12 DIAGNOSIS — Z953 Presence of xenogenic heart valve: Secondary | ICD-10-CM | POA: Diagnosis not present

## 2017-02-12 DIAGNOSIS — N202 Calculus of kidney with calculus of ureter: Secondary | ICD-10-CM | POA: Insufficient documentation

## 2017-02-12 DIAGNOSIS — Z87442 Personal history of urinary calculi: Secondary | ICD-10-CM | POA: Insufficient documentation

## 2017-02-12 DIAGNOSIS — R918 Other nonspecific abnormal finding of lung field: Secondary | ICD-10-CM | POA: Insufficient documentation

## 2017-02-12 DIAGNOSIS — I129 Hypertensive chronic kidney disease with stage 1 through stage 4 chronic kidney disease, or unspecified chronic kidney disease: Secondary | ICD-10-CM | POA: Insufficient documentation

## 2017-02-12 DIAGNOSIS — Z8249 Family history of ischemic heart disease and other diseases of the circulatory system: Secondary | ICD-10-CM | POA: Insufficient documentation

## 2017-02-12 DIAGNOSIS — I471 Supraventricular tachycardia: Secondary | ICD-10-CM | POA: Diagnosis not present

## 2017-02-12 DIAGNOSIS — N183 Chronic kidney disease, stage 3 (moderate): Secondary | ICD-10-CM | POA: Insufficient documentation

## 2017-02-12 DIAGNOSIS — Z9889 Other specified postprocedural states: Secondary | ICD-10-CM | POA: Insufficient documentation

## 2017-02-12 DIAGNOSIS — C7800 Secondary malignant neoplasm of unspecified lung: Secondary | ICD-10-CM | POA: Insufficient documentation

## 2017-02-12 DIAGNOSIS — Z951 Presence of aortocoronary bypass graft: Secondary | ICD-10-CM | POA: Diagnosis not present

## 2017-02-12 DIAGNOSIS — I480 Paroxysmal atrial fibrillation: Secondary | ICD-10-CM | POA: Insufficient documentation

## 2017-02-12 DIAGNOSIS — Z452 Encounter for adjustment and management of vascular access device: Secondary | ICD-10-CM | POA: Insufficient documentation

## 2017-02-12 DIAGNOSIS — G629 Polyneuropathy, unspecified: Secondary | ICD-10-CM | POA: Diagnosis not present

## 2017-02-12 DIAGNOSIS — C679 Malignant neoplasm of bladder, unspecified: Secondary | ICD-10-CM

## 2017-02-12 DIAGNOSIS — Z823 Family history of stroke: Secondary | ICD-10-CM | POA: Insufficient documentation

## 2017-02-12 DIAGNOSIS — Z79899 Other long term (current) drug therapy: Secondary | ICD-10-CM | POA: Insufficient documentation

## 2017-02-12 DIAGNOSIS — K219 Gastro-esophageal reflux disease without esophagitis: Secondary | ICD-10-CM | POA: Diagnosis not present

## 2017-02-12 DIAGNOSIS — Z880 Allergy status to penicillin: Secondary | ICD-10-CM | POA: Insufficient documentation

## 2017-02-12 DIAGNOSIS — E785 Hyperlipidemia, unspecified: Secondary | ICD-10-CM | POA: Diagnosis not present

## 2017-02-12 DIAGNOSIS — Z8042 Family history of malignant neoplasm of prostate: Secondary | ICD-10-CM | POA: Insufficient documentation

## 2017-02-12 DIAGNOSIS — Z8052 Family history of malignant neoplasm of bladder: Secondary | ICD-10-CM | POA: Insufficient documentation

## 2017-02-12 DIAGNOSIS — R001 Bradycardia, unspecified: Secondary | ICD-10-CM | POA: Diagnosis not present

## 2017-02-12 HISTORY — PX: IR GENERIC HISTORICAL: IMG1180011

## 2017-02-12 LAB — ECHOCARDIOGRAM COMPLETE
AO mean calculated velocity dopler: 96.2 cm/s
AOASC: 30 cm
AV Area VTI index: 1.17 cm2/m2
AV Area mean vel: 2.29 cm2
AV VEL mean LVOT/AV: 0.9
AV area mean vel ind: 1.22 cm2/m2
AVG: 6 mmHg
Area-P 1/2: 2.34 cm2
CHL CUP AV VALUE AREA INDEX: 1.17
CHL CUP AV VEL: 2.19
CHL CUP DOP CALC LVOT VTI: 26.1 cm
CHL CUP LVOT MV VTI INDEX: 1.32 cm2/m2
CHL CUP PV REG GRAD DIAS: 12 mmHg
CHL CUP REG VEL DIAS: 171 cm/s
CHL CUP STROKE VOLUME: 63 mL
E/e' ratio: 10.9
EWDT: 218 ms
FS: 30 % (ref 28–44)
IVS/LV PW RATIO, ED: 1.08
LA ID, A-P, ES: 44 mm
LA diam end sys: 44 mm
LA diam index: 2.35 cm/m2
LA vol index: 50.6 mL/m2
LAVOL: 94.6 mL
LAVOLA4C: 93.6 mL
LDCA: 2.54 cm2
LV E/e' medial: 10.9
LV E/e'average: 10.9
LV PW d: 12 mm — AB (ref 0.6–1.1)
LV TDI E'MEDIAL: 5.22
LV dias vol index: 63 mL/m2
LV sys vol: 54 mL (ref 21–61)
LVDIAVOL: 117 mL (ref 62–150)
LVELAT: 9.36 cm/s
LVOT MV VTI: 2.46
LVOT diameter: 18 mm
LVOTSV: 66 mL
LVOTVTI: 0.86 cm
LVSYSVOLIN: 29 mL/m2
Lateral S' vel: 11.4 cm/s
MV Dec: 218
MV M vel: 51
MV pk E vel: 102 m/s
MVANNULUSVTI: 26.9 cm
MVPG: 4 mmHg
MVSPHT: 64 ms
Mean grad: 1 mmHg
RV TAPSE: 15.4 mm
Simpson's disk: 54
TDI e' lateral: 9.36
VTI: 30.3 cm
Valve area: 2.19 cm2

## 2017-02-12 LAB — CBC
HCT: 25.6 % — ABNORMAL LOW (ref 40.0–52.0)
Hemoglobin: 8.6 g/dL — ABNORMAL LOW (ref 13.0–18.0)
MCH: 26.9 pg (ref 26.0–34.0)
MCHC: 33.5 g/dL (ref 32.0–36.0)
MCV: 80.5 fL (ref 80.0–100.0)
PLATELETS: 438 10*3/uL (ref 150–440)
RBC: 3.19 MIL/uL — ABNORMAL LOW (ref 4.40–5.90)
RDW: 18 % — AB (ref 11.5–14.5)
WBC: 11.6 10*3/uL — ABNORMAL HIGH (ref 3.8–10.6)

## 2017-02-12 LAB — PROTIME-INR
INR: 1.19
Prothrombin Time: 15.2 seconds (ref 11.4–15.2)

## 2017-02-12 LAB — BASIC METABOLIC PANEL
Anion gap: 9 (ref 5–15)
BUN: 28 mg/dL — AB (ref 6–20)
CHLORIDE: 101 mmol/L (ref 101–111)
CO2: 25 mmol/L (ref 22–32)
CREATININE: 1.62 mg/dL — AB (ref 0.61–1.24)
Calcium: 8.8 mg/dL — ABNORMAL LOW (ref 8.9–10.3)
GFR calc Af Amer: 42 mL/min — ABNORMAL LOW (ref >60)
GFR calc non Af Amer: 36 mL/min — ABNORMAL LOW (ref >60)
Glucose, Bld: 102 mg/dL — ABNORMAL HIGH (ref 65–99)
Potassium: 4.5 mmol/L (ref 3.5–5.1)
SODIUM: 135 mmol/L (ref 135–145)

## 2017-02-12 LAB — APTT: aPTT: 39 seconds — ABNORMAL HIGH (ref 24–36)

## 2017-02-12 MED ORDER — VANCOMYCIN HCL IN DEXTROSE 1-5 GM/200ML-% IV SOLN
1000.0000 mg | INTRAVENOUS | Status: AC
  Administered 2017-02-12: 1000 mg via INTRAVENOUS
  Filled 2017-02-12 (×2): qty 200

## 2017-02-12 MED ORDER — FENTANYL CITRATE (PF) 100 MCG/2ML IJ SOLN
INTRAMUSCULAR | Status: AC | PRN
  Administered 2017-02-12: 50 ug via INTRAVENOUS
  Administered 2017-02-12: 25 ug via INTRAVENOUS

## 2017-02-12 MED ORDER — SODIUM CHLORIDE 0.9 % IV SOLN
INTRAVENOUS | Status: DC
  Administered 2017-02-12: 11:00:00 via INTRAVENOUS

## 2017-02-12 MED ORDER — VANCOMYCIN HCL IN DEXTROSE 1-5 GM/200ML-% IV SOLN
1000.0000 mg | INTRAVENOUS | Status: DC
  Filled 2017-02-12 (×2): qty 200

## 2017-02-12 MED ORDER — SODIUM CHLORIDE 0.9 % IV SOLN: INTRAVENOUS | Status: DC

## 2017-02-12 MED ORDER — MIDAZOLAM HCL 5 MG/5ML IJ SOLN
INTRAMUSCULAR | Status: AC | PRN
  Administered 2017-02-12 (×2): 1 mg via INTRAVENOUS

## 2017-02-12 MED ORDER — LIDOCAINE HCL (PF) 1 % IJ SOLN
INTRAMUSCULAR | Status: DC | PRN
  Administered 2017-02-12: 15 mL

## 2017-02-12 NOTE — Procedures (Signed)
Interventional Radiology Procedure Note  Procedure: Placement of a right IJ approach single lumen PowerPort.  Tip is positioned at the superior cavoatrial junction and catheter is ready for immediate use.  Complications: No immediate Recommendations:  - Ok to shower tomorrow - Do not submerge for 7 days - Routine line care   Signed,  Heath K. McCullough, MD   

## 2017-02-12 NOTE — Discharge Instructions (Signed)
Tunneled Catheter Insertion, Care After  Refer to this sheet in the next few weeks. These instructions provide you with information about caring for yourself after your procedure. Your health care provider may also give you more specific instructions. Your treatment has been planned according to current medical practices, but problems sometimes occur. Call your health care provider if you have any problems or questions after your procedure.  What can I expect after the procedure?  After the procedure, it is common to have:  · Some mild redness, swelling, and pain around your catheter site.  · A small amount of blood or clear fluid coming from your incisions.    Follow these instructions at home:  Incision care   · Check your incision areas every day for signs of infection. Check for:  ? More redness, swelling, or pain.  ? More fluid or blood.  ? Warmth.  ? Pus or a bad smell.  · Follow instructions from your health care provider about how to take care of your incisions. Make sure you:  ? Wash your hands with soap and water before you change your bandages (dressings). If soap and water are not available, use hand sanitizer.  ? Change your dressings as told by your health care provider. Wash the area around your incisions with a germ-killing (antiseptic) solution when you change your dressing, as told by your health care provider.  ? Leave stitches (sutures), skin glue, or adhesive strips in place. These skin closures may need to stay in place for 2 weeks or longer. If adhesive strip edges start to loosen and curl up, you may trim the loose edges. Do not remove adhesive strips completely unless your health care provider tells you to do that.  Catheter Care     · Wash your hands with soap and water before and after caring for your catheter. If soap and water are not available, use hand sanitizer.  · Keep your catheter site and your dressings clean and dry.  · Apply an antibiotic ointment to your catheter site as told  by your health care provider.  · Flush your catheter as told by your health care provider. This helps prevent it from becoming clogged.  · Do not open the caps on the ends of the catheter.  · Do not pull on your catheter.  · If your catheter is in your arm:  ? Avoid wearing tight clothes or tight jewelry on your arm that has the catheter.  ? Do not sleep with your head on the arm that has the catheter.  ? Do not allow your blood pressure to be taken on the arm that has the catheter.  ? Do not allow your blood to be drawn from the arm that has the catheter, except through the catheter itself.  Medicines   · Take over-the-counter and prescription medicines only as told by your health care provider.  · If you were prescribed an antibiotic medicine, take it as told by your health care provider. Do not stop taking the antibiotic even if you start to feel better.  Activity   · Return to your normal activities as told by your health care provider. Ask your health care provider what activities are safe for you.  · Do not lift anything that is heavier than 10 lb (4.5 kg) for 3 weeks or as long as told by your health care provider.  Driving   · Do not drive until your health care provider approves.  ·   Do not drive or operate heavy machinery while taking prescription pain medicine.  General instructions   · Follow your health care provider's specific instructions for the type of catheter that you have.  · Do not take baths, swim, or use a hot tub until your health care provider approves.  · Follow instructions from your health care provider about eating or drinking restrictions.  · Wear compression stockings as told by your health care provider. These stockings help to prevent blood clots and reduce swelling in your legs.  · Keep all follow-up visits as told by your health care provider. This is important.  Contact a health care provider if:  · You have more fluid or blood coming from your incisions.  · You have more redness,  swelling, or pain at your incisions or around the area where your catheter is inserted.  · Your incisions feel warm to the touch.  · You feel unusually weak.  · You feel nauseous.  · Your catheter is not working properly.  · You have blood or fluid draining from your catheter.  · You are unable to flush your catheter.  Get help right away if:  · Your catheter breaks.  · A hole develops in your catheter.  · Your catheter comes loose or gets pulled completely out. If this happens, press on your catheter site firmly with your hand or a clean cloth until you get medical help.  · Your catheter becomes blocked.  · You have swelling in your arm, shoulder, neck, or face.  · You develop chest pain.  · You have difficulty breathing.  · You feel dizzy or light-headed.  · You have pus or a bad smell coming from your incisions.  · You have a fever.  · You develop bleeding from your catheter or your insertion site, and your bleeding does not stop.  This information is not intended to replace advice given to you by your health care provider. Make sure you discuss any questions you have with your health care provider.  Document Released: 11/18/2012 Document Revised: 08/04/2016 Document Reviewed: 08/28/2015  [employer] Interactive Patient Education © 2017 [employer]

## 2017-02-12 NOTE — H&P (Signed)
Chief Complaint: Patient was seen in consultation today for poor venous access, metastatic bladder cancer at the request of Rao,Archana C  Referring Physician(s): Rao,Archana C  Patient Status: [facility] - Out-pt  History of Present Illness: Austin Contreras is a 83 y.o. male with a history of high grade transitional cell bladder cancer.  He also has new pulmonary nodules and an external iliac node suspicious for metastatic disease.   He will be undergoing immunotherapy with Nat Math under the care of Dr. Janese Banks.     Past Medical History:  Diagnosis Date  . B12 deficiency anemia 08/02/2015  . Bladder cancer ([place])    a. stage 2 s/p TUBTR;  b. s/p bilateral nephrostomy tubes.  Marland Kitchen BPH (benign prostatic hyperplasia)   . CKD (chronic kidney disease) stage 3, GFR 30-59 ml/min   . Coronary artery disease    a. 08/2012 s/p CABG x 2 (VG->RCA, VG->OM1) @ [facility];  b. H/o stress test some time after CABG (pt unsure of date) -> reportedly normal.  . GERD (gastroesophageal reflux disease)   . H/O aortic valve replacement with porcine valve    a. 08/2012 47mm Trifecta Biologic Valve.  Marland Kitchen History of GI bleed    a. ~ 2011 - required 6 units PRBC's.  . History of nephrolithiasis   . Hyperlipidemia   . Hypertension   . Mitral regurgitation   . PAF (paroxysmal atrial fibrillation) ([place])    a. Developed post-op AVR/CABG in 08/2012.  Initially treated with amio but did not tolerate.  On sotalol since late 2013.  No OAC despite CHA2DS2VASc = 4 secondary to h/o GIB and chronic hematuria in the setting of bladder cancer s/p nephrostomy tubes.  . Peripheral neuropathy ([place])   . PONV (postoperative nausea and vomiting)   . Presence of permanent cardiac pacemaker    a. 08/2012 developed heart block/junctional bradycardia following CABG/AVR-->s/p MDT CRM Advisa DR MRI SureScan [id number] Dual Chamber PPM, ser # [id number] H.  . SVT (supraventricular tachycardia) ([place])     Past Surgical History:  Procedure Laterality Date  .  AORTIC VALVE REPLACEMENT    . APPENDECTOMY  1942  . BACK SURGERY  1991  . CATARACT EXTRACTION, BILATERAL    . cataracts    . CORONARY ARTERY BYPASS GRAFT  08/2012  . CYSTOSCOPY WITH FULGERATION Bilateral 12/23/2015   Procedure: CYSTOSCOPY WITH FULGERATION;  Surgeon: Royston Cowper, MD;  Location: [facility] ORS;  Service: Urology;  Laterality: Bilateral;  . HAND SURGERY  1976   after trauma  . IR GENERIC HISTORICAL  09/06/2016   IR NEPHROSTOMY EXCHANGE LEFT 09/06/2016 Greggory Keen, MD [facility]  . IR GENERIC HISTORICAL  09/06/2016   IR NEPHROSTOMY EXCHANGE RIGHT 09/06/2016 Greggory Keen, MD [facility]  . IR GENERIC HISTORICAL  11/05/2016   IR NEPHROSTOMY TUBE CHANGE 11/05/2016 Aletta Edouard, MD [facility]  . IR GENERIC HISTORICAL  01/09/2017   IR NEPHROSTOMY EXCHANGE LEFT 01/09/2017 Arne Cleveland, MD [facility]  . IR GENERIC HISTORICAL  01/09/2017   IR NEPHROSTOMY EXCHANGE RIGHT 01/09/2017 Arne Cleveland, MD [facility]  . IR GENERIC HISTORICAL  01/16/2016   IR NEPHROSTOMY EXCHANGE RIGHT 01/16/2016 [facility] OUT REF  . PACEMAKER INSERTION    . PREPATELLAR BURSA EXCISION    . ROTATOR CUFF REPAIR  1995   right shoulder  . TRANSURETHRAL RESECTION OF BLADDER TUMOR N/A 10/17/2015   Procedure: TRANSURETHRAL RESECTION OF BLADDER TUMOR (TURBT);  Surgeon: Royston Cowper, MD;  Location: [facility] ORS;  Service: Urology;  Laterality:  N/A;  . TURP VAPORIZATION  2010    Allergies: Penicillins and Flu virus vaccine  Medications: Prior to Admission medications   Medication Sig Start Date End Date Taking? Authorizing Provider  acetaminophen (TYLENOL) 500 MG tablet Take 500 mg by mouth every 6 (six) hours as needed.   Yes Historical Provider, MD  azelastine (ASTELIN) 0.1 % nasal spray Place 2 sprays into both nostrils 2 (two) times daily. Use in each nostril as directed   Yes Historical Provider, MD  cholecalciferol (VITAMIN D) 1000 units tablet Take 1,000 Units by mouth daily.   Yes  Historical Provider, MD  clindamycin (CLEOCIN) 150 MG capsule Take 150 mg by mouth 3 (three) times daily as needed.  11/27/16  Yes Historical Provider, MD  cyanocobalamin (,VITAMIN B-12,) 1000 MCG/ML injection Inject 1,000 mcg into the muscle every 30 (thirty) days.   Yes Historical Provider, MD  enalapril (VASOTEC) 5 MG tablet Take 5 mg by mouth daily.    Yes Historical Provider, MD  furosemide (LASIX) 20 MG tablet Take 20 mg by mouth daily as needed.    Yes Historical Provider, MD  ondansetron (ZOFRAN) 8 MG tablet Take by mouth. 01/29/17 02/28/17 Yes Historical Provider, MD  simvastatin (ZOCOR) 10 MG tablet Take 10 mg by mouth at bedtime.    Yes Historical Provider, MD  Sodium Chloride Flush (NORMAL SALINE FLUSH) 0.9 % SOLN use 10 milliliter SALINE FLUSHES AS DIRECTED 01/26/17  Yes Shannon A McGowan, PA-C  sotalol (BETAPACE) 80 MG tablet Take 1 tablet (80 mg total) by mouth daily. 02/11/17  Yes Wellington Hampshire, MD     Family History  Problem Relation Age of Onset  . Lymphoma Sister   . Bone cancer Mother   . CAD Father   . CVA Father   . Prostate cancer Neg Hx   . Bladder Cancer Neg Hx   . Kidney cancer Neg Hx     Social History   Social History  . Marital status: Married    Spouse name: N/A  . Number of children: N/A  . Years of education: N/A   Social History Main Topics  . Smoking status: Former Smoker    Types: Cigarettes  . Smokeless tobacco: Former Systems developer     Comment: 1992  . Alcohol use No  . Drug use: No  . Sexual activity: Not on file   Other Topics Concern  . Not on file   Social History Narrative   Lives at home with wife, very independent. Active at baseline.  Retired from [employer]. 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    ECOG Status: 1 - Symptomatic but completely ambulatory  Review of Systems: A 12 point ROS discussed and pertinent positives are indicated in the HPI above.  All other systems are negative.  Review of Systems  Vital Signs: BP 126/74    Pulse 70   Resp 16   Ht 5\' 4"  (1.626 m)   Wt 150 lb (68 kg)   SpO2 97%   BMI 25.75 kg/m   Physical Exam  Mallampati Score:  MD Evaluation Airway: WNL Heart: WNL Abdomen: WNL Chest/ Lungs: WNL ASA  Classification: 2 Mallampati/Airway Score: One  Imaging: Dg Chest 2 View  Result Date: 01/31/2017 CLINICAL DATA:  Mid chest pain, shortness of breath, valve replacement EXAM: CHEST  2 VIEW COMPARISON:  12/25/2015 FINDINGS: There are numerous bilateral pulmonary nodules in the upper and lower lungs, right greater than left of varying sizes. There is no focal consolidation. There  is no pleural effusion or pneumothorax. The heart and mediastinal contours are unremarkable. There is evidence of prior CABG. The osseous structures are unremarkable. IMPRESSION: Numerous bilateral pulmonary nodules in the upper lower lungs, right greater than left of varying sizes. These are new compared with 09/03/2016. Differential considerations include metastatic disease versus septic emboli versus fungal disease. Electronically Signed   By: Kathreen Devoid   On: 01/31/2017 11:47   Ct Angio Chest Pe W And/or Wo Contrast  Result Date: 01/31/2017 CLINICAL DATA:  Chest pain with deep inspiration. History of bladder cancer. EXAM: CT ANGIOGRAPHY CHEST WITH CONTRAST TECHNIQUE: Multidetector CT imaging of the chest was performed using the standard protocol during bolus administration of intravenous contrast. Multiplanar CT image reconstructions and MIPs were obtained to evaluate the vascular anatomy. CONTRAST:  60 cc Isovue 370 IV COMPARISON:  09/03/2016 FINDINGS: Cardiovascular: No filling defects in the pulmonary arteries to suggest pulmonary emboli. Heart is mildly enlarged. Aorta is normal caliber. Scattered aortic and coronary artery calcifications. Prior CABG. Mediastinum/Nodes: Mild right hilar adenopathy with right hilar lymph node measuring up to 2 cm in short axis diameter on image 49. Small and borderline sized  mediastinal lymph nodes. No axillary adenopathy. Lungs/Pleura: Innumerable to bilateral pulmonary nodules compatible with extensive metastases. These are new or enlarged since prior study. Index left upper lobe nodule on image 24 measures 2 cm in greatest dimension. Index posterior right lower lobe nodule/ mass on image 50 measures up to 3.5 cm. No pleural effusions. Upper Abdomen: Imaging into the upper abdomen shows no acute findings. Musculoskeletal: Left pacer in place.  No acute bony abnormality. Review of the MIP images confirms the above findings. IMPRESSION: Innumerable bilateral pulmonary nodules and masses compatible with extensive metastatic disease. No evidence of pulmonary embolus. Mild cardiomegaly. Right hilar adenopathy. Electronically Signed   By: Rolm Baptise M.D.   On: 01/31/2017 12:38    Labs:  CBC:  Recent Labs  11/27/16 0955 01/31/17 1104 02/04/17 1549  WBC 9.5 9.6 10.6  HGB 10.5* 8.6* 8.9*  HCT 32.2* 26.0* 27.5*  PLT 313 410 471*    COAGS: No results for input(s): INR, APTT in the last 8760 hours.  BMP:  Recent Labs  11/27/16 0955 01/31/17 1104 02/04/17 1549  NA 138 136 137  K 4.0 4.0 4.6  CL 105 102 103  CO2 24 25 26   GLUCOSE 97 97 94  BUN 28* 28* 20  CALCIUM 8.7* 8.5* 8.9  CREATININE 2.01* 1.75* 1.58*  GFRNONAA 28* 33* 37*  GFRAA 32* 38* 43*    LIVER FUNCTION TESTS:  Recent Labs  11/27/16 0955 02/04/17 1549  BILITOT 0.5 0.2*  AST 14* 20  ALT 11* 18  ALKPHOS 54 89  PROT 7.8 7.6  ALBUMIN 3.6 3.1*    TUMOR MARKERS: No results for input(s): AFPTM, CEA, CA199, CHROMGRNA in the last 8760 hours.  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  Needs durable venous access.   1.) Right IJ approach single lumen PowerPort.  Thank you for this interesting consult.  I greatly enjoyed meeting [facility] and look forward to participating in their care.  A copy of this report was sent to the requesting  provider on this date.  Electronically Signed: Jacqulynn Cadet 02/12/2017, 10:36 AM

## 2017-02-13 NOTE — Progress Notes (Signed)
Hematology/Oncology Consult note [facility]  [phone] [phone]  Patient Care Team: Kirk Ruths, MD as PCP - General (Internal Medicine)   Name of the patient: Austin Contreras  [id number]  1926-01-19   Date of visit: 02/13/17  Diagnosis- metastatic bladder carcinoma  Chief complaint/ Reason for visit- on treatment assessment prior to starting [place]  Heme/Onc history: Patient is a 83 year old male with multiple comorbidities including coronary artery disease, COPD, BPH, A. fib and history of bladder cancer. He had initially presented with one-year history of hematuria starting in 2014 and underwent CT scan of the abdomen which shows abnormalities in the bladder wall. He had cystoscopy done which showed papillary bladder tumor involving the posterior wall as well as carcinoma in situ. 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 There was evidence of lymphovascular invasion as well as at least subepithelial connective tissue involvement. He also underwent radiation therapy but refused chemotherapy at that time. Currently he has bilateral nephrostomy tubes in place and follows up with urology regularly. Last CT scan from September 2017 showed small stable pulmonary nodules. Bladder wall thickening and calcification which could be treatment related versus residual/recurrent disease. Enlarged external iliac node which was new and suspicious for early nodal metastases. He has a repeat CT abdomen scheduled in March 2018.  Patient was last seen by me on 12/06/2016 and at that point did not want to pursue his bladder cancer further as he was not interested in getting any chemotherapy. I also discussed the possibility of immunotherapy but he did not desire any further workup at that time  Patient presented to the ER with some symptoms of pleuritic chest  pain and underwent CT chest which showed bilateral lung lesions consistent with metastatic disease.  CT abdomen from 02/12/2017 showed:Interval increase in size of right external iliac lymph nodes,the largest approximately 10 mm short axis since prior exam suspicious for nodal metastasis.  He also sees urology for bilateral nephrostomy tubes that were placed given scarring of his urinary bladder in the past and his kidney functions have actually improved.   Patient was seen by me on 02/04/2017 and plan was to proceed with palliative immunotherapy with [facility] as patient was not keen on pursuing chemotherapy  H&H from 02/04/2017 was 8.9/27.5 and anemia workup was consistent with anemia of chronic kidney disease as well as a component of iron deficiency and possible underlying malignancy.  Patient decided to proceed with trial of immunotherapy as 1st line option for his bladder cancer   Interval history- patient still feels fatigued. Has intermittent hematuria. Denies any pain or other complaints.  ECOG PS- 2 Pain scale- 0 Opioid associated constipation- no  Review of systems- Review of Systems  Constitutional: Positive for malaise/fatigue. Negative for chills, fever and weight loss.  HENT: Negative for congestion, ear discharge and nosebleeds.   Eyes: Negative for blurred vision.  Respiratory: Negative for cough, hemoptysis, sputum production, shortness of breath and wheezing.   Cardiovascular: Negative for chest pain, palpitations, orthopnea and claudication.  Gastrointestinal: Negative for abdominal pain, blood in stool, constipation, diarrhea, heartburn, melena, nausea and vomiting.  Genitourinary: Positive for hematuria. Negative for dysuria, flank pain, frequency and urgency.  Musculoskeletal: Negative for back pain, joint pain and myalgias.  Skin: Negative for rash.  Neurological: Negative for dizziness, tingling, focal weakness, seizures, weakness and headaches.    Endo/Heme/Allergies: Does not bruise/bleed easily.  Psychiatric/Behavioral:  Negative for depression and suicidal ideas. The patient does not have insomnia.      Current treatment- keytruda to start today  Allergies  Allergen Reactions  . Penicillins Rash and Other (See Comments)    Has patient had a PCN reaction causing immediate rash, facial/tongue/throat swelling, SOB or lightheadedness with hypotension: No Has patient had a PCN reaction causing severe rash involving mucus membranes or skin necrosis: No Has patient had a PCN reaction that required hospitalization No Has patient had a PCN reaction occurring within the last 10 years: No If all of the above answers are "NO", then may proceed with Cephalosporin use.  Marland Kitchen Flu Virus Vaccine Other (See Comments)    Times 3     Past Medical History:  Diagnosis Date  . B12 deficiency anemia 08/02/2015  . Bladder cancer ([place])    a. stage 2 s/p TUBTR;  b. s/p bilateral nephrostomy tubes.  Marland Kitchen BPH (benign prostatic hyperplasia)   . CKD (chronic kidney disease) stage 3, GFR 30-59 ml/min   . Coronary artery disease    a. 08/2012 s/p CABG x 2 (VG->RCA, VG->OM1) @ [facility];  b. H/o stress test some time after CABG (pt unsure of date) -> reportedly normal.  . GERD (gastroesophageal reflux disease)   . H/O aortic valve replacement with porcine valve    a. 08/2012 84mm Trifecta Biologic Valve.  Marland Kitchen History of GI bleed    a. ~ 2011 - required 6 units PRBC's.  . History of nephrolithiasis   . Hyperlipidemia   . Hypertension   . Mitral regurgitation   . PAF (paroxysmal atrial fibrillation) ([place])    a. Developed post-op AVR/CABG in 08/2012.  Initially treated with amio but did not tolerate.  On sotalol since late 2013.  No OAC despite CHA2DS2VASc = 4 secondary to h/o GIB and chronic hematuria in the setting of bladder cancer s/p nephrostomy tubes.  . Peripheral neuropathy ([place])   . PONV (postoperative nausea and vomiting)   . Presence of permanent cardiac  pacemaker    a. 08/2012 developed heart block/junctional bradycardia following CABG/AVR-->s/p MDT CRM Advisa DR MRI SureScan [id number] Dual Chamber PPM, ser # [id number] H.  . SVT (supraventricular tachycardia) ([place])      Past Surgical History:  Procedure Laterality Date  . AORTIC VALVE REPLACEMENT    . APPENDECTOMY  1942  . BACK SURGERY  1991  . CATARACT EXTRACTION, BILATERAL    . cataracts    . CORONARY ARTERY BYPASS GRAFT  08/2012  . CYSTOSCOPY WITH FULGERATION Bilateral 12/23/2015   Procedure: CYSTOSCOPY WITH FULGERATION;  Surgeon: Royston Cowper, MD;  Location: [facility] ORS;  Service: Urology;  Laterality: Bilateral;  . HAND SURGERY  1976   after trauma  . IR GENERIC HISTORICAL  09/06/2016   IR NEPHROSTOMY EXCHANGE LEFT 09/06/2016 Greggory Keen, MD [facility]  . IR GENERIC HISTORICAL  09/06/2016   IR NEPHROSTOMY EXCHANGE RIGHT 09/06/2016 Greggory Keen, MD [facility]  . IR GENERIC HISTORICAL  11/05/2016   IR NEPHROSTOMY TUBE CHANGE 11/05/2016 Aletta Edouard, MD [facility]  . IR GENERIC HISTORICAL  01/09/2017   IR NEPHROSTOMY EXCHANGE LEFT 01/09/2017 Arne Cleveland, MD [facility]  . IR GENERIC HISTORICAL  01/09/2017   IR NEPHROSTOMY EXCHANGE RIGHT 01/09/2017 Arne Cleveland, MD [facility]  . IR GENERIC HISTORICAL  01/16/2016   IR NEPHROSTOMY EXCHANGE RIGHT 01/16/2016 [facility] OUT REF  . IR GENERIC HISTORICAL  02/12/2017   IR FLUORO GUIDE PORT INSERTION RIGHT 02/12/2017 Jacqulynn Cadet, MD [facility]  .  PACEMAKER INSERTION    . PREPATELLAR BURSA EXCISION    . ROTATOR CUFF REPAIR  1995   right shoulder  . TRANSURETHRAL RESECTION OF BLADDER TUMOR N/A 10/17/2015   Procedure: TRANSURETHRAL RESECTION OF BLADDER TUMOR (TURBT);  Surgeon: Royston Cowper, MD;  Location: [facility] ORS;  Service: Urology;  Laterality: N/A;  . TURP VAPORIZATION  2010    Social History   Social History  . Marital status: Married    Spouse name: N/A  . Number of children: N/A  . Years of  education: N/A   Occupational History  . Not on file.   Social History Main Topics  . Smoking status: Former Smoker    Types: Cigarettes  . Smokeless tobacco: Former Systems developer     Comment: 1992  . Alcohol use No  . Drug use: No  . Sexual activity: Not on file   Other Topics Concern  . Not on file   Social History Narrative   Lives at home with wife, very independent. Active at baseline.  Retired from [employer]. 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    Family History  Problem Relation Age of Onset  . Lymphoma Sister   . Bone cancer Mother   . CAD Father   . CVA Father   . Prostate cancer Neg Hx   . Bladder Cancer Neg Hx   . Kidney cancer Neg Hx      Current Outpatient Prescriptions:  .  acetaminophen (TYLENOL) 500 MG tablet, Take 500 mg by mouth every 6 (six) hours as needed., Disp: , Rfl:  .  azelastine (ASTELIN) 0.1 % nasal spray, Place 2 sprays into both nostrils 2 (two) times daily. Use in each nostril as directed, Disp: , Rfl:  .  cholecalciferol (VITAMIN D) 1000 units tablet, Take 1,000 Units by mouth daily., Disp: , Rfl:  .  clindamycin (CLEOCIN) 150 MG capsule, Take 150 mg by mouth 3 (three) times daily as needed. , Disp: , Rfl:  .  cyanocobalamin (,VITAMIN B-12,) 1000 MCG/ML injection, Inject 1,000 mcg into the muscle every 30 (thirty) days., Disp: , Rfl:  .  enalapril (VASOTEC) 5 MG tablet, Take 5 mg by mouth daily. , Disp: , Rfl:  .  furosemide (LASIX) 20 MG tablet, Take 20 mg by mouth daily as needed. , Disp: , Rfl:  .  ondansetron (ZOFRAN) 8 MG tablet, Take by mouth., Disp: , Rfl:  .  simvastatin (ZOCOR) 10 MG tablet, Take 10 mg by mouth at bedtime. , Disp: , Rfl:  .  Sodium Chloride Flush (NORMAL SALINE FLUSH) 0.9 % SOLN, use 10 milliliter SALINE FLUSHES AS DIRECTED, Disp: 1200 mL, Rfl: 5 .  sotalol (BETAPACE) 80 MG tablet, Take 1 tablet (80 mg total) by mouth daily., Disp: 30 tablet, Rfl: 3 No current facility-administered medications for this visit.    Facility-Administered Medications Ordered in Other Visits:  .  0.9 %  sodium chloride infusion, , Intravenous, Continuous, Monia Sabal, PA-C  Physical exam:  Vitals:   02/14/17 0944  BP: 117/68  Pulse: 73  Resp: 18  Temp: 98.4 F (36.9 C)  TempSrc: Tympanic  Weight: 147 lb 7.8 oz (66.9 kg)   Physical Exam  Constitutional: He is oriented to person, place, and time.  Elderly thin gentleman in no acute distress  HENT:  Head: Normocephalic and atraumatic.  Eyes: EOM are normal. Pupils are equal, round, and reactive to light.  Neck: Normal range of motion.  Cardiovascular: Normal rate, regular rhythm and normal heart  sounds.   Pulmonary/Chest: Effort normal and breath sounds normal.  Abdominal: Soft. Bowel sounds are normal.  Neurological: He is alert and oriented to person, place, and time.  Skin: Skin is warm and dry.     CMP Latest Ref Rng & Units 02/14/2017  Glucose 65 - 99 mg/dL 106(H)  BUN 6 - 20 mg/dL 27(H)  Creatinine 0.61 - 1.24 mg/dL 1.52(H)  Sodium 135 - 145 mmol/L 131(L)  Potassium 3.5 - 5.1 mmol/L 3.9  Chloride 101 - 111 mmol/L 98(L)  CO2 22 - 32 mmol/L 24  Calcium 8.9 - 10.3 mg/dL 8.4(L)  Total Protein 6.5 - 8.1 g/dL 7.3  Total Bilirubin 0.3 - 1.2 mg/dL 0.6  Alkaline Phos 38 - 126 U/L 114  AST 15 - 41 U/L 18  ALT 17 - 63 U/L 17   CBC Latest Ref Rng & Units 02/14/2017  WBC 3.8 - 10.6 K/uL 12.8(H)  Hemoglobin 13.0 - 18.0 g/dL 8.2(L)  Hematocrit 40.0 - 52.0 % 24.8(L)  Platelets 150 - 440 K/uL 438    No images are attached to the encounter.  Ct Abdomen Pelvis Wo Contrast  Result Date: 02/12/2017 CLINICAL DATA:  Bladder cancer with metastasis to the lungs. Evaluate for metastasis elsewhere. EXAM: CT ABDOMEN AND PELVIS WITHOUT CONTRAST TECHNIQUE: Multidetector CT imaging of the abdomen and pelvis was performed following the standard protocol without IV contrast. COMPARISON:  09/03/2016 CT FINDINGS: Lower chest: Variable sized metastatic pulmonary nodules and  masses, the largest visualized is in the right lower lobe posteriorly at 2.6 cm. Borderline cardiomegaly. Right atrial and right ventricular leads are noted. No pericardial effusion or thickening. Hepatobiliary: 11 x 9 mm hypodensity along the falciform ligament in a typical location for partial volume averaging of the falciform ligament or adjacent mild fatty change. Given the limitations of a noncontrast study, no conclusive findings of metastatic disease to the liver. Small nonobstructing gallstones. One calcification measuring 3 mm is along the nondependent wall and could potentially representing a tiny calcified polyp or adherent stone. Nondistended gallbladder. Pancreas: No focal pancreatic mass or ductal dilatation. No peripancreatic inflammation. Spleen: No splenomegaly or mass. Adrenals/Urinary Tract: Normal bilateral adrenal glands. Percutaneous nephrostomy tubes are seen with tips coiled in the renal pelves bilaterally. Nonobstructing left lower pole renal calculi are again identified the largest approximately 8 x 6 mm. Simple appearing 1 cm exophytic cyst off the upper pole of the right kidney medially. Thickened contracted urinary bladder with partially calcified wall is unchanged in appearance. Presumed passage of the distal left ureteral stone since prior exam. Faint mural calcifications noted of the distal right ureter. Stomach/Bowel: Contrast filled distended stomach without focal mural thickening or intraluminal mass. There is normal small bowel rotation without thickening or obstruction. There is scattered colonic diverticulosis without acute diverticulitis. Moderate colonic stool burden is seen. Circular muscle hypertrophy with diverticulosis again noted of the sigmoid colon. Vascular/Lymphatic: Aortic and branch vessel atherosclerosis. Interval increase in size of iliac chain lymph nodes the largest is approximately 10 mm short axis, series 2, image 60 versus 7 mm previously. The previously  noted right external iliac lymph node measuring 8 mm previously has remained stable although there is a slightly larger adjacent lymph node just medial to this measuring approximately 7 mm versus 4 mm previously. Reproductive: Enlarged prostate gland again noted measuring 7.5 x 6 cm transverse by AP versus 6.4 x 5.2 cm previously. Other: Fat containing left inguinal canal. Musculoskeletal: Degenerative disc disease L4-5 and L5-S1 with mild levoconvex curvature of  the lower lumbar spine. Evidence of prior right L4 laminectomy IMPRESSION: 1. Interval increase in size of right external iliac lymph nodes, the largest approximately 10 mm short axis since prior exam suspicious for nodal metastasis. 2. Chronic bladder wall thickening and calcifications. Faint calcifications noted of the distal right ureter may represent residual or local spread of disease. 3. Bilateral percutaneous nephrostomy tubes are in place. Passage of the distal left ureteral stone since prior exam. 4. Innumerable metastatic lesions noted of the visualized lungs. 5. Small hypodensity adjacent to the falciform in a common location for focal fatty infiltration or partial volume averaging of the leaf of the falciform. 6. Left-sided nephrolithiasis. 7. Cholelithiasis. Possible calcified 3 mm polyp or adherent stone also noted. 8. Prostatomegaly slightly increased in size. Electronically Signed   By: Ashley Royalty M.D.   On: 02/12/2017 14:32   Dg Chest 2 View  Result Date: 01/31/2017 CLINICAL DATA:  Mid chest pain, shortness of breath, valve replacement EXAM: CHEST  2 VIEW COMPARISON:  12/25/2015 FINDINGS: There are numerous bilateral pulmonary nodules in the upper and lower lungs, right greater than left of varying sizes. There is no focal consolidation. There is no pleural effusion or pneumothorax. The heart and mediastinal contours are unremarkable. There is evidence of prior CABG. The osseous structures are unremarkable. IMPRESSION: Numerous  bilateral pulmonary nodules in the upper lower lungs, right greater than left of varying sizes. These are new compared with 09/03/2016. Differential considerations include metastatic disease versus septic emboli versus fungal disease. Electronically Signed   By: Kathreen Devoid   On: 01/31/2017 11:47   Ct Angio Chest Pe W And/or Wo Contrast  Result Date: 01/31/2017 CLINICAL DATA:  Chest pain with deep inspiration. History of bladder cancer. EXAM: CT ANGIOGRAPHY CHEST WITH CONTRAST TECHNIQUE: Multidetector CT imaging of the chest was performed using the standard protocol during bolus administration of intravenous contrast. Multiplanar CT image reconstructions and MIPs were obtained to evaluate the vascular anatomy. CONTRAST:  60 cc Isovue 370 IV COMPARISON:  09/03/2016 FINDINGS: Cardiovascular: No filling defects in the pulmonary arteries to suggest pulmonary emboli. Heart is mildly enlarged. Aorta is normal caliber. Scattered aortic and coronary artery calcifications. Prior CABG. Mediastinum/Nodes: Mild right hilar adenopathy with right hilar lymph node measuring up to 2 cm in short axis diameter on image 49. Small and borderline sized mediastinal lymph nodes. No axillary adenopathy. Lungs/Pleura: Innumerable to bilateral pulmonary nodules compatible with extensive metastases. These are new or enlarged since prior study. Index left upper lobe nodule on image 24 measures 2 cm in greatest dimension. Index posterior right lower lobe nodule/ mass on image 50 measures up to 3.5 cm. No pleural effusions. Upper Abdomen: Imaging into the upper abdomen shows no acute findings. Musculoskeletal: Left pacer in place.  No acute bony abnormality. Review of the MIP images confirms the above findings. IMPRESSION: Innumerable bilateral pulmonary nodules and masses compatible with extensive metastatic disease. No evidence of pulmonary embolus. Mild cardiomegaly. Right hilar adenopathy. Electronically Signed   By: Rolm Baptise M.D.    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 He requires durable IV access for immunotherapy. EXAM: IMPLANTED PORT A CATH PLACEMENT WITH ULTRASOUND AND FLUOROSCOPIC GUIDANCE MEDICATIONS: Vancomycin 1 gm IV; The antibiotic was administered within an appropriate time interval prior to skin puncture. ANESTHESIA/SEDATION: Versed 2 mg IV; Fentanyl 75 mcg IV; Moderate Sedation Time:  23  minutes The patient was continuously monitored during the procedure by the interventional radiology nurse under my direct supervision. FLUOROSCOPY TIME:  0 minutes, 24 seconds (4 mGy) COMPLICATIONS: None immediate. PROCEDURE: The right neck and chest was prepped with chlorhexidine, and draped in the usual sterile fashion using maximum barrier technique (cap and mask, sterile gown, sterile gloves, large sterile sheet, hand hygiene and cutaneous antiseptic). Antibiotic prophylaxis was provided with 1g vancomycin administered IV one hour prior to skin incision. Local anesthesia was attained by infiltration with 1% lidocaine with epinephrine. Ultrasound demonstrated patency of the right internal jugular vein, and this was documented with an image. Under real-time ultrasound guidance, this vein was accessed with a 21 gauge micropuncture needle and image documentation was performed. A small dermatotomy was made at the access site with an 11 scalpel. A 0.018" wire was advanced into the SVC and the access needle exchanged for a 47F micropuncture vascular sheath. The 0.018" wire was then removed and a 0.035" wire advanced into the IVC. An appropriate location for the subcutaneous reservoir was selected below the clavicle and an incision was made through the skin and underlying soft tissues. The subcutaneous tissues were then dissected using a combination of blunt and sharp surgical technique and a pocket  was formed. A single lumen power injectable portacatheter was then tunneled through the subcutaneous tissues from the pocket to the dermatotomy and the port reservoir placed within the subcutaneous pocket. The venous access site was then serially dilated and a peel away vascular sheath placed over the wire. The wire was removed and the port catheter advanced into position under fluoroscopic guidance. The catheter tip is positioned in the superior cavoatrial junction. This was documented with a spot image. The portacatheter was then tested and found to flush and aspirate well. The port was flushed with saline followed by 100 units/mL heparinized saline. The pocket was then closed in two layers using first subdermal inverted interrupted absorbable sutures followed by a running subcuticular suture. The epidermis was then sealed with Dermabond. The dermatotomy at the venous access site was also sealed with Dermabond. IMPRESSION: Successful placement of a right IJ approach Power Port with ultrasound and fluoroscopic guidance. The catheter is ready for use. Signed, Criselda Peaches, MD [facility] Radiology Electronically Signed   By: Jacqulynn Cadet M.D.   On: 02/12/2017 15:33     Assessment and plan- Patient is a 83 y.o. male with metastatic bladder carcinoma with lung and LN metastases  1. Counts are okay to proceed with cycle #1 of Keytruda today. He does have baseline CKD which is currently stable. Patient has a port in place. I will see him back in 3 weeks' time prior to the next cycle of treatment. He will call us if there are any questions or concerns in the interim.  2. B12 deficiency- patient has been getting monthly B12 shots with his PCP but since he is going to be having regular visits with we will be giving him those shots here  3. Normocytic anemia- likely multifactorial secondary to anemia of chronic kidney disease as well as underlying iron  deficiency and malignancy. I have suggested that patient should start taking oral iron 325 mg by mouth twice a day starting today. If he is unable to tolerate oral iron we will start him on ferriheme along with his next treatment and will give him 4 doses every [redacted] weeks along with [place]. We will also plan to start him on Aranesp  0.45 mcg/kg sub cut along with next treatment after a three-week oral iron supplementation starting today.   Visit Diagnosis 1. Malignant neoplasm of posterior wall of urinary bladder (HCC)   2. B12 deficiency   3. Anemia of chronic renal failure, unspecified CKD stage      Dr. Randa Evens, MD, MPH [facility] Pager- [phone] 02/14/2017 12:55 PM

## 2017-02-14 ENCOUNTER — Other Ambulatory Visit: Payer: Self-pay | Admitting: *Deleted

## 2017-02-14 ENCOUNTER — Inpatient Hospital Stay: Payer: Medicare Other | Attending: Oncology

## 2017-02-14 ENCOUNTER — Inpatient Hospital Stay: Payer: Medicare Other

## 2017-02-14 ENCOUNTER — Inpatient Hospital Stay (HOSPITAL_BASED_OUTPATIENT_CLINIC_OR_DEPARTMENT_OTHER): Payer: Medicare Other | Admitting: Oncology

## 2017-02-14 VITALS — BP 117/68 | HR 73 | Temp 98.4°F | Resp 18 | Wt 147.5 lb

## 2017-02-14 DIAGNOSIS — Z87891 Personal history of nicotine dependence: Secondary | ICD-10-CM | POA: Insufficient documentation

## 2017-02-14 DIAGNOSIS — N189 Chronic kidney disease, unspecified: Secondary | ICD-10-CM

## 2017-02-14 DIAGNOSIS — E785 Hyperlipidemia, unspecified: Secondary | ICD-10-CM | POA: Insufficient documentation

## 2017-02-14 DIAGNOSIS — D519 Vitamin B12 deficiency anemia, unspecified: Secondary | ICD-10-CM | POA: Diagnosis not present

## 2017-02-14 DIAGNOSIS — I480 Paroxysmal atrial fibrillation: Secondary | ICD-10-CM | POA: Diagnosis not present

## 2017-02-14 DIAGNOSIS — N183 Chronic kidney disease, stage 3 (moderate): Secondary | ICD-10-CM

## 2017-02-14 DIAGNOSIS — M5136 Other intervertebral disc degeneration, lumbar region: Secondary | ICD-10-CM | POA: Diagnosis not present

## 2017-02-14 DIAGNOSIS — Z923 Personal history of irradiation: Secondary | ICD-10-CM

## 2017-02-14 DIAGNOSIS — I471 Supraventricular tachycardia: Secondary | ICD-10-CM | POA: Diagnosis not present

## 2017-02-14 DIAGNOSIS — Z87442 Personal history of urinary calculi: Secondary | ICD-10-CM | POA: Diagnosis not present

## 2017-02-14 DIAGNOSIS — C674 Malignant neoplasm of posterior wall of bladder: Secondary | ICD-10-CM

## 2017-02-14 DIAGNOSIS — Z950 Presence of cardiac pacemaker: Secondary | ICD-10-CM | POA: Insufficient documentation

## 2017-02-14 DIAGNOSIS — J449 Chronic obstructive pulmonary disease, unspecified: Secondary | ICD-10-CM | POA: Insufficient documentation

## 2017-02-14 DIAGNOSIS — E538 Deficiency of other specified B group vitamins: Secondary | ICD-10-CM

## 2017-02-14 DIAGNOSIS — D631 Anemia in chronic kidney disease: Secondary | ICD-10-CM

## 2017-02-14 DIAGNOSIS — K8020 Calculus of gallbladder without cholecystitis without obstruction: Secondary | ICD-10-CM | POA: Insufficient documentation

## 2017-02-14 DIAGNOSIS — C7800 Secondary malignant neoplasm of unspecified lung: Secondary | ICD-10-CM | POA: Diagnosis not present

## 2017-02-14 DIAGNOSIS — N202 Calculus of kidney with calculus of ureter: Secondary | ICD-10-CM | POA: Insufficient documentation

## 2017-02-14 DIAGNOSIS — I340 Nonrheumatic mitral (valve) insufficiency: Secondary | ICD-10-CM | POA: Insufficient documentation

## 2017-02-14 DIAGNOSIS — Z79899 Other long term (current) drug therapy: Secondary | ICD-10-CM

## 2017-02-14 DIAGNOSIS — Z5111 Encounter for antineoplastic chemotherapy: Secondary | ICD-10-CM | POA: Insufficient documentation

## 2017-02-14 DIAGNOSIS — Z951 Presence of aortocoronary bypass graft: Secondary | ICD-10-CM | POA: Insufficient documentation

## 2017-02-14 DIAGNOSIS — K219 Gastro-esophageal reflux disease without esophagitis: Secondary | ICD-10-CM | POA: Diagnosis not present

## 2017-02-14 DIAGNOSIS — Z953 Presence of xenogenic heart valve: Secondary | ICD-10-CM | POA: Insufficient documentation

## 2017-02-14 DIAGNOSIS — I2510 Atherosclerotic heart disease of native coronary artery without angina pectoris: Secondary | ICD-10-CM | POA: Insufficient documentation

## 2017-02-14 DIAGNOSIS — I129 Hypertensive chronic kidney disease with stage 1 through stage 4 chronic kidney disease, or unspecified chronic kidney disease: Secondary | ICD-10-CM | POA: Insufficient documentation

## 2017-02-14 DIAGNOSIS — N400 Enlarged prostate without lower urinary tract symptoms: Secondary | ICD-10-CM | POA: Diagnosis not present

## 2017-02-14 DIAGNOSIS — C779 Secondary and unspecified malignant neoplasm of lymph node, unspecified: Secondary | ICD-10-CM | POA: Insufficient documentation

## 2017-02-14 LAB — COMPREHENSIVE METABOLIC PANEL
ALBUMIN: 2.7 g/dL — AB (ref 3.5–5.0)
ALT: 17 U/L (ref 17–63)
AST: 18 U/L (ref 15–41)
Alkaline Phosphatase: 114 U/L (ref 38–126)
Anion gap: 9 (ref 5–15)
BUN: 27 mg/dL — AB (ref 6–20)
CHLORIDE: 98 mmol/L — AB (ref 101–111)
CO2: 24 mmol/L (ref 22–32)
Calcium: 8.4 mg/dL — ABNORMAL LOW (ref 8.9–10.3)
Creatinine, Ser: 1.52 mg/dL — ABNORMAL HIGH (ref 0.61–1.24)
GFR calc Af Amer: 45 mL/min — ABNORMAL LOW (ref >60)
GFR, EST NON AFRICAN AMERICAN: 39 mL/min — AB (ref >60)
GLUCOSE: 106 mg/dL — AB (ref 65–99)
Potassium: 3.9 mmol/L (ref 3.5–5.1)
Sodium: 131 mmol/L — ABNORMAL LOW (ref 135–145)
Total Bilirubin: 0.6 mg/dL (ref 0.3–1.2)
Total Protein: 7.3 g/dL (ref 6.5–8.1)

## 2017-02-14 LAB — CBC WITH DIFFERENTIAL/PLATELET
BASOS ABS: 0 10*3/uL (ref 0–0.1)
BASOS PCT: 0 %
EOS PCT: 0 %
Eosinophils Absolute: 0 10*3/uL (ref 0–0.7)
HCT: 24.8 % — ABNORMAL LOW (ref 40.0–52.0)
Hemoglobin: 8.2 g/dL — ABNORMAL LOW (ref 13.0–18.0)
LYMPHS PCT: 11 %
Lymphs Abs: 1.4 10*3/uL (ref 1.0–3.6)
MCH: 26.6 pg (ref 26.0–34.0)
MCHC: 33 g/dL (ref 32.0–36.0)
MCV: 80.5 fL (ref 80.0–100.0)
Monocytes Absolute: 0.9 10*3/uL (ref 0.2–1.0)
Monocytes Relative: 7 %
NEUTROS ABS: 10.5 10*3/uL — AB (ref 1.4–6.5)
Neutrophils Relative %: 82 %
PLATELETS: 438 10*3/uL (ref 150–440)
RBC: 3.08 MIL/uL — AB (ref 4.40–5.90)
RDW: 17.5 % — ABNORMAL HIGH (ref 11.5–14.5)
WBC: 12.8 10*3/uL — AB (ref 3.8–10.6)

## 2017-02-14 MED ORDER — SODIUM CHLORIDE 0.9 % IV SOLN
Freq: Once | INTRAVENOUS | Status: AC
  Administered 2017-02-14: 11:00:00 via INTRAVENOUS
  Filled 2017-02-14: qty 1000

## 2017-02-14 MED ORDER — LIDOCAINE-PRILOCAINE 2.5-2.5 % EX CREA: 1.0000 "application " | TOPICAL_CREAM | CUTANEOUS | 2 refills | Status: AC | PRN

## 2017-02-14 MED ORDER — CYANOCOBALAMIN 1000 MCG/ML IJ SOLN
1000.0000 ug | Freq: Once | INTRAMUSCULAR | Status: AC
  Administered 2017-02-14: 1000 ug via INTRAMUSCULAR
  Filled 2017-02-14: qty 1

## 2017-02-14 MED ORDER — HEPARIN SOD (PORK) LOCK FLUSH 100 UNIT/ML IV SOLN
500.0000 [IU] | Freq: Once | INTRAVENOUS | Status: AC
  Administered 2017-02-14: 500 [IU] via INTRAVENOUS
  Filled 2017-02-14: qty 5

## 2017-02-14 MED ORDER — SODIUM CHLORIDE 0.9 % IV SOLN
200.0000 mg | Freq: Once | INTRAVENOUS | Status: AC
  Administered 2017-02-14: 200 mg via INTRAVENOUS
  Filled 2017-02-14: qty 8

## 2017-02-14 MED ORDER — SODIUM CHLORIDE 0.9% FLUSH
10.0000 mL | Freq: Once | INTRAVENOUS | Status: AC
  Administered 2017-02-14: 10 mL via INTRAVENOUS
  Filled 2017-02-14: qty 10

## 2017-02-14 NOTE — Progress Notes (Signed)
Creatinine: 1.52. MD, Dr. Janese Banks, notified via telephone and already aware. Per MD order: proceed with scheduled treatment today.

## 2017-02-14 NOTE — Progress Notes (Signed)
Patient states he has SOB- more with exertion.  Also not eating.  Has a bad taste in his mouth.  Also states he has drainage that gags him and he doesn't want anything to eat.  Does use [employer] instant breakfast.

## 2017-02-21 ENCOUNTER — Ambulatory Visit
Admission: RE | Admit: 2017-02-21 | Discharge: 2017-02-21 | Disposition: A | Discharge status: Home / Self Care | Payer: Medicare Other | Source: Ambulatory Visit | Attending: Urology | Admitting: Urology

## 2017-02-21 DIAGNOSIS — C7800 Secondary malignant neoplasm of unspecified lung: Secondary | ICD-10-CM | POA: Diagnosis not present

## 2017-02-21 DIAGNOSIS — N3040 Irradiation cystitis without hematuria: Secondary | ICD-10-CM | POA: Insufficient documentation

## 2017-02-21 DIAGNOSIS — Z950 Presence of cardiac pacemaker: Secondary | ICD-10-CM | POA: Diagnosis not present

## 2017-02-21 DIAGNOSIS — Z9889 Other specified postprocedural states: Secondary | ICD-10-CM | POA: Diagnosis not present

## 2017-02-21 DIAGNOSIS — D519 Vitamin B12 deficiency anemia, unspecified: Secondary | ICD-10-CM | POA: Diagnosis not present

## 2017-02-21 DIAGNOSIS — Z951 Presence of aortocoronary bypass graft: Secondary | ICD-10-CM | POA: Insufficient documentation

## 2017-02-21 DIAGNOSIS — Z87442 Personal history of urinary calculi: Secondary | ICD-10-CM | POA: Insufficient documentation

## 2017-02-21 DIAGNOSIS — E785 Hyperlipidemia, unspecified: Secondary | ICD-10-CM | POA: Insufficient documentation

## 2017-02-21 DIAGNOSIS — N183 Chronic kidney disease, stage 3 (moderate): Secondary | ICD-10-CM | POA: Diagnosis not present

## 2017-02-21 DIAGNOSIS — K219 Gastro-esophageal reflux disease without esophagitis: Secondary | ICD-10-CM | POA: Insufficient documentation

## 2017-02-21 DIAGNOSIS — G629 Polyneuropathy, unspecified: Secondary | ICD-10-CM | POA: Diagnosis not present

## 2017-02-21 DIAGNOSIS — I340 Nonrheumatic mitral (valve) insufficiency: Secondary | ICD-10-CM | POA: Insufficient documentation

## 2017-02-21 DIAGNOSIS — I129 Hypertensive chronic kidney disease with stage 1 through stage 4 chronic kidney disease, or unspecified chronic kidney disease: Secondary | ICD-10-CM | POA: Diagnosis not present

## 2017-02-21 DIAGNOSIS — N400 Enlarged prostate without lower urinary tract symptoms: Secondary | ICD-10-CM | POA: Diagnosis not present

## 2017-02-21 DIAGNOSIS — I2510 Atherosclerotic heart disease of native coronary artery without angina pectoris: Secondary | ICD-10-CM | POA: Insufficient documentation

## 2017-02-21 DIAGNOSIS — I480 Paroxysmal atrial fibrillation: Secondary | ICD-10-CM | POA: Insufficient documentation

## 2017-02-21 DIAGNOSIS — C679 Malignant neoplasm of bladder, unspecified: Secondary | ICD-10-CM | POA: Insufficient documentation

## 2017-02-21 DIAGNOSIS — I471 Supraventricular tachycardia: Secondary | ICD-10-CM | POA: Diagnosis not present

## 2017-02-21 DIAGNOSIS — Z87891 Personal history of nicotine dependence: Secondary | ICD-10-CM | POA: Diagnosis not present

## 2017-02-21 HISTORY — PX: IR GENERIC HISTORICAL: IMG1180011

## 2017-02-21 MED ORDER — SODIUM CHLORIDE 0.9 % IV SOLN
INTRAVENOUS | Status: DC
  Administered 2017-02-21: 08:00:00 via INTRAVENOUS

## 2017-02-21 MED ORDER — MIDAZOLAM HCL 5 MG/5ML IJ SOLN
INTRAMUSCULAR | Status: AC
  Filled 2017-02-21: qty 5

## 2017-02-21 MED ORDER — HEPARIN SOD (PORK) LOCK FLUSH 100 UNIT/ML IV SOLN
INTRAVENOUS | Status: AC
  Filled 2017-02-21: qty 5

## 2017-02-21 MED ORDER — FENTANYL CITRATE (PF) 100 MCG/2ML IJ SOLN
INTRAMUSCULAR | Status: AC | PRN
  Administered 2017-02-21 (×2): 25 ug via INTRAVENOUS

## 2017-02-21 MED ORDER — FENTANYL CITRATE (PF) 100 MCG/2ML IJ SOLN
INTRAMUSCULAR | Status: AC
  Filled 2017-02-21: qty 2

## 2017-02-21 MED ORDER — IOPAMIDOL (ISOVUE-300) INJECTION 61%
20.0000 mL | Freq: Once | INTRAVENOUS | Status: AC | PRN
  Administered 2017-02-21: 09:00:00 15 mL via INTRAVENOUS

## 2017-02-21 NOTE — H&P (Signed)
Chief Complaint: Patient was seen in consultation today for bilateral nephrostomy tube exchanges at the request of [place]  Referring Physician(s): [place]  Patient Status: [facility] - Out-pt  History of Present Illness: Austin Contreras is a 83 y.o. male who is known to the interventional radiology service. Patient has metastatic bladder cancer and is currently undergoing therapy. Patient has long-standing bilateral nephrostomy tubes and he presents for routine exchange. Patient reports no problems with tubes since his last exchange procedure. Both drains are working. He complains of coughing up bloody phlegm every morning. He says this has been going on for a long time. In addition, the patient complains of poor appetite and unwanted weight loss. Patient has no pain. He denies fevers or chills.  Past Medical History:  Diagnosis Date  . B12 deficiency anemia 08/02/2015  . Bladder cancer ([place])    a. stage 2 s/p TUBTR;  b. s/p bilateral nephrostomy tubes.  Marland Kitchen BPH (benign prostatic hyperplasia)   . CKD (chronic kidney disease) stage 3, GFR 30-59 ml/min   . Coronary artery disease    a. 08/2012 s/p CABG x 2 (VG->RCA, VG->OM1) @ [facility];  b. H/o stress test some time after CABG (pt unsure of date) -> reportedly normal.  . GERD (gastroesophageal reflux disease)   . H/O aortic valve replacement with porcine valve    a. 08/2012 41mm Trifecta Biologic Valve.  Marland Kitchen History of GI bleed    a. ~ 2011 - required 6 units PRBC's.  . History of nephrolithiasis   . Hyperlipidemia   . Hypertension   . Mitral regurgitation   . PAF (paroxysmal atrial fibrillation) ([place])    a. Developed post-op AVR/CABG in 08/2012.  Initially treated with amio but did not tolerate.  On sotalol since late 2013.  No OAC despite CHA2DS2VASc = 4 secondary to h/o GIB and chronic hematuria in the setting of bladder cancer s/p nephrostomy tubes.  . Peripheral neuropathy ([place])   . PONV (postoperative nausea and vomiting)   .  Presence of permanent cardiac pacemaker    a. 08/2012 developed heart block/junctional bradycardia following CABG/AVR-->s/p MDT CRM Advisa DR MRI SureScan [id number] Dual Chamber PPM, ser # [id number] H.  . SVT (supraventricular tachycardia) ([place])     Past Surgical History:  Procedure Laterality Date  . AORTIC VALVE REPLACEMENT    . APPENDECTOMY  1942  . BACK SURGERY  1991  . CATARACT EXTRACTION, BILATERAL    . cataracts    . CORONARY ARTERY BYPASS GRAFT  08/2012  . CYSTOSCOPY WITH FULGERATION Bilateral 12/23/2015   Procedure: CYSTOSCOPY WITH FULGERATION;  Surgeon: Royston Cowper, MD;  Location: [facility] ORS;  Service: Urology;  Laterality: Bilateral;  . HAND SURGERY  1976   after trauma  . IR GENERIC HISTORICAL  09/06/2016   IR NEPHROSTOMY EXCHANGE LEFT 09/06/2016 Greggory Keen, MD [facility]  . IR GENERIC HISTORICAL  09/06/2016   IR NEPHROSTOMY EXCHANGE RIGHT 09/06/2016 Greggory Keen, MD [facility]  . IR GENERIC HISTORICAL  11/05/2016   IR NEPHROSTOMY TUBE CHANGE 11/05/2016 Aletta Edouard, MD [facility]  . IR GENERIC HISTORICAL  01/09/2017   IR NEPHROSTOMY EXCHANGE LEFT 01/09/2017 Arne Cleveland, MD [facility]  . IR GENERIC HISTORICAL  01/09/2017   IR NEPHROSTOMY EXCHANGE RIGHT 01/09/2017 Arne Cleveland, MD [facility]  . IR GENERIC HISTORICAL  01/16/2016   IR NEPHROSTOMY EXCHANGE RIGHT 01/16/2016 [facility] OUT REF  . IR GENERIC HISTORICAL  02/12/2017   IR FLUORO GUIDE PORT INSERTION RIGHT 02/12/2017 Jacqulynn Cadet, MD  [facility]  . PACEMAKER INSERTION    . PREPATELLAR BURSA EXCISION    . ROTATOR CUFF REPAIR  1995   right shoulder  . TRANSURETHRAL RESECTION OF BLADDER TUMOR N/A 10/17/2015   Procedure: TRANSURETHRAL RESECTION OF BLADDER TUMOR (TURBT);  Surgeon: Royston Cowper, MD;  Location: [facility] ORS;  Service: Urology;  Laterality: N/A;  . TURP VAPORIZATION  2010    Allergies: Penicillins and Flu virus vaccine  Medications: Prior to Admission medications     Medication Sig Start Date End Date Taking? Authorizing Provider  acetaminophen (TYLENOL) 500 MG tablet Take 500 mg by mouth every 6 (six) hours as needed.   Yes Historical Provider, MD  azelastine (ASTELIN) 0.1 % nasal spray Place 2 sprays into both nostrils 2 (two) times daily. Use in each nostril as directed   Yes Historical Provider, MD  cholecalciferol (VITAMIN D) 1000 units tablet Take 1,000 Units by mouth daily.   Yes Historical Provider, MD  cyanocobalamin (,VITAMIN B-12,) 1000 MCG/ML injection Inject 1,000 mcg into the muscle every 30 (thirty) days.   Yes Historical Provider, MD  enalapril (VASOTEC) 5 MG tablet Take 5 mg by mouth daily.    Yes Historical Provider, MD  lidocaine-prilocaine (EMLA) cream Apply 1 application topically as needed. Apply to port 1-2 hours (cover with wrap) before chemotherapy appt 02/14/17  Yes Sindy Guadeloupe, MD  ondansetron (ZOFRAN) 8 MG tablet Take by mouth. 01/29/17 02/28/17 Yes Historical Provider, MD  simvastatin (ZOCOR) 10 MG tablet Take 10 mg by mouth at bedtime.    Yes Historical Provider, MD  sotalol (BETAPACE) 80 MG tablet Take 1 tablet (80 mg total) by mouth daily. 02/11/17  Yes Wellington Hampshire, MD  clindamycin (CLEOCIN) 150 MG capsule Take 150 mg by mouth 3 (three) times daily as needed.  11/27/16   Historical Provider, MD  furosemide (LASIX) 20 MG tablet Take 20 mg by mouth daily as needed.     Historical Provider, MD  Sodium Chloride Flush (NORMAL SALINE FLUSH) 0.9 % SOLN use 10 milliliter SALINE FLUSHES AS DIRECTED 01/26/17   Nori Riis, PA-C     Family History  Problem Relation Age of Onset  . Lymphoma Sister   . Bone cancer Mother   . CAD Father   . CVA Father   . Prostate cancer Neg Hx   . Bladder Cancer Neg Hx   . Kidney cancer Neg Hx     Social History   Social History  . Marital status: Married    Spouse name: N/A  . Number of children: N/A  . Years of education: N/A   Social History Main Topics  . Smoking status: Former  Smoker    Types: Cigarettes  . Smokeless tobacco: Former Systems developer     Comment: 1992  . Alcohol use No  . Drug use: No  . Sexual activity: Not Asked   Other Topics Concern  . None   Social History Narrative   Lives at home with wife, very independent. Active at baseline.  Retired from [employer]. 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    Review of Systems: A 12 point ROS discussed and pertinent positives are indicated in the HPI above.  All other systems are negative.  Review of Systems  Constitutional: Positive for appetite change and unexpected weight change.  Respiratory: Positive for cough.   Cardiovascular: Negative.   Gastrointestinal: Negative.     Vital Signs: BP 114/60   Pulse 72   Temp 98.7 F (37.1  C) (Oral)   Resp 16   SpO2 96%   Physical Exam  Constitutional: No distress.  Cardiovascular: Normal rate.   Murmur heard. Systolic murmur.  Pulmonary/Chest: Effort normal and breath sounds normal.  Abdominal: Soft. He exhibits no distension. There is no tenderness.  Genitourinary:  Genitourinary Comments: Bilateral nephrostomy tube are intact. Yellow urine in both bags.    Mallampati Score:  MD Evaluation Airway: WNL Heart: WNL Abdomen: WNL Chest/ Lungs: WNL ASA  Classification: 3 Mallampati/Airway Score: One  Imaging: Ct Abdomen Pelvis Wo Contrast  Result Date: 02/12/2017 CLINICAL DATA:  Bladder cancer with metastasis to the lungs. Evaluate for metastasis elsewhere. EXAM: CT ABDOMEN AND PELVIS WITHOUT CONTRAST TECHNIQUE: Multidetector CT imaging of the abdomen and pelvis was performed following the standard protocol without IV contrast. COMPARISON:  09/03/2016 CT FINDINGS: Lower chest: Variable sized metastatic pulmonary nodules and masses, the largest visualized is in the right lower lobe posteriorly at 2.6 cm. Borderline cardiomegaly. Right atrial and right ventricular leads are noted. No pericardial effusion or thickening. Hepatobiliary: 11 x 9 mm  hypodensity along the falciform ligament in a typical location for partial volume averaging of the falciform ligament or adjacent mild fatty change. Given the limitations of a noncontrast study, no conclusive findings of metastatic disease to the liver. Small nonobstructing gallstones. One calcification measuring 3 mm is along the nondependent wall and could potentially representing a tiny calcified polyp or adherent stone. Nondistended gallbladder. Pancreas: No focal pancreatic mass or ductal dilatation. No peripancreatic inflammation. Spleen: No splenomegaly or mass. Adrenals/Urinary Tract: Normal bilateral adrenal glands. Percutaneous nephrostomy tubes are seen with tips coiled in the renal pelves bilaterally. Nonobstructing left lower pole renal calculi are again identified the largest approximately 8 x 6 mm. Simple appearing 1 cm exophytic cyst off the upper pole of the right kidney medially. Thickened contracted urinary bladder with partially calcified wall is unchanged in appearance. Presumed passage of the distal left ureteral stone since prior exam. Faint mural calcifications noted of the distal right ureter. Stomach/Bowel: Contrast filled distended stomach without focal mural thickening or intraluminal mass. There is normal small bowel rotation without thickening or obstruction. There is scattered colonic diverticulosis without acute diverticulitis. Moderate colonic stool burden is seen. Circular muscle hypertrophy with diverticulosis again noted of the sigmoid colon. Vascular/Lymphatic: Aortic and branch vessel atherosclerosis. Interval increase in size of iliac chain lymph nodes the largest is approximately 10 mm short axis, series 2, image 60 versus 7 mm previously. The previously noted right external iliac lymph node measuring 8 mm previously has remained stable although there is a slightly larger adjacent lymph node just medial to this measuring approximately 7 mm versus 4 mm previously. Reproductive:  Enlarged prostate gland again noted measuring 7.5 x 6 cm transverse by AP versus 6.4 x 5.2 cm previously. Other: Fat containing left inguinal canal. Musculoskeletal: Degenerative disc disease L4-5 and L5-S1 with mild levoconvex curvature of the lower lumbar spine. Evidence of prior right L4 laminectomy IMPRESSION: 1. Interval increase in size of right external iliac lymph nodes, the largest approximately 10 mm short axis since prior exam suspicious for nodal metastasis. 2. Chronic bladder wall thickening and calcifications. Faint calcifications noted of the distal right ureter may represent residual or local spread of disease. 3. Bilateral percutaneous nephrostomy tubes are in place. Passage of the distal left ureteral stone since prior exam. 4. Innumerable metastatic lesions noted of the visualized lungs. 5. Small hypodensity adjacent to the falciform in a common location for focal fatty infiltration  or partial volume averaging of the leaf of the falciform. 6. Left-sided nephrolithiasis. 7. Cholelithiasis. Possible calcified 3 mm polyp or adherent stone also noted. 8. Prostatomegaly slightly increased in size. Electronically Signed   By: Ashley Royalty M.D.   On: 02/12/2017 14:32   Dg Chest 2 View  Result Date: 01/31/2017 CLINICAL DATA:  Mid chest pain, shortness of breath, valve replacement EXAM: CHEST  2 VIEW COMPARISON:  12/25/2015 FINDINGS: There are numerous bilateral pulmonary nodules in the upper and lower lungs, right greater than left of varying sizes. There is no focal consolidation. There is no pleural effusion or pneumothorax. The heart and mediastinal contours are unremarkable. There is evidence of prior CABG. The osseous structures are unremarkable. IMPRESSION: Numerous bilateral pulmonary nodules in the upper lower lungs, right greater than left of varying sizes. These are new compared with 09/03/2016. Differential considerations include metastatic disease versus septic emboli versus fungal disease.  Electronically Signed   By: Kathreen Devoid   On: 01/31/2017 11:47   Ct Angio Chest Pe W And/or Wo Contrast  Result Date: 01/31/2017 CLINICAL DATA:  Chest pain with deep inspiration. History of bladder cancer. EXAM: CT ANGIOGRAPHY CHEST WITH CONTRAST TECHNIQUE: Multidetector CT imaging of the chest was performed using the standard protocol during bolus administration of intravenous contrast. Multiplanar CT image reconstructions and MIPs were obtained to evaluate the vascular anatomy. CONTRAST:  60 cc Isovue 370 IV COMPARISON:  09/03/2016 FINDINGS: Cardiovascular: No filling defects in the pulmonary arteries to suggest pulmonary emboli. Heart is mildly enlarged. Aorta is normal caliber. Scattered aortic and coronary artery calcifications. Prior CABG. Mediastinum/Nodes: Mild right hilar adenopathy with right hilar lymph node measuring up to 2 cm in short axis diameter on image 49. Small and borderline sized mediastinal lymph nodes. No axillary adenopathy. Lungs/Pleura: Innumerable to bilateral pulmonary nodules compatible with extensive metastases. These are new or enlarged since prior study. Index left upper lobe nodule on image 24 measures 2 cm in greatest dimension. Index posterior right lower lobe nodule/ mass on image 50 measures up to 3.5 cm. No pleural effusions. Upper Abdomen: Imaging into the upper abdomen shows no acute findings. Musculoskeletal: Left pacer in place.  No acute bony abnormality. Review of the MIP images confirms the above findings. IMPRESSION: Innumerable bilateral pulmonary nodules and masses compatible with extensive metastatic disease. No evidence of pulmonary embolus. Mild cardiomegaly. Right hilar adenopathy. Electronically Signed   By: Rolm Baptise M.D.   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 He requires durable IV access for  immunotherapy. EXAM: IMPLANTED PORT A CATH PLACEMENT WITH ULTRASOUND AND FLUOROSCOPIC GUIDANCE MEDICATIONS: Vancomycin 1 gm IV; The antibiotic was administered within an appropriate time interval prior to skin puncture. ANESTHESIA/SEDATION: Versed 2 mg IV; Fentanyl 75 mcg IV; Moderate Sedation Time:  23 minutes The patient was continuously monitored during the procedure by the interventional radiology nurse under my direct supervision. FLUOROSCOPY TIME:  0 minutes, 24 seconds (4 mGy) COMPLICATIONS: None immediate. PROCEDURE: The right neck and chest was prepped with chlorhexidine, and draped in the usual sterile fashion using maximum barrier technique (cap and mask, sterile gown, sterile gloves, large sterile sheet, hand hygiene and cutaneous antiseptic). Antibiotic prophylaxis was provided with 1g vancomycin administered IV one hour prior to skin incision. Local anesthesia was attained by infiltration with 1% lidocaine with epinephrine. Ultrasound demonstrated patency of the right internal jugular vein,  and this was documented with an image. Under real-time ultrasound guidance, this vein was accessed with a 21 gauge micropuncture needle and image documentation was performed. A small dermatotomy was made at the access site with an 11 scalpel. A 0.018" wire was advanced into the SVC and the access needle exchanged for a 35F micropuncture vascular sheath. The 0.018" wire was then removed and a 0.035" wire advanced into the IVC. An appropriate location for the subcutaneous reservoir was selected below the clavicle and an incision was made through the skin and underlying soft tissues. The subcutaneous tissues were then dissected using a combination of blunt and sharp surgical technique and a pocket was formed. A single lumen power injectable portacatheter was then tunneled through the subcutaneous tissues from the pocket to the dermatotomy and the port reservoir placed within the subcutaneous pocket. The venous access  site was then serially dilated and a peel away vascular sheath placed over the wire. The wire was removed and the port catheter advanced into position under fluoroscopic guidance. The catheter tip is positioned in the superior cavoatrial junction. This was documented with a spot image. The portacatheter was then tested and found to flush and aspirate well. The port was flushed with saline followed by 100 units/mL heparinized saline. The pocket was then closed in two layers using first subdermal inverted interrupted absorbable sutures followed by a running subcuticular suture. The epidermis was then sealed with Dermabond. The dermatotomy at the venous access site was also sealed with Dermabond. IMPRESSION: Successful placement of a right IJ approach Power Port with ultrasound and fluoroscopic guidance. The catheter is ready for use. Signed, Criselda Peaches, MD [facility] Electronically Signed   By: Jacqulynn Cadet M.D.   On: 02/12/2017 15:33    Labs:  CBC:  Recent Labs  01/31/17 1104 02/04/17 1549 02/12/17 1027 02/14/17 0916  WBC 9.6 10.6 11.6* 12.8*  HGB 8.6* 8.9* 8.6* 8.2*  HCT 26.0* 27.5* 25.6* 24.8*  PLT 410 471* 438 438    COAGS:  Recent Labs  02/12/17 1027  INR 1.19  APTT 39*    BMP:  Recent Labs  01/31/17 1104 02/04/17 1549 02/12/17 1027 02/14/17 0916  NA 136 137 135 131*  K 4.0 4.6 4.5 3.9  CL 102 103 101 98*  CO2 25 26 25 24   GLUCOSE 97 94 102* 106*  BUN 28* 20 28* 27*  CALCIUM 8.5* 8.9 8.8* 8.4*  CREATININE 1.75* 1.58* 1.62* 1.52*  GFRNONAA 33* 37* 36* 39*  GFRAA 38* 43* 42* 45*    LIVER FUNCTION TESTS:  Recent Labs  11/27/16 0955 02/04/17 1549 02/14/17 0916  BILITOT 0.5 0.2* 0.6  AST 14* 20 18  ALT 11* 18 17  ALKPHOS 54 89 114  PROT 7.8 7.6 7.3  ALBUMIN 3.6 3.1* 2.7*    TUMOR MARKERS: No results for input(s): AFPTM, CEA, CA199, CHROMGRNA in the last 8760 hours.  Assessment and  Plan:  83 year old with metastatic bladder cancer and bilateral percutaneous nephrostomy tubes. Patient presents for routine exchange. Patient has had multiple drain exchanges in the past and he is very familiar with the procedure. Informed consent was obtained from the patient. Patient is very sensitive to even mild pain and requests moderate sedation for this procedure. Plan for tube exchange with moderate sedation.  Thank you for this interesting consult.  I greatly enjoyed meeting KIAN OTTAVIANO and look forward to participating in their care.  A copy of this report was  sent to the requesting provider on this date.  Electronically Signed: Carylon Perches 02/21/2017, 8:11 AM   I spent a total of    10 Minutes in face to face in clinical consultation, greater than 50% of which was counseling/coordinating care for nephrostomy tube exchanges.

## 2017-02-21 NOTE — Procedures (Signed)
Successful exchange of bilateral nephrostomy tubes.  No blood loss.  No immediate complication.  Discharge home today.

## 2017-02-28 ENCOUNTER — Ambulatory Visit: Payer: Medicare Other

## 2017-03-07 ENCOUNTER — Inpatient Hospital Stay: Payer: Medicare Other

## 2017-03-07 ENCOUNTER — Telehealth: Payer: Self-pay | Admitting: *Deleted

## 2017-03-07 ENCOUNTER — Other Ambulatory Visit: Payer: Self-pay | Admitting: Hematology and Oncology

## 2017-03-07 ENCOUNTER — Inpatient Hospital Stay
Admission: AD | Admit: 2017-03-07 | Discharge: 2017-03-11 | DRG: 314 | Disposition: A | Discharge status: Home Health | Payer: Medicare Other | Source: Ambulatory Visit | Attending: Internal Medicine | Admitting: Internal Medicine

## 2017-03-07 ENCOUNTER — Inpatient Hospital Stay (HOSPITAL_BASED_OUTPATIENT_CLINIC_OR_DEPARTMENT_OTHER): Payer: Medicare Other | Admitting: Hematology and Oncology

## 2017-03-07 VITALS — BP 89/59 | HR 74 | Temp 96.2°F | Resp 18 | Wt 135.2 lb

## 2017-03-07 VITALS — BP 92/56 | HR 70 | Resp 18

## 2017-03-07 DIAGNOSIS — I480 Paroxysmal atrial fibrillation: Secondary | ICD-10-CM | POA: Diagnosis present

## 2017-03-07 DIAGNOSIS — R0902 Hypoxemia: Secondary | ICD-10-CM | POA: Diagnosis present

## 2017-03-07 DIAGNOSIS — B9689 Other specified bacterial agents as the cause of diseases classified elsewhere: Secondary | ICD-10-CM | POA: Diagnosis present

## 2017-03-07 DIAGNOSIS — R531 Weakness: Secondary | ICD-10-CM

## 2017-03-07 DIAGNOSIS — D638 Anemia in other chronic diseases classified elsewhere: Secondary | ICD-10-CM | POA: Diagnosis present

## 2017-03-07 DIAGNOSIS — Z807 Family history of other malignant neoplasms of lymphoid, hematopoietic and related tissues: Secondary | ICD-10-CM

## 2017-03-07 DIAGNOSIS — C7800 Secondary malignant neoplasm of unspecified lung: Secondary | ICD-10-CM | POA: Diagnosis present

## 2017-03-07 DIAGNOSIS — Z87442 Personal history of urinary calculi: Secondary | ICD-10-CM

## 2017-03-07 DIAGNOSIS — Z953 Presence of xenogenic heart valve: Secondary | ICD-10-CM

## 2017-03-07 DIAGNOSIS — Z936 Other artificial openings of urinary tract status: Secondary | ICD-10-CM | POA: Diagnosis not present

## 2017-03-07 DIAGNOSIS — N400 Enlarged prostate without lower urinary tract symptoms: Secondary | ICD-10-CM | POA: Diagnosis present

## 2017-03-07 DIAGNOSIS — C674 Malignant neoplasm of posterior wall of bladder: Secondary | ICD-10-CM

## 2017-03-07 DIAGNOSIS — Z87891 Personal history of nicotine dependence: Secondary | ICD-10-CM

## 2017-03-07 DIAGNOSIS — I129 Hypertensive chronic kidney disease with stage 1 through stage 4 chronic kidney disease, or unspecified chronic kidney disease: Secondary | ICD-10-CM | POA: Diagnosis present

## 2017-03-07 DIAGNOSIS — E860 Dehydration: Secondary | ICD-10-CM

## 2017-03-07 DIAGNOSIS — Z950 Presence of cardiac pacemaker: Secondary | ICD-10-CM

## 2017-03-07 DIAGNOSIS — I2510 Atherosclerotic heart disease of native coronary artery without angina pectoris: Secondary | ICD-10-CM | POA: Diagnosis present

## 2017-03-07 DIAGNOSIS — D519 Vitamin B12 deficiency anemia, unspecified: Secondary | ICD-10-CM | POA: Diagnosis present

## 2017-03-07 DIAGNOSIS — I959 Hypotension, unspecified: Secondary | ICD-10-CM

## 2017-03-07 DIAGNOSIS — D631 Anemia in chronic kidney disease: Secondary | ICD-10-CM

## 2017-03-07 DIAGNOSIS — N179 Acute kidney failure, unspecified: Secondary | ICD-10-CM | POA: Diagnosis present

## 2017-03-07 DIAGNOSIS — B952 Enterococcus as the cause of diseases classified elsewhere: Secondary | ICD-10-CM | POA: Diagnosis present

## 2017-03-07 DIAGNOSIS — Z808 Family history of malignant neoplasm of other organs or systems: Secondary | ICD-10-CM

## 2017-03-07 DIAGNOSIS — C679 Malignant neoplasm of bladder, unspecified: Secondary | ICD-10-CM | POA: Diagnosis present

## 2017-03-07 DIAGNOSIS — E43 Unspecified severe protein-calorie malnutrition: Secondary | ICD-10-CM | POA: Diagnosis present

## 2017-03-07 DIAGNOSIS — Z8679 Personal history of other diseases of the circulatory system: Secondary | ICD-10-CM

## 2017-03-07 DIAGNOSIS — N390 Urinary tract infection, site not specified: Secondary | ICD-10-CM | POA: Diagnosis present

## 2017-03-07 DIAGNOSIS — D500 Iron deficiency anemia secondary to blood loss (chronic): Secondary | ICD-10-CM

## 2017-03-07 DIAGNOSIS — Z923 Personal history of irradiation: Secondary | ICD-10-CM | POA: Diagnosis not present

## 2017-03-07 DIAGNOSIS — Z79899 Other long term (current) drug therapy: Secondary | ICD-10-CM

## 2017-03-07 DIAGNOSIS — Z951 Presence of aortocoronary bypass graft: Secondary | ICD-10-CM | POA: Diagnosis not present

## 2017-03-07 DIAGNOSIS — N183 Chronic kidney disease, stage 3 (moderate): Secondary | ICD-10-CM | POA: Diagnosis present

## 2017-03-07 DIAGNOSIS — Z66 Do not resuscitate: Secondary | ICD-10-CM | POA: Diagnosis present

## 2017-03-07 DIAGNOSIS — C779 Secondary and unspecified malignant neoplasm of lymph node, unspecified: Secondary | ICD-10-CM

## 2017-03-07 DIAGNOSIS — R634 Abnormal weight loss: Secondary | ICD-10-CM

## 2017-03-07 DIAGNOSIS — I340 Nonrheumatic mitral (valve) insufficiency: Secondary | ICD-10-CM | POA: Diagnosis present

## 2017-03-07 DIAGNOSIS — I951 Orthostatic hypotension: Secondary | ICD-10-CM

## 2017-03-07 DIAGNOSIS — E785 Hyperlipidemia, unspecified: Secondary | ICD-10-CM | POA: Diagnosis present

## 2017-03-07 DIAGNOSIS — Z887 Allergy status to serum and vaccine status: Secondary | ICD-10-CM

## 2017-03-07 DIAGNOSIS — Z6820 Body mass index (BMI) 20.0-20.9, adult: Secondary | ICD-10-CM

## 2017-03-07 DIAGNOSIS — Z880 Allergy status to penicillin: Secondary | ICD-10-CM

## 2017-03-07 LAB — COMPREHENSIVE METABOLIC PANEL
ALT: 15 U/L — ABNORMAL LOW (ref 17–63)
AST: 23 U/L (ref 15–41)
Albumin: 2.5 g/dL — ABNORMAL LOW (ref 3.5–5.0)
Alkaline Phosphatase: 155 U/L — ABNORMAL HIGH (ref 38–126)
Anion gap: 11 (ref 5–15)
BUN: 28 mg/dL — ABNORMAL HIGH (ref 6–20)
CHLORIDE: 98 mmol/L — AB (ref 101–111)
CO2: 22 mmol/L (ref 22–32)
Calcium: 8.7 mg/dL — ABNORMAL LOW (ref 8.9–10.3)
Creatinine, Ser: 1.62 mg/dL — ABNORMAL HIGH (ref 0.61–1.24)
GFR, EST AFRICAN AMERICAN: 42 mL/min — AB (ref >60)
GFR, EST NON AFRICAN AMERICAN: 36 mL/min — AB (ref >60)
Glucose, Bld: 127 mg/dL — ABNORMAL HIGH (ref 65–99)
Potassium: 4.2 mmol/L (ref 3.5–5.1)
SODIUM: 131 mmol/L — AB (ref 135–145)
Total Bilirubin: 0.6 mg/dL (ref 0.3–1.2)
Total Protein: 7.7 g/dL (ref 6.5–8.1)

## 2017-03-07 LAB — CBC WITH DIFFERENTIAL/PLATELET
BASOS ABS: 0 10*3/uL (ref 0–0.1)
Basophils Relative: 0 %
EOS ABS: 0 10*3/uL (ref 0–0.7)
Eosinophils Relative: 0 %
HCT: 26.7 % — ABNORMAL LOW (ref 40.0–52.0)
Hemoglobin: 8.6 g/dL — ABNORMAL LOW (ref 13.0–18.0)
LYMPHS PCT: 6 %
Lymphs Abs: 1.1 10*3/uL (ref 1.0–3.6)
MCH: 25.2 pg — AB (ref 26.0–34.0)
MCHC: 32.4 g/dL (ref 32.0–36.0)
MCV: 78 fL — AB (ref 80.0–100.0)
MONO ABS: 1.2 10*3/uL — AB (ref 0.2–1.0)
Monocytes Relative: 6 %
Neutro Abs: 16.1 10*3/uL — ABNORMAL HIGH (ref 1.4–6.5)
Neutrophils Relative %: 88 %
PLATELETS: 549 10*3/uL — AB (ref 150–440)
RBC: 3.42 MIL/uL — ABNORMAL LOW (ref 4.40–5.90)
RDW: 17.4 % — AB (ref 11.5–14.5)
WBC: 18.4 10*3/uL — ABNORMAL HIGH (ref 3.8–10.6)

## 2017-03-07 LAB — URINALYSIS, COMPLETE (UACMP) WITH MICROSCOPIC
BILIRUBIN URINE: NEGATIVE
Bilirubin Urine: NEGATIVE
GLUCOSE, UA: NEGATIVE mg/dL
Glucose, UA: NEGATIVE mg/dL
Ketones, ur: NEGATIVE mg/dL
Ketones, ur: NEGATIVE mg/dL
NITRITE: NEGATIVE
Nitrite: NEGATIVE
PH: 8 (ref 5.0–8.0)
PROTEIN: 100 mg/dL — AB
PROTEIN: 100 mg/dL — AB
SPECIFIC GRAVITY, URINE: 1.019 (ref 1.005–1.030)
SQUAMOUS EPITHELIAL / LPF: NONE SEEN
Specific Gravity, Urine: 1.019 (ref 1.005–1.030)
Squamous Epithelial / LPF: NONE SEEN
pH: 7 (ref 5.0–8.0)

## 2017-03-07 LAB — TSH: TSH: 2.282 u[IU]/mL (ref 0.350–4.500)

## 2017-03-07 MED ORDER — SOTALOL HCL 80 MG PO TABS
80.0000 mg | ORAL_TABLET | Freq: Every day | ORAL | Status: DC
  Administered 2017-03-07 – 2017-03-10 (×4): 80 mg via ORAL
  Filled 2017-03-07 (×4): qty 1

## 2017-03-07 MED ORDER — ENOXAPARIN SODIUM 30 MG/0.3ML ~~LOC~~ SOLN
30.0000 mg | SUBCUTANEOUS | Status: DC
Start: 2017-03-07 — End: 2017-03-08
  Administered 2017-03-07: 30 mg via SUBCUTANEOUS
  Filled 2017-03-07: qty 0.3

## 2017-03-07 MED ORDER — SODIUM CHLORIDE 0.9 % IV SOLN
Freq: Once | INTRAVENOUS | Status: AC
  Administered 2017-03-07: 12:00:00 via INTRAVENOUS
  Filled 2017-03-07: qty 1000

## 2017-03-07 MED ORDER — MEGESTROL ACETATE 625 MG/5ML PO SUSP: 625.0000 mg | Freq: Every day | ORAL | 0 refills | Status: DC

## 2017-03-07 MED ORDER — CYANOCOBALAMIN 1000 MCG/ML IJ SOLN
1000.0000 ug | Freq: Once | INTRAMUSCULAR | Status: AC
  Administered 2017-03-07: 1000 ug via INTRAMUSCULAR
  Filled 2017-03-07: qty 1

## 2017-03-07 MED ORDER — ACETAMINOPHEN 325 MG PO TABS
650.0000 mg | ORAL_TABLET | Freq: Four times a day (QID) | ORAL | Status: DC | PRN
  Administered 2017-03-10: 650 mg via ORAL

## 2017-03-07 MED ORDER — VITAMIN D 1000 UNITS PO TABS
1000.0000 [IU] | ORAL_TABLET | Freq: Every day | ORAL | Status: DC
  Administered 2017-03-08 – 2017-03-11 (×4): 1000 [IU] via ORAL
  Filled 2017-03-07 (×4): qty 1

## 2017-03-07 MED ORDER — ACETAMINOPHEN 650 MG RE SUPP: 650.0000 mg | Freq: Four times a day (QID) | RECTAL | Status: DC | PRN

## 2017-03-07 MED ORDER — CIPROFLOXACIN IN D5W 200 MG/100ML IV SOLN
200.0000 mg | Freq: Two times a day (BID) | INTRAVENOUS | Status: DC
  Administered 2017-03-07 – 2017-03-10 (×6): 200 mg via INTRAVENOUS
  Filled 2017-03-07 (×7): qty 100

## 2017-03-07 MED ORDER — SODIUM CHLORIDE 0.9 % IV SOLN
INTRAVENOUS | Status: DC
  Administered 2017-03-07 – 2017-03-09 (×3): via INTRAVENOUS

## 2017-03-07 MED ORDER — LIDOCAINE-PRILOCAINE 2.5-2.5 % EX CREA
1.0000 "application " | TOPICAL_CREAM | CUTANEOUS | Status: DC | PRN
  Filled 2017-03-07: qty 5

## 2017-03-07 MED ORDER — MEGESTROL ACETATE 40 MG/ML PO SUSP
200.0000 mg | Freq: Every day | ORAL | Status: DC
  Administered 2017-03-08 – 2017-03-11 (×4): 200 mg via ORAL
  Filled 2017-03-07 (×4): qty 5

## 2017-03-07 MED ORDER — AZELASTINE HCL 0.1 % NA SOLN
2.0000 | Freq: Two times a day (BID) | NASAL | Status: DC
  Administered 2017-03-07 – 2017-03-11 (×8): 2 via NASAL
  Filled 2017-03-07: qty 30

## 2017-03-07 MED ORDER — CYANOCOBALAMIN 1000 MCG/ML IJ SOLN
1000.0000 ug | INTRAMUSCULAR | Status: DC
  Administered 2017-03-08: 1000 ug via INTRAMUSCULAR
  Filled 2017-03-07: qty 1

## 2017-03-07 MED ORDER — SODIUM CHLORIDE 0.9 % IV SOLN
INTRAVENOUS | Status: DC
  Filled 2017-03-07 (×2): qty 1000

## 2017-03-07 MED ORDER — SODIUM CHLORIDE 0.9 % IV SOLN
510.0000 mg | Freq: Once | INTRAVENOUS | Status: AC
  Administered 2017-03-07: 510 mg via INTRAVENOUS
  Filled 2017-03-07: qty 17

## 2017-03-07 MED ORDER — SODIUM CHLORIDE 0.9% FLUSH
10.0000 mL | INTRAVENOUS | Status: DC | PRN
  Administered 2017-03-07: 10 mL via INTRAVENOUS
  Filled 2017-03-07: qty 10

## 2017-03-07 MED ORDER — MEGESTROL ACETATE 40 MG/ML PO SUSP: 200.0000 mg | Freq: Every day | ORAL | 0 refills | Status: AC

## 2017-03-07 MED ORDER — HEPARIN SOD (PORK) LOCK FLUSH 100 UNIT/ML IV SOLN
500.0000 [IU] | Freq: Once | INTRAVENOUS | Status: AC
  Administered 2017-03-07: 500 [IU] via INTRAVENOUS
  Filled 2017-03-07: qty 5

## 2017-03-07 MED ORDER — SODIUM CHLORIDE 0.9 % IV SOLN
Freq: Once | INTRAVENOUS | Status: AC
  Administered 2017-03-07: 15:00:00 via INTRAVENOUS
  Filled 2017-03-07: qty 1000

## 2017-03-07 NOTE — Progress Notes (Signed)
Anticoagulation monitoring(Lovenox):  83 yo male ordered Lovenox 40 mg Q24h  Filed Weights   03/07/17 1725 03/07/17 1841  Weight: 140 lb 9.6 oz (63.8 kg) 140 lb (63.5 kg)   BMI    Lab Results  Component Value Date   CREATININE 1.62 (H) 03/07/2017   CREATININE 1.52 (H) 02/14/2017   CREATININE 1.62 (H) 02/12/2017   Estimated Creatinine Clearance: 28.3 mL/min (A) (by C-G formula based on SCr of 1.62 mg/dL (H)). Hemoglobin & Hematocrit     Component Value Date/Time   HGB 8.6 (L) 03/07/2017 0905   HGB 12.2 (L) 04/05/2015 1041   HCT 26.7 (L) 03/07/2017 0905   HCT 37.4 (L) 04/05/2015 1041     Per Protocol for Patient with estCrcl < 30 ml/min and BMI < 40, will transition to Lovenox 30 mg Q24h.

## 2017-03-07 NOTE — Progress Notes (Signed)
12:30 - Spoke with Dr. Mike Gip, reported Vitals Signs (see flowsheet), continue Normal Saline for thirty more minutes at same rate.  LJ

## 2017-03-07 NOTE — Progress Notes (Signed)
Patient states he has dry mouth.  Unable to eat anything solid.  No appetite.  States when he tries to eat he gets nauseated and vomits it up. Taste is altered.  States he is very fatigued.  Today is the first day he has dressed.    BP decreased today.Sitting 89/59  Hr 74  Standing 76/50 HR 80.

## 2017-03-07 NOTE — Telephone Encounter (Signed)
Called report to [facility] and verbally spoke to her when I took pt upstairs about his need for observation.

## 2017-03-07 NOTE — Progress Notes (Signed)
Hematology/Oncology Consult note [facility]  Telephone:([phone] [phone]  Patient Care Team: Kirk Ruths, MD as PCP - General (Internal Medicine)   Name of the patient: Austin Contreras  [id number]  1926/04/27   Date of visit: 03/07/2017  Diagnosis- metastatic bladder carcinoma  Chief complaint/ Reason for visit- on treatment assessment prior to cycle #2 Keytruda  Heme/Onc history: Patient is a 83 year old male with multiple comorbidities including coronary artery disease, COPD, BPH, A. fib and history of bladder cancer. He had initially presented with one-year history of hematuria starting in 2014 and underwent CT scan of the abdomen which shows abnormalities in the bladder wall. He had cystoscopy done which showed papillary bladder tumor involving the posterior wall as well as carcinoma in situ. 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 There was evidence of lymphovascular invasion as well as at least subepithelial connective tissue involvement. He also underwent radiation therapy but refused chemotherapy at that time. Currently he has bilateral nephrostomy tubes in place and follows up with urology regularly. Last CT scan from September 2017 showed small stable pulmonary nodules. Bladder wall thickening and calcification which could be treatment related versus residual/recurrent disease. Enlarged external iliac node which was new and suspicious for early nodal metastases. He has a repeat CT abdomen scheduled in March 2018.  Patient was last seen by me on 12/06/2016 and at that point did not want to pursue his bladder cancer further as he was not interested in getting any chemotherapy. I also discussed the possibility of immunotherapy but he did not desire any further workup at that time  Patient presented to the ER with some symptoms of pleuritic chest  pain and underwent CT chest which showed bilateral lung lesions consistent with metastatic disease.  CT abdomen from 02/12/2017 showed:Interval increase in size of right external iliac lymph nodes,the largest approximately 10 mm short axis since prior exam suspicious for nodal metastasis.  He also sees urology for bilateral nephrostomy tubes that were placed given scarring of his urinary bladder in the past and his kidney functions have actually improved.   Patient was seen by me on 02/04/2017 and plan was to proceed with palliative immunotherapy with [employer] as patient was not keen on pursuing chemotherapy  H&H from 02/04/2017 was 8.9/27.5 and anemia workup was consistent with anemia of chronic kidney disease as well as a component of iron deficiency and possible underlying malignancy.  Patient decided to proceed with trial of immunotherapy as 1st line option for his bladder cancer   Interval history- Patient has had a decline in health over the past 3 weeks.  He has had very little to eat and drink.  He has no appetite.  He has lost 15 pounds in the past 3 weeks (7 pounds per charting).  He has had nausea and vomiting.  He has had no hematuria.  He stays in his chair at home.  He needs assistance with his ADLS.  He is unable to dress and use the shower without assistance.  ECOG PS- 3 Pain scale- 0 Opioid associated constipation- no  Review of systems- Review of Systems  Constitutional: Positive for malaise/fatigue. Negative for chills, fever and weight loss.  HENT: Positive for hearing loss. Negative for congestion, ear discharge, nosebleeds and sore throat.   Eyes: Negative for blurred vision.  Respiratory: Negative for cough, hemoptysis, sputum production, shortness of breath and wheezing.  Cardiovascular: Negative for chest pain, palpitations, orthopnea, claudication and leg swelling.  Gastrointestinal: Positive for nausea and vomiting. Negative for abdominal pain, blood in stool,  constipation, diarrhea, heartburn and melena.  Genitourinary: Negative for dysuria, flank pain, frequency, hematuria and urgency.  Musculoskeletal: Negative for back pain, falls, joint pain and myalgias.  Skin: Negative for rash.  Neurological: Positive for dizziness and weakness. Negative for tingling, focal weakness, seizures and headaches.  Endo/Heme/Allergies: Does not bruise/bleed easily.  Psychiatric/Behavioral: Negative for depression and suicidal ideas. The patient does not have insomnia.      Current treatment- keytruda to start today  Allergies  Allergen Reactions  . Penicillins Rash and Other (See Comments)    Has patient had a PCN reaction causing immediate rash, facial/tongue/throat swelling, SOB or lightheadedness with hypotension: No Has patient had a PCN reaction causing severe rash involving mucus membranes or skin necrosis: No Has patient had a PCN reaction that required hospitalization No Has patient had a PCN reaction occurring within the last 10 years: No If all of the above answers are "NO", then may proceed with Cephalosporin use.  Marland Kitchen Flu Virus Vaccine Other (See Comments)    Times 3     Past Medical History:  Diagnosis Date  . B12 deficiency anemia 08/02/2015  . Bladder cancer ([place])    a. stage 2 s/p TUBTR;  b. s/p bilateral nephrostomy tubes.  Marland Kitchen BPH (benign prostatic hyperplasia)   . CKD (chronic kidney disease) stage 3, GFR 30-59 ml/min   . Coronary artery disease    a. 08/2012 s/p CABG x 2 (VG->RCA, VG->OM1) @ [facility];  b. H/o stress test some time after CABG (pt unsure of date) -> reportedly normal.  . GERD (gastroesophageal reflux disease)   . H/O aortic valve replacement with porcine valve    a. 08/2012 35mm Trifecta Biologic Valve.  Marland Kitchen History of GI bleed    a. ~ 2011 - required 6 units PRBC's.  . History of nephrolithiasis   . Hyperlipidemia   . Hypertension   . Mitral regurgitation   . PAF (paroxysmal atrial fibrillation) ([place])    a. Developed  post-op AVR/CABG in 08/2012.  Initially treated with amio but did not tolerate.  On sotalol since late 2013.  No OAC despite CHA2DS2VASc = 4 secondary to h/o GIB and chronic hematuria in the setting of bladder cancer s/p nephrostomy tubes.  . Peripheral neuropathy ([place])   . PONV (postoperative nausea and vomiting)   . Presence of permanent cardiac pacemaker    a. 08/2012 developed heart block/junctional bradycardia following CABG/AVR-->s/p MDT CRM Advisa DR MRI SureScan [id number] Dual Chamber PPM, ser # [id number].  . SVT (supraventricular tachycardia) ([place])      Past Surgical History:  Procedure Laterality Date  . AORTIC VALVE REPLACEMENT    . APPENDECTOMY  1942  . BACK SURGERY  1991  . CATARACT EXTRACTION, BILATERAL    . cataracts    . CORONARY ARTERY BYPASS GRAFT  08/2012  . CYSTOSCOPY WITH FULGERATION Bilateral 12/23/2015   Procedure: CYSTOSCOPY WITH FULGERATION;  Surgeon: Royston Cowper, MD;  Location: [facility] ORS;  Service: Urology;  Laterality: Bilateral;  . HAND SURGERY  1976   after trauma  . IR GENERIC HISTORICAL  09/06/2016   IR NEPHROSTOMY EXCHANGE LEFT 09/06/2016 Greggory Keen, MD [facility]  . IR GENERIC HISTORICAL  09/06/2016   IR NEPHROSTOMY EXCHANGE RIGHT 09/06/2016 Greggory Keen, MD [facility]  . IR GENERIC HISTORICAL  11/05/2016   IR NEPHROSTOMY TUBE CHANGE 11/05/2016 Eulas Post  Kathlene Cote, MD [facility]  . IR GENERIC HISTORICAL  01/09/2017   IR NEPHROSTOMY EXCHANGE LEFT 01/09/2017 Arne Cleveland, MD [facility]  . IR GENERIC HISTORICAL  01/09/2017   IR NEPHROSTOMY EXCHANGE RIGHT 01/09/2017 Arne Cleveland, MD [facility]  . IR GENERIC HISTORICAL  01/16/2016   IR NEPHROSTOMY EXCHANGE RIGHT 01/16/2016 [facility] OUT REF  . IR GENERIC HISTORICAL  02/12/2017   IR FLUORO GUIDE PORT INSERTION RIGHT 02/12/2017 Jacqulynn Cadet, MD [facility]  . IR GENERIC HISTORICAL  02/21/2017   IR NEPHROSTOMY EXCHANGE RIGHT 02/21/2017 Markus Daft, MD [facility]  . IR GENERIC HISTORICAL   02/21/2017   IR NEPHROSTOMY EXCHANGE LEFT 02/21/2017 Markus Daft, MD [facility]  . PACEMAKER INSERTION    . PREPATELLAR BURSA EXCISION    . ROTATOR CUFF REPAIR  1995   right shoulder  . TRANSURETHRAL RESECTION OF BLADDER TUMOR N/A 10/17/2015   Procedure: TRANSURETHRAL RESECTION OF BLADDER TUMOR (TURBT);  Surgeon: Royston Cowper, MD;  Location: [facility] ORS;  Service: Urology;  Laterality: N/A;  . TURP VAPORIZATION  2010    Social History   Social History  . Marital status: Married    Spouse name: N/A  . Number of children: N/A  . Years of education: N/A   Occupational History  . Not on file.   Social History Main Topics  . Smoking status: Former Smoker    Types: Cigarettes  . Smokeless tobacco: Former Systems developer     Comment: 1992  . Alcohol use No  . Drug use: No  . Sexual activity: Not on file   Other Topics Concern  . Not on file   Social History Narrative   Lives at home with wife, very independent. Active at baseline.  Retired from [employer]. 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    Family History  Problem Relation Age of Onset  . Lymphoma Sister   . Bone cancer Mother   . CAD Father   . CVA Father   . Prostate cancer Neg Hx   . Bladder Cancer Neg Hx   . Kidney cancer Neg Hx     No current facility-administered medications for this visit.   Current Outpatient Prescriptions:  .  megestrol (MEGACE) 40 MG/ML suspension, Take 5 mLs (200 mg total) by mouth daily., Disp: 150 mL, Rfl: 0  Facility-Administered Medications Ordered in Other Visits:  .  0.9 %  sodium chloride infusion, , Intravenous, Continuous, Loletha Grayer, MD, Last Rate: 70 mL/hr at 03/07/17 1906 .  acetaminophen (TYLENOL) tablet 650 mg, 650 mg, Oral, Q6H PRN **OR** acetaminophen (TYLENOL) suppository 650 mg, 650 mg, Rectal, Q6H PRN, Loletha Grayer, MD .  azelastine (ASTELIN) 0.1 % nasal spray 2 spray, 2 spray, Each Nare, BID, Loletha Grayer, MD, 2 spray at 03/07/17 2019 .  cholecalciferol (VITAMIN  D) tablet 1,000 Units, 1,000 Units, Oral, Daily, Loletha Grayer, MD .  ciprofloxacin (CIPRO) IVPB 200 mg, 200 mg, Intravenous, Q12H, Loletha Grayer, MD, 200 mg at 03/07/17 2235 .  cyanocobalamin ((VITAMIN B-12)) injection 1,000 mcg, 1,000 mcg, Intramuscular, Q30 days, Loletha Grayer, MD .  enoxaparin (LOVENOX) injection 30 mg, 30 mg, Subcutaneous, Q24H, Loletha Grayer, MD, 30 mg at 03/07/17 2019 .  lidocaine-prilocaine (EMLA) cream 1 application, 1 application, Topical, PRN, Loletha Grayer, MD .  megestrol (MEGACE) 40 MG/ML suspension 200 mg, 200 mg, Oral, Daily, Loletha Grayer, MD .  sotalol (BETAPACE) tablet 80 mg, 80 mg, Oral, Daily, Loletha Grayer, MD, 80 mg at 03/07/17 2019  Physical exam:  Vitals:  03/07/17 1009  BP: (!) 89/59  Pulse: 74  Resp: 18  Temp: (!) 96.2 F (35.7 C)  TempSrc: Tympanic  Weight: 135 lb 4 oz (61.3 kg)   Physical Exam  Constitutional: He is oriented to person, place, and time.  Chronically fatigued appearing, thin elderly gentleman sitiing comfortably in a wheelchair in no acute distress  HENT:  Head: Normocephalic and atraumatic.  Dry mouth  Eyes: EOM are normal. Pupils are equal, round, and reactive to light.  Neck: Normal range of motion.  Cardiovascular: Normal rate, regular rhythm and normal heart sounds.   Pulmonary/Chest: Effort normal and breath sounds normal.  Abdominal: Soft. Bowel sounds are normal.  Neurological: He is alert and oriented to person, place, and time.  Skin: Skin is warm and dry.  External nephrostomy tubes     CMP Latest Ref Rng & Units 03/07/2017  Glucose 65 - 99 mg/dL 127(H)  BUN 6 - 20 mg/dL 28(H)  Creatinine 0.61 - 1.24 mg/dL 1.62(H)  Sodium 135 - 145 mmol/L 131(L)  Potassium 3.5 - 5.1 mmol/L 4.2  Chloride 101 - 111 mmol/L 98(L)  CO2 22 - 32 mmol/L 22  Calcium 8.9 - 10.3 mg/dL 8.7(L)  Total Protein 6.5 - 8.1 g/dL 7.7  Total Bilirubin 0.3 - 1.2 mg/dL 0.6  Alkaline Phos 38 - 126 U/L 155(H)  AST 15 - 41 U/L  23  ALT 17 - 63 U/L 15(L)   CBC Latest Ref Rng & Units 03/07/2017  WBC 3.8 - 10.6 K/uL 18.4(H)  Hemoglobin 13.0 - 18.0 g/dL 8.6(L)  Hematocrit 40.0 - 52.0 % 26.7(L)  Platelets 150 - 440 K/uL 549(H)    No images are attached to the encounter.  Ct Abdomen Pelvis Wo Contrast  Result Date: 02/12/2017 CLINICAL DATA:  Bladder cancer with metastasis to the lungs. Evaluate for metastasis elsewhere. EXAM: CT ABDOMEN AND PELVIS WITHOUT CONTRAST TECHNIQUE: Multidetector CT imaging of the abdomen and pelvis was performed following the standard protocol without IV contrast. COMPARISON:  09/03/2016 CT FINDINGS: Lower chest: Variable sized metastatic pulmonary nodules and masses, the largest visualized is in the right lower lobe posteriorly at 2.6 cm. Borderline cardiomegaly. Right atrial and right ventricular leads are noted. No pericardial effusion or thickening. Hepatobiliary: 11 x 9 mm hypodensity along the falciform ligament in a typical location for partial volume averaging of the falciform ligament or adjacent mild fatty change. Given the limitations of a noncontrast study, no conclusive findings of metastatic disease to the liver. Small nonobstructing gallstones. One calcification measuring 3 mm is along the nondependent wall and could potentially representing a tiny calcified polyp or adherent stone. Nondistended gallbladder. Pancreas: No focal pancreatic mass or ductal dilatation. No peripancreatic inflammation. Spleen: No splenomegaly or mass. Adrenals/Urinary Tract: Normal bilateral adrenal glands. Percutaneous nephrostomy tubes are seen with tips coiled in the renal pelves bilaterally. Nonobstructing left lower pole renal calculi are again identified the largest approximately 8 x 6 mm. Simple appearing 1 cm exophytic cyst off the upper pole of the right kidney medially. Thickened contracted urinary bladder with partially calcified wall is unchanged in appearance. Presumed passage of the distal left  ureteral stone since prior exam. Faint mural calcifications noted of the distal right ureter. Stomach/Bowel: Contrast filled distended stomach without focal mural thickening or intraluminal mass. There is normal small bowel rotation without thickening or obstruction. There is scattered colonic diverticulosis without acute diverticulitis. Moderate colonic stool burden is seen. Circular muscle hypertrophy with diverticulosis again noted of the sigmoid colon. Vascular/Lymphatic: Aortic and  branch vessel atherosclerosis. Interval increase in size of iliac chain lymph nodes the largest is approximately 10 mm short axis, series 2, image 60 versus 7 mm previously. The previously noted right external iliac lymph node measuring 8 mm previously has remained stable although there is a slightly larger adjacent lymph node just medial to this measuring approximately 7 mm versus 4 mm previously. Reproductive: Enlarged prostate gland again noted measuring 7.5 x 6 cm transverse by AP versus 6.4 x 5.2 cm previously. Other: Fat containing left inguinal canal. Musculoskeletal: Degenerative disc disease L4-5 and L5-S1 with mild levoconvex curvature of the lower lumbar spine. Evidence of prior right L4 laminectomy IMPRESSION: 1. Interval increase in size of right external iliac lymph nodes, the largest approximately 10 mm short axis since prior exam suspicious for nodal metastasis. 2. Chronic bladder wall thickening and calcifications. Faint calcifications noted of the distal right ureter may represent residual or local spread of disease. 3. Bilateral percutaneous nephrostomy tubes are in place. Passage of the distal left ureteral stone since prior exam. 4. Innumerable metastatic lesions noted of the visualized lungs. 5. Small hypodensity adjacent to the falciform in a common location for focal fatty infiltration or partial volume averaging of the leaf of the falciform. 6. Left-sided nephrolithiasis. 7. Cholelithiasis. Possible calcified  3 mm polyp or adherent stone also noted. 8. Prostatomegaly slightly increased in size. Electronically Signed   By: Ashley Royalty M.D.   On: 02/12/2017 14:32   Dg Chest Port 1 View  Result Date: 03/07/2017 CLINICAL DATA:  Current history of metastatic lung cancer. Acute onset of dehydration and generalized weakness. Initial encounter. EXAM: PORTABLE CHEST 1 VIEW COMPARISON:  Chest radiograph and CT of the chest performed 01/31/2017 FINDINGS: There has been marked interval progression of diffuse metastatic disease throughout both lungs, with numerous masses of varying size seen bilaterally. No pleural effusion or pneumothorax is seen. No definite superimposed focal airspace consolidation is identified, though evaluation for airspace disease is limited given metastases. The patient's right-sided chest port is noted ending about the mid SVC. The cardiomediastinal silhouette remains normal in size. The patient is status post median sternotomy, with changes of prior CABG. A pacemaker is noted overlying the left chest wall, with leads ending overlying the right atrium and right ventricle. No acute osseous abnormalities are seen. IMPRESSION: Marked interval progression of diffuse metastatic disease throughout the lungs, with numerous masses of varying size seen bilaterally. Electronically Signed   By: Garald Balding M.D.   On: 03/07/2017 20:06   Ir Nephrostomy Exchange Left  Result Date: 02/21/2017 INDICATION: 83 year-old with metastatic bladder cancer. Patient presents for routine nephrostomy tube exchange. EXAM: EXCHANGE BILATERAL NEPHROSTOMY TUBES WITH FLUOROSCOPY Physician: Stephan Minister. Anselm Pancoast, MD COMPARISON:  None. MEDICATIONS: None ANESTHESIA/SEDATION: Fentanyl 25 mcg IV; Versed 1.0 mg IV Moderate Sedation Time:  12 minutes The patient was continuously monitored during the procedure by the interventional radiology nurse under my direct supervision. CONTRAST:  81mL ISOVUE-300 IOPAMIDOL (ISOVUE-300) INJECTION 61% -  administered into the collecting system(s) FLUOROSCOPY TIME:  Fluoroscopy Time: 48 seconds COMPLICATIONS: None immediate. PROCEDURE: The patient was placed prone and both flanks were prepped and draped in a sterile fashion.  Maximal barrier sterile technique was utilized including caps, mask, sterile gowns, sterile gloves, sterile drape, hand hygiene and skin antiseptic.  Contrast was injected through both nephrostomy tubes. The right nephrostomy tube was cut and removed over an Amplatz wire. A new 10.2 [place] multipurpose drain was reconstituted in the renal pelvis. Contrast was injected  to confirm placement in the renal pelvis. The left nephrostomy tube was cut and removed over a stiff Glidewire. New 10.2 [place] multipurpose drain was reconstituted in the left renal pelvis and contrast was injected to confirm placement in the renal pelvis. Skin was anesthetized with 1% lidocaine. Catheters were sutured to skin. Fluoroscopic images were taken and saved for this procedure. IMPRESSION: Successful exchange of bilateral nephrostomy tubes with fluoroscopy. Electronically Signed   By: Markus Daft M.D.   On: 02/21/2017 12:20   Ir Nephrostomy Exchange Right  Result Date: 02/21/2017 INDICATION: 83 year-old with metastatic bladder cancer. Patient presents for routine nephrostomy tube exchange. EXAM: EXCHANGE BILATERAL NEPHROSTOMY TUBES WITH FLUOROSCOPY Physician: Stephan Minister. Anselm Pancoast, MD COMPARISON:  None. MEDICATIONS: None ANESTHESIA/SEDATION: Fentanyl 25 mcg IV; Versed 1.0 mg IV Moderate Sedation Time:  12 minutes The patient was continuously monitored during the procedure by the interventional radiology nurse under my direct supervision. CONTRAST:  15mL ISOVUE-300 IOPAMIDOL (ISOVUE-300) INJECTION 61% - administered into the collecting system(s) FLUOROSCOPY TIME:  Fluoroscopy Time: 48 seconds COMPLICATIONS: None immediate. PROCEDURE: The patient was placed prone and both flanks were prepped and draped in a sterile fashion.   Maximal barrier sterile technique was utilized including caps, mask, sterile gowns, sterile gloves, sterile drape, hand hygiene and skin antiseptic.  Contrast was injected through both nephrostomy tubes. The right nephrostomy tube was cut and removed over an Amplatz wire. A new 10.2 [place] multipurpose drain was reconstituted in the renal pelvis. Contrast was injected to confirm placement in the renal pelvis. The left nephrostomy tube was cut and removed over a stiff Glidewire. New 10.2 [place] multipurpose drain was reconstituted in the left renal pelvis and contrast was injected to confirm placement in the renal pelvis. Skin was anesthetized with 1% lidocaine. Catheters were sutured to skin. Fluoroscopic images were taken and saved for this procedure. IMPRESSION: Successful exchange of bilateral nephrostomy tubes with fluoroscopy. Electronically Signed   By: Markus Daft M.D.   On: 02/21/2017 12:20   Ir Fluoro Guide Port Insertion Right  Result Date: 02/12/2017 INDICATION: 83 year old male with high-grade transitional cell bladder cancer metastatic to the lungs. He requires durable IV access for immunotherapy. EXAM: IMPLANTED PORT A CATH PLACEMENT WITH ULTRASOUND AND FLUOROSCOPIC GUIDANCE MEDICATIONS: Vancomycin 1 gm IV; The antibiotic was administered within an appropriate time interval prior to skin puncture. ANESTHESIA/SEDATION: Versed 2 mg IV; Fentanyl 75 mcg IV; Moderate Sedation Time:  23 minutes The patient was continuously monitored during the procedure by the interventional radiology nurse under my direct supervision. FLUOROSCOPY TIME:  0 minutes, 24 seconds (4 mGy) COMPLICATIONS: None immediate. PROCEDURE: The right neck and chest was prepped with chlorhexidine, and draped in the usual sterile fashion using maximum barrier technique (cap and mask, sterile gown, sterile gloves, large sterile sheet, hand hygiene and cutaneous antiseptic). Antibiotic prophylaxis was provided with 1g vancomycin administered  IV one hour prior to skin incision. Local anesthesia was attained by infiltration with 1% lidocaine with epinephrine. Ultrasound demonstrated patency of the right internal jugular vein, and this was documented with an image. Under real-time ultrasound guidance, this vein was accessed with a 21 gauge micropuncture needle and image documentation was performed. A small dermatotomy was made at the access site with an 11 scalpel. A 0.018" wire was advanced into the SVC and the access needle exchanged for a 80F micropuncture vascular sheath. The 0.018" wire was then removed and a 0.035" wire advanced into the IVC. An appropriate location for the subcutaneous reservoir was selected below  the clavicle and an incision was made through the skin and underlying soft tissues. The subcutaneous tissues were then dissected using a combination of blunt and sharp surgical technique and a pocket was formed. A single lumen power injectable portacatheter was then tunneled through the subcutaneous tissues from the pocket to the dermatotomy and the port reservoir placed within the subcutaneous pocket. The venous access site was then serially dilated and a peel away vascular sheath placed over the wire. The wire was removed and the port catheter advanced into position under fluoroscopic guidance. The catheter tip is positioned in the superior cavoatrial junction. This was documented with a spot image. The portacatheter was then tested and found to flush and aspirate well. The port was flushed with saline followed by 100 units/mL heparinized saline. The pocket was then closed in two layers using first subdermal inverted interrupted absorbable sutures followed by a running subcuticular suture. The epidermis was then sealed with Dermabond. The dermatotomy at the venous access site was also sealed with Dermabond. IMPRESSION: Successful placement of a right IJ approach Power Port with ultrasound and fluoroscopic guidance. The catheter is ready  for use. Signed, Criselda Peaches, MD [facility] Electronically Signed   By: Jacqulynn Cadet M.D.   On: 02/12/2017 15:33     Assessment and plan- Patient is a 83 y.o. male with metastatic bladder carcinoma with lung and LN metastases  1. Patient received cycle #1 Keytruda on 02/14/2017.  He has had a general decline in health over the past 3 weeks.  Oral intake is poor.  He has no appetite and has lost weight.  Etiology may be secondary to his underlying metastatic disease.  No clear side effects from [facility] unless patient had adrenal insufficiency or hypophysitis.  Patient's blood pressure is low today.  Will give IVF as may be simple dehydration.  Patient on antihypertensives (enalapril)- will hold.  If blood pressure does not improve, will check cortisol level and ACTH.  2.  Patient has bilateral nephrostomy tubes.  Renal function slightly down likely secondary to dehydration.  Will give fluids today and recheck orthostatics.  Patient's daughter requesting steroids for appetite stimulation.  As patient on Keytruda, avoid steroids unless patient has a toxicity to [place].  Discuss Megace.  Side effects reviewed including hyperglycemia, hypertension and thrombosis.  Patient would like to try.  Discuss Boost and other supplements.  3. B12 deficiency- patient has been getting monthly B12 shots with his PCP but since he is going to be having regular visits with we will be giving him those shots here.  He received B12 on 02/14/2017.  Will continue monthly.  4. Normocytic anemia- likely multifactorial secondary to anemia of chronic kidney disease as well as underlying iron deficiency and malignancy. Dr. Janese Banks suggested that patient start taking oral iron 325 mg by mouth twice a day at last visit.  Patient would like to proceed with Feraheme today.  Once iron stores adequate, would proceed with Aranesp 0.45 mcg/kg.  5.  Code status:  Discuss  with patient.  Patient does not wish heroic measures.  Code status is DNR/DNI.    Visit Diagnosis 1. Malignant neoplasm of posterior wall of urinary bladder (HCC)   2. Hypotension, unspecified hypotension type   3. Dehydration   4. Weight loss   5. Iron deficiency anemia due to chronic blood loss   6. Anemia due to vitamin B12 deficiency, unspecified B12 deficiency type     Addendum:  Patient  did not improve with IVF today.  Decision made to hold Keytruda today.  Decision made to admit patient to hospital on observation status for continued fluids, AM labs (serum cortisol and ACTH), and nutrition consult.   Lequita Asal, MD  03/07/2017, 11:16 AM

## 2017-03-07 NOTE — H&P (Signed)
[facility] [place] NAME: Austin Contreras    MR#:  [id number]  DATE OF BIRTH:  December 20, 1925  DATE OF ADMISSION:  03/07/2017  PRIMARY CARE PHYSICIAN: [name], MD   REQUESTING/REFERRING PHYSICIAN: Dr Nolon Stalls  CHIEF COMPLAINT:  Sent in for hypotension  HISTORY OF PRESENT ILLNESS:  Austin Contreras  is a 83 y.o. male with a known history of Bladder cancer that was treated in the past and now has lung nodules and received his first chemotherapy 3 weeks ago. He went today for his second chemotherapy but his blood pressure was low and he received fluids all day long at the cancer center. As per the patient and family has not been eating and drinking very well he's lost extreme pounds in the last 3 weeks. The patient states that the food does not taste good and he just can't force himself to eat. He gets nauseous and vomits if he forces himself.  PAST MEDICAL HISTORY:   Past Medical History:  Diagnosis Date  . B12 deficiency anemia 08/02/2015  . Bladder cancer ([place])    a. stage 2 s/p TUBTR;  b. s/p bilateral nephrostomy tubes.  Marland Kitchen BPH (benign prostatic hyperplasia)   . CKD (chronic kidney disease) stage 3, GFR 30-59 ml/min   . Coronary artery disease    a. 08/2012 s/p CABG x 2 (VG->RCA, VG->OM1) @ [facility];  b. H/o stress test some time after CABG (pt unsure of date) -> reportedly normal.  . GERD (gastroesophageal reflux disease)   . H/O aortic valve replacement with porcine valve    a. 08/2012 38mm Trifecta Biologic Valve.  Marland Kitchen History of GI bleed    a. ~ 2011 - required 6 units PRBC's.  . History of nephrolithiasis   . Hyperlipidemia   . Hypertension   . Mitral regurgitation   . PAF (paroxysmal atrial fibrillation) ([place])    a. Developed post-op AVR/CABG in 08/2012.  Initially treated with amio but did not tolerate.  On sotalol since late 2013.  No OAC despite CHA2DS2VASc = 4 secondary to h/o GIB and chronic hematuria in the setting  of bladder cancer s/p nephrostomy tubes.  . Peripheral neuropathy ([place])   . PONV (postoperative nausea and vomiting)   . Presence of permanent cardiac pacemaker    a. 08/2012 developed heart block/junctional bradycardia following CABG/AVR-->s/p MDT CRM Advisa DR MRI SureScan [id number] Dual Chamber PPM, ser # [id number] H.  . SVT (supraventricular tachycardia) ([place])     PAST SURGICAL HISTORY:   Past Surgical History:  Procedure Laterality Date  . AORTIC VALVE REPLACEMENT    . APPENDECTOMY  1942  . BACK SURGERY  1991  . CATARACT EXTRACTION, BILATERAL    . cataracts    . CORONARY ARTERY BYPASS GRAFT  08/2012  . CYSTOSCOPY WITH FULGERATION Bilateral 12/23/2015   Procedure: CYSTOSCOPY WITH FULGERATION;  Surgeon: Royston Cowper, MD;  Location: [facility] ORS;  Service: Urology;  Laterality: Bilateral;  . HAND SURGERY  1976   after trauma  . IR GENERIC HISTORICAL  09/06/2016   IR NEPHROSTOMY EXCHANGE LEFT 09/06/2016 Greggory Keen, MD [facility]  . IR GENERIC HISTORICAL  09/06/2016   IR NEPHROSTOMY EXCHANGE RIGHT 09/06/2016 Greggory Keen, MD [facility]  . IR GENERIC HISTORICAL  11/05/2016   IR NEPHROSTOMY TUBE CHANGE 11/05/2016 Aletta Edouard, MD [facility]  . IR GENERIC HISTORICAL  01/09/2017   IR NEPHROSTOMY EXCHANGE LEFT 01/09/2017 Arne Cleveland, MD [facility]  . IR GENERIC HISTORICAL  01/09/2017   IR NEPHROSTOMY EXCHANGE RIGHT 01/09/2017 Arne Cleveland, MD [facility]  . IR GENERIC HISTORICAL  01/16/2016   IR NEPHROSTOMY EXCHANGE RIGHT 01/16/2016 [facility] OUT REF  . IR GENERIC HISTORICAL  02/12/2017   IR FLUORO GUIDE PORT INSERTION RIGHT 02/12/2017 Jacqulynn Cadet, MD [facility]  . IR GENERIC HISTORICAL  02/21/2017   IR NEPHROSTOMY EXCHANGE RIGHT 02/21/2017 Markus Daft, MD [facility]  . IR GENERIC HISTORICAL  02/21/2017   IR NEPHROSTOMY EXCHANGE LEFT 02/21/2017 Markus Daft, MD [facility]  . PACEMAKER INSERTION    . PREPATELLAR BURSA EXCISION    . ROTATOR CUFF REPAIR  1995    right shoulder  . TRANSURETHRAL RESECTION OF BLADDER TUMOR N/A 10/17/2015   Procedure: TRANSURETHRAL RESECTION OF BLADDER TUMOR (TURBT);  Surgeon: Royston Cowper, MD;  Location: [facility];  Service: Urology;  Laterality: N/A;  . TURP VAPORIZATION  2010    SOCIAL HISTORY:   Social History  Substance Use Topics  . Smoking status: Former Smoker    Types: Cigarettes  . Smokeless tobacco: Former Systems developer     Comment: 1992  . Alcohol use No    FAMILY HISTORY:   Family History  Problem Relation Age of Onset  . Lymphoma Sister   . Bone cancer Mother   . CAD Father   . CVA Father   . Prostate cancer Neg Hx   . Bladder Cancer Neg Hx   . Kidney cancer Neg Hx     DRUG ALLERGIES:   Allergies  Allergen Reactions  . Penicillins Rash and Other (See Comments)    Has patient had a PCN reaction causing immediate rash, facial/tongue/throat swelling, SOB or lightheadedness with hypotension: No Has patient had a PCN reaction causing severe rash involving mucus membranes or skin necrosis: No Has patient had a PCN reaction that required hospitalization No Has patient had a PCN reaction occurring within the last 10 years: No If all of the above answers are "NO", then may proceed with Cephalosporin use.  Marland Kitchen Flu Virus Vaccine Other (See Comments)    Times 3    REVIEW OF SYSTEMS:  CONSTITUTIONAL: No fever, Feels cold all the time. Positive for fatigue and weakness.  EYES: No blurred or double vision.  EARS, NOSE, AND THROAT: No tinnitus or ear pain. No sore throat. Decreased hearing. Positive for postnasal drip. Positive for dry mouth. RESPIRATORY: No cough, some shortness of breath, no wheezing or hemoptysis.  CARDIOVASCULAR: No chest pain, orthopnea, edema.  GASTROINTESTINAL: Some nausea and vomiting. No diarrhea or abdominal pain. No blood in bowel movements GENITOURINARY: Still leaks urine through the penis. Most of his urine is through the nephrostomy tubes ENDOCRINE: No polyuria, nocturia,   HEMATOLOGY: No anemia, easy bruising or bleeding SKIN: No rash or lesion. MUSCULOSKELETAL: No joint pain or arthritis.   NEUROLOGIC: No tingling, numbness, weakness.  PSYCHIATRY: No anxiety or depression.   MEDICATIONS AT HOME:   Prior to Admission medications   Medication Sig Start Date End Date Taking? Authorizing Provider  acetaminophen (TYLENOL) 500 MG tablet Take 500 mg by mouth every 6 (six) hours as needed.    Historical Provider, MD  azelastine (ASTELIN) 0.1 % nasal spray Place 2 sprays into both nostrils 2 (two) times daily. Use in each nostril as directed    Historical Provider, MD  cholecalciferol (VITAMIN D) 1000 units tablet Take 1,000 Units by mouth daily.    Historical Provider, MD  clindamycin (CLEOCIN) 150 MG capsule Take 150 mg by mouth 3 (three)  times daily as needed.  11/27/16   Historical Provider, MD  cyanocobalamin (,VITAMIN B-12,) 1000 MCG/ML injection Inject 1,000 mcg into the muscle every 30 (thirty) days.    Historical Provider, MD  enalapril (VASOTEC) 5 MG tablet Take 5 mg by mouth daily.     Historical Provider, MD  furosemide (LASIX) 20 MG tablet Take 20 mg by mouth daily as needed.     Historical Provider, MD  lidocaine-prilocaine (EMLA) cream Apply 1 application topically as needed. Apply to port 1-2 hours (cover with wrap) before chemotherapy appt 02/14/17   Sindy Guadeloupe, MD  megestrol (MEGACE) 40 MG/ML suspension Take 5 mLs (200 mg total) by mouth daily. 03/07/17   Sindy Guadeloupe, MD  simvastatin (ZOCOR) 10 MG tablet Take 10 mg by mouth at bedtime.     Historical Provider, MD  Sodium Chloride Flush (NORMAL SALINE FLUSH) 0.9 % SOLN use 10 milliliter SALINE FLUSHES AS DIRECTED 01/26/17   Larene Beach A McGowan, PA-C  sotalol (BETAPACE) 80 MG tablet Take 1 tablet (80 mg total) by mouth daily. 02/11/17   Wellington Hampshire, MD      VITAL SIGNS:  Blood pressure 123/80, pulse 70, temperature 97.7 F (36.5 C), temperature source Oral, height 5\' 10"  (1.778 m), weight 63.5  kg (140 lb), SpO2 99 %.  PHYSICAL EXAMINATION:  GENERAL:  83 y.o.-year-old patient lying in the bed with no acute distress.  EYES: Pupils equal, round, reactive to light and accommodation. No scleral icterus. Extraocular muscles intact.  HEENT: Head atraumatic, normocephalic. Oropharynx and nasopharynx clear.  NECK:  Supple, no jugular venous distention. No thyroid enlargement, no tenderness.  LUNGS: Normal breath sounds bilaterally, no wheezing, rales,rhonchi or crepitation. No use of accessory muscles of respiration.  CARDIOVASCULAR: S1, S2 normal. No murmurs, rubs, or gallops.  ABDOMEN: Soft, nontender, nondistended. Bowel sounds present. No organomegaly or mass.  EXTREMITIES: No pedal edema, cyanosis, or clubbing.  NEUROLOGIC: Cranial nerves II through XII are intact. Muscle strength 5/5 in all extremities. Sensation intact. Gait not checked.  PSYCHIATRIC: The patient is alert and oriented x 3.  SKIN: No rash, lesion, or ulcer.   LABORATORY PANEL:   CBC  Recent Labs Lab 03/07/17 0905  WBC 18.4*  HGB 8.6*  HCT 26.7*  PLT 549*   ------------------------------------------------------------------------------------------------------------------  Chemistries   Recent Labs Lab 03/07/17 0905  NA 131*  K 4.2  CL 98*  CO2 22  GLUCOSE 127*  BUN 28*  CREATININE 1.62*  CALCIUM 8.7*  AST 23  ALT 15*  ALKPHOS 155*  BILITOT 0.6   ------------------------------------------------------------------------------------------------------------------  ----------------------------------------------------------------    IMPRESSION AND PLAN:   1. Hypotension with poor appetite and dehydration. Give IV fluid hydration. Hold enalapril. 2. Metastatic bladder carcinoma with lung and lymph node metastases. Patient is a DO NOT RESUSCITATE. I advised the patient discuss further treatment options with oncology. 3. History of atrial fibrillation. On sotalol 4. Hyperlipidemia unspecified  stopped simvastatin if this is contributing to his weakness. 5. Chronic kidney disease stage III. Monitor with hydration 6. Leukocytosis. Unclear etiology. Obtain blood cultures 2. Urinalysis and cultures from each of the nephrostomy tubes and a chest x-ray. Try to hold off on antibiotics for right now. 7. Anemia likely of chronic disease with history of cancer  All the records are reviewed and case discussed with ED provider. Management plans discussed with the patient, family and they are in agreement.  CODE STATUS: DO NOT RESUSCITATE  TOTAL TIME TAKING CARE OF THIS PATIENT: 50 minutes.  Loletha Grayer M.D on 03/07/2017 at 6:45 PM  Between 7am to 6pm - Pager - [phone]  After 6pm call admission pager [phone]  [facility] Office  [phone]  CC: Primary care physician; [name], MD

## 2017-03-08 ENCOUNTER — Encounter: Payer: Self-pay | Admitting: Hematology and Oncology

## 2017-03-08 DIAGNOSIS — D500 Iron deficiency anemia secondary to blood loss (chronic): Secondary | ICD-10-CM | POA: Insufficient documentation

## 2017-03-08 LAB — CBC
HCT: 23.9 % — ABNORMAL LOW (ref 40.0–52.0)
Hemoglobin: 7.5 g/dL — ABNORMAL LOW (ref 13.0–18.0)
MCH: 24.7 pg — AB (ref 26.0–34.0)
MCHC: 31.4 g/dL — AB (ref 32.0–36.0)
MCV: 78.9 fL — ABNORMAL LOW (ref 80.0–100.0)
PLATELETS: 476 10*3/uL — AB (ref 150–440)
RBC: 3.03 MIL/uL — ABNORMAL LOW (ref 4.40–5.90)
RDW: 17.8 % — AB (ref 11.5–14.5)
WBC: 15.2 10*3/uL — ABNORMAL HIGH (ref 3.8–10.6)

## 2017-03-08 LAB — BASIC METABOLIC PANEL
ANION GAP: 7 (ref 5–15)
BUN: 25 mg/dL — AB (ref 6–20)
CALCIUM: 7.9 mg/dL — AB (ref 8.9–10.3)
CO2: 24 mmol/L (ref 22–32)
CREATININE: 1.35 mg/dL — AB (ref 0.61–1.24)
Chloride: 102 mmol/L (ref 101–111)
GFR calc Af Amer: 52 mL/min — ABNORMAL LOW (ref >60)
GFR, EST NON AFRICAN AMERICAN: 45 mL/min — AB (ref >60)
GLUCOSE: 102 mg/dL — AB (ref 65–99)
Potassium: 4 mmol/L (ref 3.5–5.1)
Sodium: 133 mmol/L — ABNORMAL LOW (ref 135–145)

## 2017-03-08 LAB — CORTISOL: Cortisol, Plasma: 20.2 ug/dL

## 2017-03-08 LAB — T4: T4 TOTAL: 4.5 ug/dL (ref 4.5–12.0)

## 2017-03-08 MED ORDER — ENOXAPARIN SODIUM 40 MG/0.4ML ~~LOC~~ SOLN
40.0000 mg | SUBCUTANEOUS | Status: DC
  Administered 2017-03-08 – 2017-03-10 (×3): 40 mg via SUBCUTANEOUS
  Filled 2017-03-08 (×3): qty 0.4

## 2017-03-08 MED ORDER — SODIUM CHLORIDE 0.9% FLUSH: 10.0000 mL | INTRAVENOUS | Status: DC | PRN

## 2017-03-08 MED ORDER — OXYCODONE-ACETAMINOPHEN 5-325 MG PO TABS
1.0000 | ORAL_TABLET | Freq: Four times a day (QID) | ORAL | Status: DC | PRN
  Administered 2017-03-08 – 2017-03-09 (×2): 1 via ORAL
  Filled 2017-03-08 (×2): qty 1

## 2017-03-08 NOTE — Progress Notes (Signed)
Generalized weakness with limited appetite. Orthostatic vs's now stable. IVF's continued. Cipro IV continued. Nephrostomies WNL's. Slept at intervals. Wife and family in to see pt. PT consult done.  Denies pain.

## 2017-03-08 NOTE — Care Management Note (Addendum)
Case Management Note  Patient Details  Name: Austin Contreras MRN: [id number] Date of Birth: January 05, 1926  Subjective/Objective:     83yo Austin Contreras was admitted with hypotensin and no appetite x 3 weeks. Started chemotherapy for metastatic bladder cancer 3 weeks ago and his inability to tolerate food and water began at that time.Marland Kitchen Has a pacemaker and  left and right nephrostomy tubes going to his kidneys. Resides at home with his wife. PCP=Dr Frazier Richards. Pharmacy= [facility] on [address] in [place]. Uses a cane and a walker at home as needed. Wife denies any other home equipment needs. Possible discharge home with new oxygen due to lung metastasis.. Currently has no home health services. [facility]-PT is recommending home health PT. Case management will follow for discharge planning.                Action/Plan:   Expected Discharge Date:  03/10/17               Expected Discharge Plan:     In-House Referral:     Discharge planning Services     Post Acute Care Choice:    Choice offered to:     DME Arranged:    DME Agency:     HH Arranged:    HH Agency:     Status of Service:     If discussed at [employer], dates discussed:    Additional Comments:  Austin Contreras A, RN 03/08/2017, 2:27 PM

## 2017-03-08 NOTE — Progress Notes (Signed)
[facility] [place] NAME: Austin Contreras    MR#:  [id number]  DATE OF BIRTH:  June 05, 1926  SUBJECTIVE: 83 year old male patient with history of cancer is getting chemotherapy from cancer center because of hypotension. Admitted for the same. Started on IV fluids, antibiotics for urine infection. Patient says that he feels little better. And wants to eat today.   CHIEF COMPLAINT:  No chief complaint on file.   REVIEW OF SYSTEMS:   ROS CONSTITUTIONAL: No fever, has fatigue and weakness.  EYES: No blurred or double vision.  EARS, NOSE, AND THROAT: No tinnitus or ear pain.  RESPIRATORY: No cough, shortness of breath, wheezing or hemoptysis.  CARDIOVASCULAR: No chest pain, orthopnea, edema.  GASTROINTESTINAL: No nausea, vomiting, diarrhea or abdominal pain.  GENITOURINARY: No dysuria, hematuria.  ENDOCRINE: No polyuria, nocturia,  HEMATOLOGY: No anemia, easy bruising or bleeding SKIN: No rash or lesion. MUSCULOSKELETAL: No joint pain or arthritis.   NEUROLOGIC: No tingling, numbness, weakness.  PSYCHIATRY: No anxiety or depression.   DRUG ALLERGIES:   Allergies  Allergen Reactions  . Penicillins Rash and Other (See Comments)    Has patient had a PCN reaction causing immediate rash, facial/tongue/throat swelling, SOB or lightheadedness with hypotension: No Has patient had a PCN reaction causing severe rash involving mucus membranes or skin necrosis: No Has patient had a PCN reaction that required hospitalization No Has patient had a PCN reaction occurring within the last 10 years: No If all of the above answers are "NO", then may proceed with Cephalosporin use.  Marland Kitchen Flu Virus Vaccine Other (See Comments)    Times 3    VITALS:  Blood pressure 106/62, pulse 72, temperature 97.8 F (36.6 C), resp. rate 20, height 5\' 10"  (1.778 m), weight 63.5 kg (140 lb), SpO2 92 %.  PHYSICAL EXAMINATION:  GENERAL:  83 y.o.-year-old patient lying in  the bed with no acute distress.  EYES: Pupils equal, round, reactive to light and accommodation. No scleral icterus. Extraocular muscles intact.  HEENT: Head atraumatic, normocephalic. Oropharynx and nasopharynx clear.  NECK:  Supple, no jugular venous distention. No thyroid enlargement, no tenderness.  LUNGS: Normal breath sounds bilaterally, no wheezing, rales,rhonchi or crepitation. No use of accessory muscles of respiration.  CARDIOVASCULAR: S1, S2 normal. No murmurs, rubs, or gallops.  ABDOMEN: Soft, nontender, nondistended. Bowel sounds present. No organomegaly or mass.bilateral nephrostomy tubes present.urine appears slightlpy cloudy.  EXTREMITIES: No pedal edema, cyanosis, or clubbing.  NEUROLOGIC: Cranial nerves II through XII are intact. Muscle strength 5/5 in all extremities. Sensation intact. Gait not checked.  PSYCHIATRIC: The patient is alert and oriented x 3.  SKIN: No obvious rash, lesion, or ulcer.    LABORATORY PANEL:   CBC  Recent Labs Lab 03/08/17 0452  WBC 15.2*  HGB 7.5*  HCT 23.9*  PLT 476*   ------------------------------------------------------------------------------------------------------------------  Chemistries   Recent Labs Lab 03/07/17 0905 03/08/17 0452  NA 131* 133*  K 4.2 4.0  CL 98* 102  CO2 22 24  GLUCOSE 127* 102*  BUN 28* 25*  CREATININE 1.62* 1.35*  CALCIUM 8.7* 7.9*  AST 23  --   ALT 15*  --   ALKPHOS 155*  --   BILITOT 0.6  --    ------------------------------------------------------------------------------------------------------------------  Cardiac Enzymes No results for input(s): TROPONINI in the last 168 hours. ------------------------------------------------------------------------------------------------------------------  RADIOLOGY:  Dg Chest Port 1 View  Result Date: 03/07/2017 CLINICAL DATA:  Current history of metastatic lung cancer. Acute onset of dehydration  and generalized weakness. Initial encounter. EXAM:  PORTABLE CHEST 1 VIEW COMPARISON:  Chest radiograph and CT of the chest performed 01/31/2017 FINDINGS: There has been marked interval progression of diffuse metastatic disease throughout both lungs, with numerous masses of varying size seen bilaterally. No pleural effusion or pneumothorax is seen. No definite superimposed focal airspace consolidation is identified, though evaluation for airspace disease is limited given metastases. The patient's right-sided chest port is noted ending about the mid SVC. The cardiomediastinal silhouette remains normal in size. The patient is status post median sternotomy, with changes of prior CABG. A pacemaker is noted overlying the left chest wall, with leads ending overlying the right atrium and right ventricle. No acute osseous abnormalities are seen. IMPRESSION: Marked interval progression of diffuse metastatic disease throughout the lungs, with numerous masses of varying size seen bilaterally. Electronically Signed   By: Garald Balding M.D.   On: 03/07/2017 20:06    EKG:   Orders placed or performed in visit on 02/11/17  . EKG 12-Lead    ASSESSMENT AND PLAN:   #1 hypotension with poor appetite and dehydration: Improved with IV fluids. Continue to hold the enalapril today. Continue IV hydration for today. #2 UTI: Started on Cipro, follow urine cultures. #3 generalized weakness, poor poor by mouth intake secondary to metastatic bladder cancer: #4 acute on chronic renal failure: Improving with IV hydration.  5 leukocytosis secondary to urine infection: WBC is coming down from 17-15. Continue IV antibiotics. 6.History of atrial fibrillation. On sotalol  All the records are reviewed and case discussed with Care Management/Social Workerr. Management plans discussed with the patient, family and they are in agreement.  CODE STATUS:           dnr  TOTAL TIME TAKING CARE OF THIS PATIENT: 35 minutes.   POSSIBLE D/C IN 1-2DAYS, DEPENDING ON CLINICAL  CONDITION.   Epifanio Lesches M.D on 03/08/2017 at 8:30 AM  Between 7am to 6pm - Pager - [phone]  After 6pm go to [email or web address] - password [email or web address] [facility]  Office  [phone]  CC: Primary care physician; [name], MD   Note: This dictation was prepared with Dragon dictation along with smaller phrase technology. Any transcriptional errors that result from this process are unintentional.

## 2017-03-08 NOTE — Progress Notes (Signed)
Percocet given for pain with pt sleeping at long interavls. Arouses easily and returns to rest. Reports pain relieved. Son reports pt doesn't take much pain medication. VS's stable; lower normal with pt sleeping/resting quietly. Encouraged to eat supper and take po's.

## 2017-03-08 NOTE — Progress Notes (Signed)
Physical Therapy Evaluation Patient Details Name: Austin Contreras MRN: [id number] DOB: May 27, 1926 Today's Date: 03/08/2017   History of Present Illness  Patient is an 83 y.o. male admitted on 23 March for hypotension. Patient has metastatic bladder CA and has been undergoing chemotherapy for 3 weeks. PMH includes atrial fibrillation, HLD, CKDIII, leukocytosis, and anemia.  Clinical Impression  Patient is a pleasant male admitted for above listed reasons. Patient previously modified independent with household ambulation, only utilizing SPC on L when performing short community ambulation. On evaluation, gait assessment deferred due to low BP; however, he demonstrates mild unsteadiness with hand hold assist and dynamic standing balance assessment. It was recommended that he utilize RW to prevent falls, as he required minimal assistance to correct LOB. Patient will benefit from skilled and progressive PT to improve overall functional strength and balance, as he undergoes chemotherapy to improve his overall QOL. It is believed he will benefit from f/u HHPT at discharge when medically ready.    Follow Up Recommendations Home health PT    Equipment Recommendations  Other (comment) (Tub bench)    Recommendations for Other Services       Precautions / Restrictions Precautions Precautions: Fall Restrictions Weight Bearing Restrictions: No      Mobility  Bed Mobility Overal bed mobility: Modified Independent             General bed mobility comments: Patient performed supine to sit and sit to supine transfer with modified independence, utilizing bed rails. Patient able to bridge through LEs to scoot to [facility]  Transfers Overall transfer level: Modified independent Equipment used: 1 person hand held assist             General transfer comment: Patient performs sit to stand with HHA on L. Dynamic standing balance performed with minimal assistance to correct LOB.  Ambulation/Gait              General Gait Details: Not performed due to BP  Stairs            Wheelchair Mobility    Modified Rankin (Stroke Patients Only)       Balance Overall balance assessment: Needs assistance Sitting-balance support: Feet supported;Bilateral upper extremity supported Sitting balance-Leahy Scale: Good Sitting balance - Comments: Increased fatigue sitting at EOB ~1 min.   Standing balance support: Single extremity supported Standing balance-Leahy Scale: Fair Standing balance comment: Patient unable to perform SLS without HHA and minor LOB, increasing unsteadiness with weightshifting fwd/bwd/lateral.                             Pertinent Vitals/Pain Pain Assessment: No/denies pain    Home Living Family/patient expects to be discharged to:: Private residence Living Arrangements: Spouse/significant other Available Help at Discharge: Family;Available 24 hours/day Type of Home: House Home Access: Level entry     Home Layout: One level Home Equipment: Walker - 2 wheels;Cane - single point;Grab bars - toilet;Grab bars - tub/shower      Prior Function Level of Independence: Needs assistance   Gait / Transfers Assistance Needed: Household distances independently; SPC on L for short community ambulation  ADL's / Homemaking Assistance Needed: Performed by wife        Hand Dominance   Dominant Hand: Left    Extremity/Trunk Assessment   Upper Extremity Assessment Upper Extremity Assessment: Overall WFL for tasks assessed    Lower Extremity Assessment Lower Extremity Assessment: Overall WFL for tasks assessed  Communication   Communication: No difficulties  Cognition Arousal/Alertness: Awake/alert Behavior During Therapy: WFL for tasks assessed/performed Overall Cognitive Status: Within Functional Limits for tasks assessed                                        General Comments      Exercises     Assessment/Plan     PT Assessment Patient needs continued PT services  PT Problem List Decreased strength;Decreased activity tolerance;Decreased balance;Decreased mobility;Decreased knowledge of use of DME;Cardiopulmonary status limiting activity       PT Treatment Interventions DME instruction;Gait training;Functional mobility training;Therapeutic activities;Therapeutic exercise;Balance training;Patient/family education    PT Goals (Current goals can be found in the Care Plan section)  Acute Rehab PT Goals Patient Stated Goal: "To feel better" PT Goal Formulation: With patient/family Time For Goal Achievement: 03/22/17 Potential to Achieve Goals: Good    Frequency Min 2X/week   Barriers to discharge        Co-evaluation               End of Session Equipment Utilized During Treatment: Gait belt Activity Tolerance: Patient limited by fatigue Patient left: in bed;with call bell/phone within reach;with bed alarm set;with family/visitor present   PT Visit Diagnosis: Unsteadiness on feet (R26.81);Muscle weakness (generalized) (M62.81)    Time: 1152-1209 PT Time Calculation (min) (ACUTE ONLY): 17 min   Charges:   PT Evaluation $PT Eval Low Complexity: 1 Procedure     PT G Codes:         Dorice Lamas, PT, DPT 03/08/2017, 12:25 PM

## 2017-03-08 NOTE — Progress Notes (Signed)
Lovenox changed to 40 mg daily for BMI <40 and CrCl >30 per protocol.

## 2017-03-09 MED ORDER — SODIUM CHLORIDE 0.9% FLUSH
10.0000 mL | Freq: Two times a day (BID) | INTRAVENOUS | Status: DC
  Administered 2017-03-09 – 2017-03-11 (×4): 10 mL via INTRAVENOUS

## 2017-03-09 MED ORDER — ADULT MULTIVITAMIN W/MINERALS CH
1.0000 | ORAL_TABLET | Freq: Every day | ORAL | Status: DC
  Administered 2017-03-09 – 2017-03-11 (×3): 1 via ORAL
  Filled 2017-03-09 (×3): qty 1

## 2017-03-09 MED ORDER — LEVOTHYROXINE SODIUM 25 MCG PO TABS
25.0000 ug | ORAL_TABLET | Freq: Every day | ORAL | Status: DC
  Administered 2017-03-09 – 2017-03-11 (×3): 25 ug via ORAL
  Filled 2017-03-09 (×3): qty 1

## 2017-03-09 MED ORDER — ENSURE ENLIVE PO LIQD
237.0000 mL | Freq: Three times a day (TID) | ORAL | Status: DC
  Administered 2017-03-09 – 2017-03-11 (×8): 237 mL via ORAL

## 2017-03-09 NOTE — Progress Notes (Signed)
[facility] [place] NAME: Abubakr Wieman    MR#:  [id number]  DATE OF BIRTH:  1926/08/01  SUBJECTIVE: 83 year old male patient with history of cancer is getting chemotherapy from cancer center because of hypotension. Admitted for the same. Started on IV fluids, antibiotics for urine infection.still poor po intake.   CHIEF COMPLAINT:  No chief complaint on file.   REVIEW OF SYSTEMS:   ROS CONSTITUTIONAL: No fever, has fatigue and weakness.  EYES: No blurred or double vision.  EARS, NOSE, AND THROAT: No tinnitus or ear pain.  RESPIRATORY: No cough, shortness of breath, wheezing or hemoptysis.  CARDIOVASCULAR: No chest pain, orthopnea, edema.  GASTROINTESTINAL: No nausea, vomiting, diarrhea or abdominal pain.  GENITOURINARY: No dysuria, hematuria.  ENDOCRINE: No polyuria, nocturia,  HEMATOLOGY: No anemia, easy bruising or bleeding SKIN: No rash or lesion. MUSCULOSKELETAL: No joint pain or arthritis.   NEUROLOGIC: No tingling, numbness, weakness.  PSYCHIATRY: No anxiety or depression.   DRUG ALLERGIES:   Allergies  Allergen Reactions  . Penicillins Rash and Other (See Comments)    Has patient had a PCN reaction causing immediate rash, facial/tongue/throat swelling, SOB or lightheadedness with hypotension: No Has patient had a PCN reaction causing severe rash involving mucus membranes or skin necrosis: No Has patient had a PCN reaction that required hospitalization No Has patient had a PCN reaction occurring within the last 10 years: No If all of the above answers are "NO", then may proceed with Cephalosporin use.  Marland Kitchen Flu Virus Vaccine Other (See Comments)    Times 3    VITALS:  Blood pressure 121/70, pulse 78, temperature 98.4 F (36.9 C), temperature source Oral, resp. rate 20, height 5\' 10"  (1.778 m), weight 63.5 kg (140 lb), SpO2 94 %.  PHYSICAL EXAMINATION:  GENERAL:  83 y.o.-year-old patient lying in the bed with no acute  distress.  EYES: Pupils equal, round, reactive to light and accommodation. No scleral icterus. Extraocular muscles intact.  HEENT: Head atraumatic, normocephalic. Oropharynx and nasopharynx clear.  NECK:  Supple, no jugular venous distention. No thyroid enlargement, no tenderness.  LUNGS: Normal breath sounds bilaterally, no wheezing, rales,rhonchi or crepitation. No use of accessory muscles of respiration.  CARDIOVASCULAR: S1, S2 normal. No murmurs, rubs, or gallops.  ABDOMEN: Soft, nontender, nondistended. Bowel sounds present. No organomegaly or mass.bilateral nephrostomy tubes present.urine appears slightlpy cloudy.  EXTREMITIES: No pedal edema, cyanosis, or clubbing.  NEUROLOGIC: Cranial nerves II through XII are intact. Muscle strength 5/5 in all extremities. Sensation intact. Gait not checked.  PSYCHIATRIC: The patient is alert and oriented x 3.  SKIN: No obvious rash, lesion, or ulcer.    LABORATORY PANEL:   CBC  Recent Labs Lab 03/08/17 0452  WBC 15.2*  HGB 7.5*  HCT 23.9*  PLT 476*   ------------------------------------------------------------------------------------------------------------------  Chemistries   Recent Labs Lab 03/07/17 0905 03/08/17 0452  NA 131* 133*  K 4.2 4.0  CL 98* 102  CO2 22 24  GLUCOSE 127* 102*  BUN 28* 25*  CREATININE 1.62* 1.35*  CALCIUM 8.7* 7.9*  AST 23  --   ALT 15*  --   ALKPHOS 155*  --   BILITOT 0.6  --    ------------------------------------------------------------------------------------------------------------------  Cardiac Enzymes No results for input(s): TROPONINI in the last 168 hours. ------------------------------------------------------------------------------------------------------------------  RADIOLOGY:  Dg Chest Port 1 View  Result Date: 03/07/2017 CLINICAL DATA:  Current history of metastatic lung cancer. Acute onset of dehydration and generalized weakness. Initial encounter. EXAM:  PORTABLE CHEST 1 VIEW  COMPARISON:  Chest radiograph and CT of the chest performed 01/31/2017 FINDINGS: There has been marked interval progression of diffuse metastatic disease throughout both lungs, with numerous masses of varying size seen bilaterally. No pleural effusion or pneumothorax is seen. No definite superimposed focal airspace consolidation is identified, though evaluation for airspace disease is limited given metastases. The patient's right-sided chest port is noted ending about the mid SVC. The cardiomediastinal silhouette remains normal in size. The patient is status post median sternotomy, with changes of prior CABG. A pacemaker is noted overlying the left chest wall, with leads ending overlying the right atrium and right ventricle. No acute osseous abnormalities are seen. IMPRESSION: Marked interval progression of diffuse metastatic disease throughout the lungs, with numerous masses of varying size seen bilaterally. Electronically Signed   By: Garald Balding M.D.   On: 03/07/2017 20:06    EKG:   Orders placed or performed in visit on 02/11/17  . EKG 12-Lead    ASSESSMENT AND PLAN:   #1 hypotension with poor appetite and dehydration: Improved with IV fluids. Continue to hold the enalapril today. Continue IV hydration for today. #2 UTI: Started on Cipro, follow urine cultures. #3 generalized weakness, poor poor by mouth intake secondary to metastatic bladder cancer:  #4 acute on chronic renal failure: Improving with IV hydration.  5 leukocytosis secondary to urine infection: WBC is coming down from 17-15. Continue IV antibiotics. Left nephrostomy tube urine culture negative.right side Ostomy urine cultures are pending. Continue IV Cipro.   6.History of atrial fibrillation. On sotalol will liberalize  His  diet because patient is not eating well. Changed to regular diet as per wife 'srequest  7 deconditioning physical therapy recommending home health. 8.metastatic bladder cancer, metastases to lungs:  Has hypoxia with sats 92% on room air: Check ambulatory oxygen saturation to see if he qualifies for home oxygen.  All the records are reviewed and case discussed with Care Management/Social Workerr. Management plans discussed with the patient, family and they are in agreement.  CODE STATUS:           dnr  TOTAL TIME TAKING CARE OF THIS PATIENT: 35 minutes.   POSSIBLE D/C IN 1-2DAYS, DEPENDING ON CLINICAL CONDITION.   Epifanio Lesches M.D on 03/09/2017 at 11:15 AM  Between 7am to 6pm - Pager - [phone]  After 6pm go to [email or web address] - password [email or web address] [facility]  Office  [phone]  CC: Primary care physician; [name], MD   Note: This dictation was prepared with Dragon dictation along with smaller phrase technology. Any transcriptional errors that result from this process are unintentional.

## 2017-03-09 NOTE — Progress Notes (Signed)
Pt denies pain. Reports does not like "sweet" tasting foods/supplements. Appetite remains poor but tolerating intake. Family at bedside. IVF's discontinued. Generalized weakness; color sallow.

## 2017-03-09 NOTE — Progress Notes (Signed)
Initial Nutrition Assessment  DOCUMENTATION CODES:   Severe malnutrition in context of chronic illness  INTERVENTION:  Initially downgraded patient's diet from Soft (GI soft- low fiber) to Dysphagia 3, but family later requested a regular diet so they can pick and choose what to provide patient.  Provide Ensure Enlive po TID, each supplement provides 350 kcal and 20 grams of protein.  Provide whole milk and pudding on each tray. Added note in [employer].  Provide multivitamin with minerals daily.  Provided patient and family with High-Calorie, High-Protein Nutrition Therapy and High-Calorie, High-Protein Recipes. Discussed recommendation to have 6 small meals throughout the day of foods patient can tolerate that are high in calories and protein.   NUTRITION DIAGNOSIS:   Malnutrition (Severe) related to chronic illness (metastatic bladder carcinoma) as evidenced by severe depletion of body fat, severe depletion of muscle mass, 17.7 percent weight loss over 4-5 months.  GOAL:   Patient will meet greater than or equal to 90% of their needs  MONITOR:   PO intake, Supplement acceptance, Labs, Weight trends, I & O's  REASON FOR ASSESSMENT:   Malnutrition Screening Tool    ASSESSMENT:   83 year old male with PMHx of metastatic bladder carcinoma, B12 deficiency, GERD, HTN, HLD, CAD, CKD III, PAF presents with hypotension and dehydration.   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 He underwent radiation therapy but has refused chemotherapy. Now has bilateral nephrostomy tubes. Found to have bilateral lung lesions consistent with metastatic disease and also has suspicion for nodal metastasis. Patient received one cycle of palliative Keytruda on 02/14/2017, but was held on 3/23 and patient did not receive.  Spoke with patient and family at bedside. Patient reports he has had a "rotten" appetite  for the past 2-3 months. He is attempting to eat 3 meals per day but can only tolerate small amounts of apple sauce, creamed potatoes, pot roast with gravy. Breakfast is usually one egg and some bites of grits. Patient has also been drinking 1 Boost daily at home. He reports difficulty swallowing, dry mouth, and taste changes all impacting intake. Patient can only have moist foods or liquids at this time.   Patient is amenable to trying Ensure Enlive to help with meeting calorie and protein needs. Discussed family drinking more of the ONS they have at home daily or getting one with high calorie and protein content. Family also requests RD add note in chart for certain foods to come at each meal (such as milk, pudding). Patient was very adamant that if he didn't like these supplements that he didn't want to be "forced" to eat or drink anything. Assured patient that we would not force him to drink or eat anything and encouraged him to just do the best he could. Patient may benefit from palliative medicine consult to discuss goals of care and views towards artificial nutrition if he does pursue further oncology treatment.   UBW was 170 lbs. Per chart patient was 170.1 lbs on 10/23/2016 and has lost 30.1 lbs (17.7% body weight) over 4-5 months, which is significant for time frame.  Meal Completion: 30-50% of meals on 3/24. Patient had approximately 577 kcal (30% minimum estimated kcal needs) and 22 grams of protein (23% minimum estimated protein needs) on 3/24.  Medications reviewed and include: vitamin D 1000 units daily, vitamin B12 1000 micrograms every 30 days IM, Megace 200 mg daily, NS @ 50 ml/hr.  Labs  reviewed: Sodium 133, BUN 25, Creatinine 1.35, EGFR 45.   Nutrition-Focused physical exam completed. Findings are severe fat depletion, severe muscle depletion, and no edema.   Discussed with RN.   Diet Order:  DIET DYS 3 Room service appropriate? Yes; Fluid consistency: Thin  Skin:  Reviewed, no  issues  Last BM:  PTA  Height:   Ht Readings from Last 1 Encounters:  03/07/17 _0  (1.778 m)    Weight:   Wt Readings from Last 1 Encounters:  03/07/17 140 lb (63.5 kg)    Ideal Body Weight:  75.5 kg  BMI:  Body mass index is 20.09 kg/m.  Estimated Nutritional Needs:   Kcal:  1900-2200 (30-35 kcal/kg)  Protein:  95-115 grams (1.5-1.8 grams/kg)  Fluid:  1.6 L/day (25 ml/kg)  EDUCATION NEEDS:   Education needs addressed  Willey Blade, MS, RD, LDN Pager: [phone] After Hours Pager: [phone]

## 2017-03-10 ENCOUNTER — Other Ambulatory Visit: Payer: Self-pay

## 2017-03-10 ENCOUNTER — Encounter: Payer: Self-pay | Admitting: Internal Medicine

## 2017-03-10 LAB — BASIC METABOLIC PANEL
ANION GAP: 7 (ref 5–15)
BUN: 17 mg/dL (ref 6–20)
CALCIUM: 8.6 mg/dL — AB (ref 8.9–10.3)
CO2: 25 mmol/L (ref 22–32)
CREATININE: 1.31 mg/dL — AB (ref 0.61–1.24)
Chloride: 99 mmol/L — ABNORMAL LOW (ref 101–111)
GFR, EST AFRICAN AMERICAN: 54 mL/min — AB (ref >60)
GFR, EST NON AFRICAN AMERICAN: 47 mL/min — AB (ref >60)
Glucose, Bld: 101 mg/dL — ABNORMAL HIGH (ref 65–99)
Potassium: 4.2 mmol/L (ref 3.5–5.1)
Sodium: 131 mmol/L — ABNORMAL LOW (ref 135–145)

## 2017-03-10 LAB — CBC WITH DIFFERENTIAL/PLATELET
BASOS ABS: 0 10*3/uL (ref 0–0.1)
BASOS PCT: 0 %
EOS ABS: 0.1 10*3/uL (ref 0–0.7)
EOS PCT: 0 %
HCT: 25.9 % — ABNORMAL LOW (ref 40.0–52.0)
Hemoglobin: 8.1 g/dL — ABNORMAL LOW (ref 13.0–18.0)
Lymphocytes Relative: 8 %
Lymphs Abs: 1.2 10*3/uL (ref 1.0–3.6)
MCH: 24.5 pg — ABNORMAL LOW (ref 26.0–34.0)
MCHC: 31.3 g/dL — AB (ref 32.0–36.0)
MCV: 78.3 fL — ABNORMAL LOW (ref 80.0–100.0)
MONO ABS: 1.2 10*3/uL — AB (ref 0.2–1.0)
MONOS PCT: 8 %
Neutro Abs: 12.2 10*3/uL — ABNORMAL HIGH (ref 1.4–6.5)
Neutrophils Relative %: 84 %
PLATELETS: 492 10*3/uL — AB (ref 150–440)
RBC: 3.31 MIL/uL — ABNORMAL LOW (ref 4.40–5.90)
RDW: 17.5 % — AB (ref 11.5–14.5)
WBC: 14.6 10*3/uL — ABNORMAL HIGH (ref 3.8–10.6)

## 2017-03-10 LAB — ACTH: C206 ACTH: 25.9 pg/mL (ref 7.2–63.3)

## 2017-03-10 MED ORDER — SOTALOL HCL 80 MG PO TABS
80.0000 mg | ORAL_TABLET | Freq: Two times a day (BID) | ORAL | Status: DC
  Administered 2017-03-11: 80 mg via ORAL
  Filled 2017-03-10 (×2): qty 1

## 2017-03-10 MED ORDER — CIPROFLOXACIN IN D5W 400 MG/200ML IV SOLN
400.0000 mg | Freq: Two times a day (BID) | INTRAVENOUS | Status: DC
  Administered 2017-03-10 – 2017-03-11 (×2): 400 mg via INTRAVENOUS
  Filled 2017-03-10 (×3): qty 200

## 2017-03-10 MED ORDER — CIPROFLOXACIN HCL 250 MG PO TABS: 250.0000 mg | ORAL_TABLET | Freq: Two times a day (BID) | ORAL | 0 refills | Status: AC

## 2017-03-10 MED ORDER — ENSURE ENLIVE PO LIQD: 237.0000 mL | Freq: Three times a day (TID) | ORAL | 0 refills | Status: AC

## 2017-03-10 NOTE — Progress Notes (Signed)
Ciprofloxacin dose increased to 400mg  IV q 12 due to scr now >18ml/min  [place] D Luiz Trumpower, Pharm.D, BCPS Clinical Pharmacist

## 2017-03-10 NOTE — Plan of Care (Signed)
Problem: Physical Regulation: Goal: Will remain free from infection Outcome: Progressing Pt receiving IV antibiotics  Problem: Skin Integrity: Goal: Risk for impaired skin integrity will decrease Outcome: Progressing Pink foam dressing applied to skin tear on right wrist

## 2017-03-10 NOTE — Care Management (Signed)
Physical therapy evaluation completed. Recommending home with home health and physical therapy. No family available to discuss these services. Austin Contreras sleeping.  Would like to discuss [facility] when family available. Austin Ammons RN MSN CCM Care Management

## 2017-03-10 NOTE — Progress Notes (Signed)
Physical Therapy Treatment Patient Details Name: Austin Contreras MRN: [id number] DOB: 1926-11-26 Today's Date: 03/10/2017    History of Present Illness Patient is an 83 y.o. male admitted on 23 March for hypotension. Patient has metastatic bladder CA and has been undergoing chemotherapy for 3 weeks. PMH includes atrial fibrillation, s/p pacemaker, HLD, CKDIII, leukocytosis, and anemia.    PT Comments    Prior to PT session, nursing reported that pt was presenting with atrial fibrillation with RVR. Pt also presented with reduced BP at beginning of session. D/t current medical status, pt remained in bed and performed LE exercises, as it was not deemed safe for pt to ambulate or transfer with low BP. Pt was given rest breaks throughout session d/t mild SOB, and HR was monitored throughout session. Nursing reported that pt ambulated to the bathroom this morning and was light headed once, but was able to complete the task. Pt would benefit from continued skilled PT to improve strength and activity tolerance. Will evaluate ambulation during next session as medically appropriate.     Follow Up Recommendations  Home health PT     Equipment Recommendations  Other (comment) (tub bench)    Recommendations for Other Services       Precautions / Restrictions Precautions Precautions: Fall Restrictions Weight Bearing Restrictions: No Other Position/Activity Restrictions:     Mobility  Bed Mobility                  Transfers                    Ambulation/Gait                 Stairs            Wheelchair Mobility    Modified Rankin (Stroke Patients Only)       Balance                                            Cognition Arousal/Alertness: Awake/alert Behavior During Therapy: WFL for tasks assessed/performed Overall Cognitive Status: Within Functional Limits for tasks assessed                                         Exercises Total Joint Exercises Towel Squeeze: AROM;Both;10 reps;Supine (in hooklying with pillow between knees.) General Exercises - Lower Extremity Ankle Circles/Pumps: AROM;Both;10 reps;Supine Short Arc Quad: AROM;Both;10 reps;Supine Heel Slides: AROM;Both;10 reps;Supine Straight Leg Raises: AROM;Both;10 reps;Supine    General Comments        Pertinent Vitals/Pain Pain Assessment: No/denies pain  Pt. HR was taken in supine manually for 60 seconds throughout PT session to monitor with activity. HR maintained between 108-122 bpm. Pt's BP was 89/54 when taken in supine with L UE prior to PT session; BP was 100/60 when taken at the end of PT session.      Home Living                      Prior Function            PT Goals (current goals can now be found in the care plan section) Acute Rehab PT Goals Patient Stated Goal: "To feel better" PT Goal Formulation: With patient/family Time For Goal Achievement: 03/22/17 Progress  towards PT goals: Progressing toward goals    Frequency    Min 2X/week      PT Plan Current plan remains appropriate    Co-evaluation             End of Session   Activity Tolerance: Patient tolerated treatment well;Treatment limited secondary to medical complications (Comment) (Pt. presented with A-fib with RVR and low BP prior to treatment.) Patient left: in bed;with bed alarm set;with family/visitor present;with call bell/phone within reach Nurse Communication: Other (comment) (Reported BP and manual HR recordings taken during session to nursing staff. ) PT Visit Diagnosis: Unsteadiness on feet (R26.81);Muscle weakness (generalized) (M62.81)     Time: 9458-5929 PT Time Calculation (min) (ACUTE ONLY): 22 min  Charges:                       G Codes:         Fredderick Swanger, SPT 03/10/2017, 3:24 PM

## 2017-03-10 NOTE — Discharge Instructions (Signed)
Regular diet. °Activity as tolerated. °

## 2017-03-10 NOTE — Progress Notes (Signed)
[facility] NAME: Wyett Narine    MR#:  [id number]  DATE OF BIRTH:  Feb 28, 1926  SUBJECTIVE:   CHIEF COMPLAINT:  No chief complaint on file.  Feels ok. NO CP/Abd pain Feels weak No hematuria Afebrile  REVIEW OF SYSTEMS:   ROS CONSTITUTIONAL: No fever, has fatigue and weakness.  EYES: No blurred or double vision.  EARS, NOSE, AND THROAT: No tinnitus or ear pain.  RESPIRATORY: No cough, shortness of breath, wheezing or hemoptysis.  CARDIOVASCULAR: No chest pain, orthopnea, edema.  GASTROINTESTINAL: No nausea, vomiting, diarrhea or abdominal pain.  GENITOURINARY: No dysuria, hematuria.  ENDOCRINE: No polyuria, nocturia,  HEMATOLOGY: No anemia, easy bruising or bleeding SKIN: No rash or lesion. MUSCULOSKELETAL: No joint pain or arthritis.   NEUROLOGIC: No tingling, numbness, weakness.  PSYCHIATRY: No anxiety or depression.   DRUG ALLERGIES:   Allergies  Allergen Reactions  . Penicillins Rash and Other (See Comments)    Has patient had a PCN reaction causing immediate rash, facial/tongue/throat swelling, SOB or lightheadedness with hypotension: No Has patient had a PCN reaction causing severe rash involving mucus membranes or skin necrosis: No Has patient had a PCN reaction that required hospitalization No Has patient had a PCN reaction occurring within the last 10 years: No If all of the above answers are "NO", then may proceed with Cephalosporin use.  Marland Kitchen Flu Virus Vaccine Other (See Comments)    Times 3    VITALS:  Blood pressure 94/70, pulse (!) 115, temperature 98.7 F (37.1 C), temperature source Oral, resp. rate 18, height 5\' 10"  (1.778 m), weight 63.5 kg (140 lb), SpO2 94 %.  PHYSICAL EXAMINATION:  GENERAL:  83 y.o.-year-old patient lying in the bed with no acute distress.  EYES: Pupils equal, round, reactive to light and accommodation. No scleral icterus. Extraocular muscles intact.  HEENT: Head  atraumatic, normocephalic. Oropharynx and nasopharynx clear.  NECK:  Supple, no jugular venous distention. No thyroid enlargement, no tenderness.  LUNGS: Normal breath sounds bilaterally, no wheezing, rales,rhonchi or crepitation. No use of accessory muscles of respiration.  CARDIOVASCULAR: S1, S2 normal. No murmurs, rubs, or gallops.  ABDOMEN: Soft, nontender, nondistended. Bowel sounds present. No organomegaly or mass.bilateral nephrostomy tubes present. EXTREMITIES: No pedal edema, cyanosis, or clubbing.  NEUROLOGIC: Cranial nerves II through XII are intact. Muscle strength 5/5 in all extremities. Sensation intact. Gait not checked.  PSYCHIATRIC: The patient is alert and oriented x 3.  SKIN: No obvious rash, lesion, or ulcer.    LABORATORY PANEL:   CBC  Recent Labs Lab 03/10/17 0807  WBC 14.6*  HGB 8.1*  HCT 25.9*  PLT 492*   ------------------------------------------------------------------------------------------------------------------  Chemistries   Recent Labs Lab 03/07/17 0905  03/10/17 0807  NA 131*  < > 131*  K 4.2  < > 4.2  CL 98*  < > 99*  CO2 22  < > 25  GLUCOSE 127*  < > 101*  BUN 28*  < > 17  CREATININE 1.62*  < > 1.31*  CALCIUM 8.7*  < > 8.6*  AST 23  --   --   ALT 15*  --   --   ALKPHOS 155*  --   --   BILITOT 0.6  --   --   < > = values in this interval not displayed. ------------------------------------------------------------------------------------------------------------------  Cardiac Enzymes No results for input(s): TROPONINI in the last 168 hours. ------------------------------------------------------------------------------------------------------------------  RADIOLOGY:  No results found.  EKG:  Orders placed or performed during the hospital encounter of 03/07/17  . EKG 12-Lead  . EKG 12-Lead    ASSESSMENT AND PLAN:   # hypotension with poor appetite and dehydration Stop enalapril Improving  # UTI: Started on Cipro, follow  urine cultures.  # Generalized weakness poor poor by mouth intake secondary to metastatic bladder cancer:  # Acute on chronic renal failure: Improving with IV hydration.  # leukocytosis secondary to urine infection: WBC is coming down from 17-15. Continue IV antibiotics. B/L nephrotomy Ucx GNR  # History of atrial fibrillation Now in RVR Will increase dose of sotalol  #  Metastatic bladder cancer, metastases to lungs  All the records are reviewed and case discussed with Care Management/Social Worker Management plans discussed with the patient, family and they are in agreement.  CODE STATUS:   DNR  TOTAL TIME TAKING CARE OF THIS PATIENT: 35 minutes.   POSSIBLE D/C IN 1-2DAYS, DEPENDING ON CLINICAL CONDITION.  Hillary Bow R M.D on 03/10/2017 at 2:11 PM  Between 7am to 6pm - Pager - [phone]  After 6pm go to [email or web address] - password [email or web address] [facility]  Office  [phone]  CC: Primary care physician; [name], MD   Note: This dictation was prepared with Dragon dictation along with smaller phrase technology. Any transcriptional errors that result from this process are unintentional.

## 2017-03-10 NOTE — Care Management Important Message (Signed)
Important Message  Patient Details  Name: WILMER SANTILLO MRN: [id number] Date of Birth: 1925/12/26   Medicare Important Message Given:  Yes    Shelbie Ammons, RN 03/10/2017, 9:15 AM

## 2017-03-11 ENCOUNTER — Inpatient Hospital Stay: Payer: Medicare Other

## 2017-03-11 ENCOUNTER — Ambulatory Visit: Payer: Medicare Other | Admitting: Cardiovascular Disease

## 2017-03-11 ENCOUNTER — Inpatient Hospital Stay: Payer: Medicare Other | Admitting: Oncology

## 2017-03-11 MED ORDER — HEPARIN SOD (PORK) LOCK FLUSH 100 UNIT/ML IV SOLN
500.0000 [IU] | Freq: Once | INTRAVENOUS | Status: DC
  Filled 2017-03-11: qty 5

## 2017-03-11 MED ORDER — BISACODYL 5 MG PO TBEC
10.0000 mg | DELAYED_RELEASE_TABLET | Freq: Once | ORAL | Status: AC
  Administered 2017-03-11: 11:00:00 10 mg via ORAL
  Filled 2017-03-11: qty 2

## 2017-03-11 MED ORDER — SOTALOL HCL 80 MG PO TABS: 80.0000 mg | ORAL_TABLET | Freq: Two times a day (BID) | ORAL | 0 refills | Status: AC

## 2017-03-11 MED ORDER — SENNA 8.6 MG PO TABS
1.0000 | ORAL_TABLET | Freq: Every day | ORAL | Status: DC
  Administered 2017-03-11: 11:00:00 8.6 mg via ORAL
  Filled 2017-03-11: qty 1

## 2017-03-11 NOTE — Care Management (Signed)
Discussed home health agencies with daughters Maudie Mercury and Horris Latino. Chose [facility]. Flo Shanks, RN representative for [facility] updated. Shelbie Ammons RN MSN CCM Care Management

## 2017-03-11 NOTE — Progress Notes (Signed)
New referral for [facility] services of skilled nursing and physical therapy received from [place]. MR. Lamadrid is an 83 year old ma with a known history of metastatic bladder cancer admitted to [facility] on 3/23 for evaluation and treatment of hypotension.He has received IV fluids and antibiotics.  Per chart note review he has been undergoing chemo and has had poor oral intake with documented weight loss. Plan is for discharge home today via [facility]. PT evaluation recommended a tub bench. Patient seen lying in bed, alert and interactive. [facility] services outlined, patient is agreeable. He lives with his wife who is his primary care giver, daughters ans on also involved in his care.  Writer also spoke with patient's daughters Maudie Mercury and Kathlene Cote who report the decline they have seen over the past few months. They also report that their mother takes "excellent care of" Mr. Kerins and manages his bilateral nephrostomy tubes with ease. Both daughter's expressed their concern over the decline and feel that patient will be ready for hospice "soon". At this time the plan is to continue treatment with next appointment at the [facility] on 4/3. [facility] information and contact number left with Maudie Mercury. Patient information faxed to referral. Thank you. Flo Shanks RN, BSN, [facility], hospital liaison [phone] c

## 2017-03-12 ENCOUNTER — Ambulatory Visit: Payer: Medicare Other | Admitting: Urology

## 2017-03-12 LAB — CULTURE, BLOOD (ROUTINE X 2)
Culture: NO GROWTH
Culture: NO GROWTH

## 2017-03-12 NOTE — Discharge Summary (Signed)
[facility] [place] NAME: Austin Contreras    MR#:  [id number]  DATE OF BIRTH:  [birth year]  DATE OF ADMISSION:  03/07/2017 ADMITTING PHYSICIAN: Vaughan Basta, MD  DATE OF DISCHARGE: 03/11/2017  2:19 PM  PRIMARY CARE PHYSICIAN: [name], MD   ADMISSION DIAGNOSIS:  bladder can dehydration hypotension malnutrition  DISCHARGE DIAGNOSIS:  Active Problems:   Hypotension   SECONDARY DIAGNOSIS:   Past Medical History:  Diagnosis Date  . B12 deficiency anemia 08/02/2015  . Bladder cancer ([place])    a. stage 2 s/p TUBTR;  b. s/p bilateral nephrostomy tubes.  Marland Kitchen BPH (benign prostatic hyperplasia)   . CKD (chronic kidney disease) stage 3, GFR 30-59 ml/min   . Coronary artery disease    a. 08/2012 s/p CABG x 2 (VG->RCA, VG->OM1) @ [facility];  b. H/o stress test some time after CABG (pt unsure of date) -> reportedly normal.  . GERD (gastroesophageal reflux disease)   . H/O aortic valve replacement with porcine valve    a. 08/2012 37mm Trifecta Biologic Valve.  Marland Kitchen History of GI bleed    a. ~ 2011 - required 6 units PRBC's.  . History of nephrolithiasis   . Hyperlipidemia   . Hypertension   . Mitral regurgitation   . PAF (paroxysmal atrial fibrillation) ([place])    a. Developed post-op AVR/CABG in 08/2012.  Initially treated with amio but did not tolerate.  On sotalol since late 2013.  No OAC despite CHA2DS2VASc = 4 secondary to h/o GIB and chronic hematuria in the setting of bladder cancer s/p nephrostomy tubes.  . Peripheral neuropathy ([place])   . PONV (postoperative nausea and vomiting)   . Presence of permanent cardiac pacemaker    a. 08/2012 developed heart block/junctional bradycardia following CABG/AVR-->s/p MDT CRM Advisa DR MRI SureScan [id number] Dual Chamber PPM, ser # [id number].  . SVT (supraventricular tachycardia) ([facility])      ADMITTING HISTORY  HISTORY OF PRESENT ILLNESS:  Austin Contreras  is a 83 y.o. male with a known history of  Bladder cancer that was treated in the past and now has lung nodules and received his first chemotherapy 3 weeks ago. He went today for his second chemotherapy but his blood pressure was low and he received fluids all day long at the cancer center. As per the patient and family has not been eating and drinking very well he's lost extreme pounds in the last 3 weeks. The patient states that the food does not taste good and he just can't force himself to eat. He gets nauseous and vomits if he forces himself.   HOSPITAL COURSE:   # hypotension with poor appetite and dehydration Stopped enalapril Systolic blood pressure greater than 100 by day of discharge.  # Generalized weakness poor poor by mouth intake secondary to metastatic bladder cancer: Seen by physical therapy. Home health recommended which has been set up.  # Acute on chronic renal failure: Resolved with IV fluids  # leukocytosis secondary to urine infection: WBC is coming down from 17-15. Continue IV antibiotics. B/L nephrotomy Urine cultures grew Enterobacter and  Providencia. Sensitive to ciprofloxacin. 5 more days after discharge.  # History of atrial fibrillation Patient had rapid ventricular rate during the hospital stay. His sotalol was increased from 80 mg once a day to twice a day. Heartrate in normal sinus rhythm in 90s on the day of discharge.  #  Metastatic bladder cancer, metastases to lungs Chest x-ray shows possible worsening of  metastatic cyst. Patient will follow-up with Dr. Janese Banks of oncology as outpatient. Discussed with family regarding further workup with possible CT or MRI or PET scan's as per cancer center.  Stable for discharge home.  CONSULTS OBTAINED:    DRUG ALLERGIES:   Allergies  Allergen Reactions  . Penicillins Rash and Other (See Comments)    Has patient had a PCN reaction causing immediate rash, facial/tongue/throat swelling, SOB or lightheadedness with hypotension: No Has patient had a PCN  reaction causing severe rash involving mucus membranes or skin necrosis: No Has patient had a PCN reaction that required hospitalization No Has patient had a PCN reaction occurring within the last 10 years: No If all of the above answers are "NO", then may proceed with Cephalosporin use.  Marland Kitchen Flu Virus Vaccine Other (See Comments)    Times 3    DISCHARGE MEDICATIONS:   Discharge Medication List as of 03/11/2017 12:22 PM    START taking these medications   Details  ciprofloxacin (CIPRO) 250 MG tablet Take 1 tablet (250 mg total) by mouth 2 (two) times daily., Starting Mon 03/10/2017, Normal    feeding supplement, ENSURE ENLIVE, (ENSURE ENLIVE) LIQD Take 237 mLs by mouth 3 (three) times daily between meals., Starting Mon 03/10/2017, Print      CONTINUE these medications which have CHANGED   Details  sotalol (BETAPACE) 80 MG tablet Take 1 tablet (80 mg total) by mouth 2 (two) times daily., Starting Tue 03/11/2017, Normal      CONTINUE these medications which have NOT CHANGED   Details  simvastatin (ZOCOR) 10 MG tablet Take 10 mg by mouth at bedtime. , Historical Med    acetaminophen (TYLENOL) 500 MG tablet Take 500 mg by mouth every 6 (six) hours as needed., Historical Med    azelastine (ASTELIN) 0.1 % nasal spray Place 2 sprays into both nostrils 2 (two) times daily. Use in each nostril as directed, Historical Med    cholecalciferol (VITAMIN D) 1000 units tablet Take 1,000 Units by mouth daily., Historical Med    clindamycin (CLEOCIN) 150 MG capsule Take 150 mg by mouth 3 (three) times daily as needed. , Starting Wed 11/27/2016, Historical Med    cyanocobalamin (,VITAMIN B-12,) 1000 MCG/ML injection Inject 1,000 mcg into the muscle every 30 (thirty) days., Historical Med    furosemide (LASIX) 20 MG tablet Take 20 mg by mouth daily as needed. , Historical Med    lidocaine-prilocaine (EMLA) cream Apply 1 application topically as needed. Apply to port 1-2 hours (cover with wrap) before  chemotherapy appt, Starting Fri 02/14/2017, Normal    megestrol (MEGACE) 40 MG/ML suspension Take 5 mLs (200 mg total) by mouth daily., Starting Fri 03/07/2017, Normal    Sodium Chloride Flush (NORMAL SALINE FLUSH) 0.9 % SOLN use 10 milliliter SALINE FLUSHES AS DIRECTED, Normal      STOP taking these medications     enalapril (VASOTEC) 5 MG tablet         Today   VITAL SIGNS:  Blood pressure 111/63, pulse 88, temperature 98.5 F (36.9 C), resp. rate 18, height 5\' 10"  (1.778 m), weight 63.5 kg (140 lb), SpO2 92 %.  I/O:   Intake/Output Summary (Last 24 hours) at 03/12/17 1137 Last data filed at 03/11/17 1356  Gross per 24 hour  Intake              240 ml  Output              500 ml  Net             -  260 ml    PHYSICAL EXAMINATION:  Physical Exam  GENERAL:  83 y.o.-year-old patient lying in the bed with no acute distress.  LUNGS: Normal breath sounds bilaterally, no wheezing, rales,rhonchi or crepitation. No use of accessory muscles of respiration.  CARDIOVASCULAR: S1, S2 normal. No murmurs, rubs, or gallops.  ABDOMEN: Soft, non-tender, non-distended. Bowel sounds present. No organomegaly or mass.Bilateral nephrostomy tubes with clear urine  NEUROLOGIC: Moves all 4 extremities. PSYCHIATRIC: The patient is alert and oriented x 3.  SKIN: No obvious rash, lesion, or ulcer.   DATA REVIEW:   CBC  Recent Labs Lab 03/10/17 0807  WBC 14.6*  HGB 8.1*  HCT 25.9*  PLT 492*    Chemistries   Recent Labs Lab 03/07/17 0905  03/10/17 0807  NA 131*  < > 131*  K 4.2  < > 4.2  CL 98*  < > 99*  CO2 22  < > 25  GLUCOSE 127*  < > 101*  BUN 28*  < > 17  CREATININE 1.62*  < > 1.31*  CALCIUM 8.7*  < > 8.6*  AST 23  --   --   ALT 15*  --   --   ALKPHOS 155*  --   --   BILITOT 0.6  --   --   < > = values in this interval not displayed.  Cardiac Enzymes No results for input(s): TROPONINI in the last 168 hours.  Microbiology Results  Results for orders placed or performed  during the hospital encounter of 03/07/17  CULTURE, BLOOD (ROUTINE X 2) w Reflex to ID Panel     Status: None   Collection Time: 03/07/17  7:11 PM  Result Value Ref Range Status   Specimen Description BLOOD LEFT ANTECUBITAL  Final   Special Requests   Final    BOTTLES DRAWN AEROBIC AND ANAEROBIC Blood Culture results may not be optimal due to an excessive volume of blood received in culture bottles IMMUNOCOMPROMISED   Culture NO GROWTH 5 DAYS  Final   Report Status 03/12/2017 FINAL  Final  CULTURE, BLOOD (ROUTINE X 2) w Reflex to ID Panel     Status: None   Collection Time: 03/07/17  7:11 PM  Result Value Ref Range Status   Specimen Description BLOOD RIGHT ANTECUBITAL  Final   Special Requests   Final    BOTTLES DRAWN AEROBIC AND ANAEROBIC Blood Culture results may not be optimal due to an excessive volume of blood received in culture bottles IMMUNOCOMPROMISED   Culture NO GROWTH 5 DAYS  Final   Report Status 03/12/2017 FINAL  Final  Urine culture     Status: Abnormal (Preliminary result)   Collection Time: 03/07/17  8:14 PM  Result Value Ref Range Status   Specimen Description URINE, CATHETERIZED  Final   Special Requests RIGHT NEPHROSTOMY  Final   Culture (A)  Final    >=100,000 COLONIES/mL ENTEROBACTER SPECIES >=100,000 COLONIES/mL PROVIDENCIA RETTGERI REPEATING SENSITIVITIES    Report Status PENDING  Incomplete   Organism ID, Bacteria ENTEROBACTER SPECIES (A)  Final      Susceptibility   Enterobacter species - MIC*    CEFAZOLIN RESISTANT Resistant     CEFTRIAXONE <=1 SENSITIVE Sensitive     CIPROFLOXACIN <=0.25 SENSITIVE Sensitive     GENTAMICIN <=1 SENSITIVE Sensitive     IMIPENEM 0.5 SENSITIVE Sensitive     NITROFURANTOIN 128 RESISTANT Resistant     TRIMETH/SULFA <=20 SENSITIVE Sensitive     PIP/TAZO Value in next row Sensitive      <=  4 SENSITIVEPerformed at [facility], [place] [address] [place], [place] [address]    * >=100,000 COLONIES/mL ENTEROBACTER SPECIES   Urine culture     Status: Abnormal (Preliminary result)   Collection Time: 03/07/17  8:14 PM  Result Value Ref Range Status   Specimen Description URINE, CATHETERIZED  Final   Special Requests LEFT NEPHROSTOMY  Final   Culture (A)  Final    >=100,000 COLONIES/mL PROVIDENCIA RETTGERI >=100,000 COLONIES/mL ENTEROBACTER SPECIES SUSCEPTIBILITIES TO FOLLOW Performed at [facility], [place] [address] [place], [place] [address]    Report Status PENDING  Incomplete    RADIOLOGY:  No results found.  Follow up with PCP in 1 week.  Management plans discussed with the patient, family and they are in agreement.  CODE STATUS:  Code Status History    Date Active Date Inactive Code Status Order ID Comments User Context   03/07/2017  6:39 PM 03/11/2017  5:20 PM DNR [id number]  Loletha Grayer, MD Inpatient   12/22/2015  5:41 PM 01/01/2016  6:48 PM Full Code [id number]  Gladstone Lighter, MD Inpatient    Questions for Most Recent Historical Code Status (Order [id number])    Question Answer Comment   In the event of cardiac or respiratory ARREST Do not call a "code blue"    In the event of cardiac or respiratory ARREST Do not perform Intubation, CPR, defibrillation or ACLS    In the event of cardiac or respiratory ARREST Use medication by any route, position, wound care, and other measures to relive pain and suffering. May use oxygen, suction and manual treatment of airway obstruction as needed for comfort.    Comments nurse may pronounce         Advance Directive Documentation     Most Recent Value  Type of Advance Directive  Healthcare Power of Attorney, Living will  Pre-existing out of facility DNR order (yellow form or pink MOST form)  Physician notified to receive inpatient order  "MOST" Form in Place?  -      TOTAL TIME TAKING CARE OF THIS PATIENT ON DAY OF DISCHARGE: more than 30 minutes.   Hillary Bow R M.D on 03/12/2017 at 11:37 AM  Between 7am to 6pm - Pager -  [phone]  After 6pm go to [email or web address] - password [email or web address] [facility]  Office  [phone]  CC: Primary care physician; [name], MD  Note: This dictation was prepared with Dragon dictation along with smaller phrase technology. Any transcriptional errors that result from this process are unintentional.

## 2017-03-13 ENCOUNTER — Other Ambulatory Visit: Payer: Medicare Other

## 2017-03-13 ENCOUNTER — Ambulatory Visit: Payer: Medicare Other | Admitting: Cardiovascular Disease

## 2017-03-13 ENCOUNTER — Ambulatory Visit: Payer: Medicare Other | Admitting: Oncology

## 2017-03-13 LAB — URINE CULTURE
Culture: >=100000 — AB
Culture: >=100000 — AB

## 2017-03-18 ENCOUNTER — Inpatient Hospital Stay: Payer: Medicare Other | Attending: Oncology | Admitting: Oncology

## 2017-03-18 ENCOUNTER — Encounter: Payer: Self-pay | Admitting: Oncology

## 2017-03-18 ENCOUNTER — Inpatient Hospital Stay: Payer: Medicare Other

## 2017-03-18 VITALS — BP 77/46 | HR 66 | Temp 96.4°F | Resp 18

## 2017-03-18 DIAGNOSIS — I471 Supraventricular tachycardia: Secondary | ICD-10-CM | POA: Insufficient documentation

## 2017-03-18 DIAGNOSIS — K219 Gastro-esophageal reflux disease without esophagitis: Secondary | ICD-10-CM | POA: Diagnosis not present

## 2017-03-18 DIAGNOSIS — I129 Hypertensive chronic kidney disease with stage 1 through stage 4 chronic kidney disease, or unspecified chronic kidney disease: Secondary | ICD-10-CM | POA: Diagnosis not present

## 2017-03-18 DIAGNOSIS — N202 Calculus of kidney with calculus of ureter: Secondary | ICD-10-CM | POA: Diagnosis not present

## 2017-03-18 DIAGNOSIS — J449 Chronic obstructive pulmonary disease, unspecified: Secondary | ICD-10-CM | POA: Insufficient documentation

## 2017-03-18 DIAGNOSIS — I480 Paroxysmal atrial fibrillation: Secondary | ICD-10-CM | POA: Diagnosis not present

## 2017-03-18 DIAGNOSIS — C674 Malignant neoplasm of posterior wall of bladder: Secondary | ICD-10-CM | POA: Insufficient documentation

## 2017-03-18 DIAGNOSIS — M5136 Other intervertebral disc degeneration, lumbar region: Secondary | ICD-10-CM | POA: Insufficient documentation

## 2017-03-18 DIAGNOSIS — I340 Nonrheumatic mitral (valve) insufficiency: Secondary | ICD-10-CM | POA: Insufficient documentation

## 2017-03-18 DIAGNOSIS — Z79899 Other long term (current) drug therapy: Secondary | ICD-10-CM | POA: Diagnosis not present

## 2017-03-18 DIAGNOSIS — K8020 Calculus of gallbladder without cholecystitis without obstruction: Secondary | ICD-10-CM | POA: Diagnosis not present

## 2017-03-18 DIAGNOSIS — E538 Deficiency of other specified B group vitamins: Secondary | ICD-10-CM | POA: Diagnosis not present

## 2017-03-18 DIAGNOSIS — Z950 Presence of cardiac pacemaker: Secondary | ICD-10-CM | POA: Insufficient documentation

## 2017-03-18 DIAGNOSIS — N400 Enlarged prostate without lower urinary tract symptoms: Secondary | ICD-10-CM | POA: Diagnosis not present

## 2017-03-18 DIAGNOSIS — C799 Secondary malignant neoplasm of unspecified site: Secondary | ICD-10-CM

## 2017-03-18 DIAGNOSIS — Z953 Presence of xenogenic heart valve: Secondary | ICD-10-CM | POA: Diagnosis not present

## 2017-03-18 DIAGNOSIS — Z87442 Personal history of urinary calculi: Secondary | ICD-10-CM | POA: Diagnosis not present

## 2017-03-18 DIAGNOSIS — Z7189 Other specified counseling: Secondary | ICD-10-CM

## 2017-03-18 DIAGNOSIS — N183 Chronic kidney disease, stage 3 (moderate): Secondary | ICD-10-CM | POA: Diagnosis not present

## 2017-03-18 DIAGNOSIS — D631 Anemia in chronic kidney disease: Secondary | ICD-10-CM | POA: Diagnosis not present

## 2017-03-18 DIAGNOSIS — D519 Vitamin B12 deficiency anemia, unspecified: Secondary | ICD-10-CM | POA: Diagnosis not present

## 2017-03-18 DIAGNOSIS — C7800 Secondary malignant neoplasm of unspecified lung: Secondary | ICD-10-CM | POA: Diagnosis not present

## 2017-03-18 DIAGNOSIS — C779 Secondary and unspecified malignant neoplasm of lymph node, unspecified: Secondary | ICD-10-CM | POA: Diagnosis not present

## 2017-03-18 DIAGNOSIS — Z923 Personal history of irradiation: Secondary | ICD-10-CM | POA: Diagnosis not present

## 2017-03-18 DIAGNOSIS — E785 Hyperlipidemia, unspecified: Secondary | ICD-10-CM | POA: Diagnosis not present

## 2017-03-18 DIAGNOSIS — C679 Malignant neoplasm of bladder, unspecified: Secondary | ICD-10-CM

## 2017-03-18 DIAGNOSIS — I2510 Atherosclerotic heart disease of native coronary artery without angina pectoris: Secondary | ICD-10-CM | POA: Insufficient documentation

## 2017-03-18 LAB — CBC WITH DIFFERENTIAL/PLATELET
Basophils Absolute: 0 10*3/uL (ref 0–0.1)
Basophils Relative: 0 %
Eosinophils Absolute: 0 10*3/uL (ref 0–0.7)
Eosinophils Relative: 0 %
HCT: 27.4 % — ABNORMAL LOW (ref 40.0–52.0)
Hemoglobin: 8.9 g/dL — ABNORMAL LOW (ref 13.0–18.0)
Lymphocytes Relative: 5 %
Lymphs Abs: 0.9 10*3/uL — ABNORMAL LOW (ref 1.0–3.6)
MCH: 25.4 pg — ABNORMAL LOW (ref 26.0–34.0)
MCHC: 32.3 g/dL (ref 32.0–36.0)
MCV: 78.6 fL — ABNORMAL LOW (ref 80.0–100.0)
Monocytes Absolute: 0.9 10*3/uL (ref 0.2–1.0)
Monocytes Relative: 5 %
Neutro Abs: 15.9 10*3/uL — ABNORMAL HIGH (ref 1.4–6.5)
Neutrophils Relative %: 90 %
Platelets: 481 10*3/uL — ABNORMAL HIGH (ref 150–440)
RBC: 3.49 MIL/uL — ABNORMAL LOW (ref 4.40–5.90)
RDW: 18.6 % — ABNORMAL HIGH (ref 11.5–14.5)
WBC: 17.8 10*3/uL — ABNORMAL HIGH (ref 3.8–10.6)

## 2017-03-18 LAB — BASIC METABOLIC PANEL
Anion gap: 9 (ref 5–15)
BUN: 38 mg/dL — ABNORMAL HIGH (ref 6–20)
CO2: 22 mmol/L (ref 22–32)
Calcium: 8.6 mg/dL — ABNORMAL LOW (ref 8.9–10.3)
Chloride: 103 mmol/L (ref 101–111)
Creatinine, Ser: 1.55 mg/dL — ABNORMAL HIGH (ref 0.61–1.24)
GFR calc Af Amer: 44 mL/min — ABNORMAL LOW (ref >60)
GFR calc non Af Amer: 38 mL/min — ABNORMAL LOW (ref >60)
Glucose, Bld: 128 mg/dL — ABNORMAL HIGH (ref 65–99)
Potassium: 4.4 mmol/L (ref 3.5–5.1)
Sodium: 134 mmol/L — ABNORMAL LOW (ref 135–145)

## 2017-03-18 NOTE — Progress Notes (Signed)
Patient d/c from hospital last Tuesday.  Since that time patient has continued to not eat, have decreased BP and feels weak.  BP today 77/46 HR 66.  Unable to obtain pulse oximetry.

## 2017-03-18 NOTE — Progress Notes (Signed)
No treatment today per Dr. Janese Banks. LJ

## 2017-03-18 NOTE — Progress Notes (Signed)
Hematology/Oncology Consult note [facility]  [phone] [phone]  Patient Care Team: Kirk Ruths, MD as PCP - General (Internal Medicine)   Name of the patient: Austin Contreras  [id number]  1926-05-18   Date of visit: 03/18/17  Diagnosis- metastatic bladder carcinoma with lung metastases  Chief complaint/ Reason for visit- post hospital discharge f/u  Heme/Onc history: Patient is a 83 year old male with multiple comorbidities including coronary artery disease, COPD, BPH, A. fib and history of bladder cancer. He had initially presented with one-year history of hematuria starting in 2014 and underwent CT scan of the abdomen which shows abnormalities in the bladder wall. He had cystoscopy done which showed papillary bladder tumor involving the posterior wall as well as carcinoma in situ. 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 There was evidence of lymphovascular invasion as well as at least subepithelial connective tissue involvement. He also underwent radiation therapy but refused chemotherapy at that time. Currently he has bilateral nephrostomy tubes in place and follows up with urology regularly. Last CT scan from September 2017 showed small stable pulmonary nodules. Bladder wall thickening and calcification which could be treatment related versus residual/recurrent disease. Enlarged external iliac node which was new and suspicious for early nodal metastases. He has a repeat CT abdomen scheduled in March 2018.  Patient was last seen by me on 12/06/2016 and at that point did not want to pursue his bladder cancer further as he was not interested in getting any chemotherapy. I also discussed the possibility of immunotherapy but he did not desire any further workup at that time  Patient presented to the ER with some symptoms of pleuritic chest  pain and underwent CT chest which showed bilateral lung lesions consistent with metastatic disease.  CT abdomen from 02/12/2017 showed:Interval increase in size of right external iliac lymph nodes,the largest approximately 10 mm short axis since prior exam suspicious for nodal metastasis.  He also sees urology for bilateral nephrostomy tubes that were placed given scarring of his urinary bladder in the past and his kidney functions have actually improved.   Patient was seen by me on 02/04/2017 and plan was to proceed with palliative immunotherapy with [facility] as patient was not keen on pursuing chemotherapy  H&H from 02/04/2017 was 8.9/27.5 and anemia workup was consistent with anemia of chronic kidney disease as well as a component of iron deficiency and possible underlying malignancy. He received 1 dose of feraheme on 03/07/17  Patient decided to proceed with trial of immunotherapy as 1st line option for his bladder cancer  Patient does have chronic fatigue likely from malignancy and anemia. ACTH and cortisol on 03/08/17 was normal.    Interval history- he continues to feel poorly and is being barely able to walk from his bed up in the chair. He does not have much of an appetite. He does not report any pain today  ECOG PS- 3 Pain scale- 0 Opioid associated constipation- no  Review of systems- Review of Systems  Constitutional: Positive for malaise/fatigue and weight loss. Negative for chills and fever.       Lack of appetite  HENT: Negative for congestion, ear discharge and nosebleeds.   Eyes: Negative for blurred vision.  Respiratory: Negative for cough, hemoptysis, sputum production, shortness of breath and wheezing.   Cardiovascular: Negative for chest pain, palpitations, orthopnea and claudication.  Gastrointestinal: Negative for abdominal pain, blood in  stool, constipation, diarrhea, heartburn, melena, nausea and vomiting.  Genitourinary: Negative for dysuria, flank pain,  frequency, hematuria and urgency.  Musculoskeletal: Negative for back pain, joint pain and myalgias.  Skin: Negative for rash.  Neurological: Positive for weakness. Negative for dizziness, tingling, focal weakness, seizures and headaches.  Endo/Heme/Allergies: Does not bruise/bleed easily.  Psychiatric/Behavioral: Negative for depression and suicidal ideas. The patient does not have insomnia.     Allergies  Allergen Reactions  . Penicillins Rash and Other (See Comments)    Has patient had a PCN reaction causing immediate rash, facial/tongue/throat swelling, SOB or lightheadedness with hypotension: No Has patient had a PCN reaction causing severe rash involving mucus membranes or skin necrosis: No Has patient had a PCN reaction that required hospitalization No Has patient had a PCN reaction occurring within the last 10 years: No If all of the above answers are "NO", then may proceed with Cephalosporin use.  Marland Kitchen Flu Virus Vaccine Other (See Comments)    Times 3     Past Medical History:  Diagnosis Date  . B12 deficiency anemia 08/02/2015  . Bladder cancer ([place])    a. stage 2 s/p TUBTR;  b. s/p bilateral nephrostomy tubes.  Marland Kitchen BPH (benign prostatic hyperplasia)   . CKD (chronic kidney disease) stage 3, GFR 30-59 ml/min   . Coronary artery disease    a. 08/2012 s/p CABG x 2 (VG->RCA, VG->OM1) @ [facility];  b. H/o stress test some time after CABG (pt unsure of date) -> reportedly normal.  . GERD (gastroesophageal reflux disease)   . H/O aortic valve replacement with porcine valve    a. 08/2012 41mm Trifecta Biologic Valve.  Marland Kitchen History of GI bleed    a. ~ 2011 - required 6 units PRBC's.  . History of nephrolithiasis   . Hyperlipidemia   . Hypertension   . Mitral regurgitation   . PAF (paroxysmal atrial fibrillation) ([place])    a. Developed post-op AVR/CABG in 08/2012.  Initially treated with amio but did not tolerate.  On sotalol since late 2013.  No OAC despite CHA2DS2VASc = 4 secondary to h/o  GIB and chronic hematuria in the setting of bladder cancer s/p nephrostomy tubes.  . Peripheral neuropathy ([place])   . PONV (postoperative nausea and vomiting)   . Presence of permanent cardiac pacemaker    a. 08/2012 developed heart block/junctional bradycardia following CABG/AVR-->s/p MDT CRM Advisa DR MRI SureScan [id number] Dual Chamber PPM, ser # [id number].  . SVT (supraventricular tachycardia) ([place])      Past Surgical History:  Procedure Laterality Date  . AORTIC VALVE REPLACEMENT    . APPENDECTOMY  1942  . BACK SURGERY  1991  . CATARACT EXTRACTION, BILATERAL    . cataracts    . CORONARY ARTERY BYPASS GRAFT  08/2012  . CYSTOSCOPY WITH FULGERATION Bilateral 12/23/2015   Procedure: CYSTOSCOPY WITH FULGERATION;  Surgeon: Royston Cowper, MD;  Location: [facility] ORS;  Service: Urology;  Laterality: Bilateral;  . HAND SURGERY  1976   after trauma  . IR GENERIC HISTORICAL  09/06/2016   IR NEPHROSTOMY EXCHANGE LEFT 09/06/2016 Greggory Keen, MD [facility]  . IR GENERIC HISTORICAL  09/06/2016   IR NEPHROSTOMY EXCHANGE RIGHT 09/06/2016 Greggory Keen, MD [facility]  . IR GENERIC HISTORICAL  11/05/2016   IR NEPHROSTOMY TUBE CHANGE 11/05/2016 Aletta Edouard, MD [facility]  . IR GENERIC HISTORICAL  01/09/2017   IR NEPHROSTOMY EXCHANGE LEFT 01/09/2017 Arne Cleveland, MD [facility]  . IR GENERIC HISTORICAL  01/09/2017  IR NEPHROSTOMY EXCHANGE RIGHT 01/09/2017 Arne Cleveland, MD [facility]  . IR GENERIC HISTORICAL  01/16/2016   IR NEPHROSTOMY EXCHANGE RIGHT 01/16/2016 [facility] OUT REF  . IR GENERIC HISTORICAL  02/12/2017   IR FLUORO GUIDE PORT INSERTION RIGHT 02/12/2017 Jacqulynn Cadet, MD [facility]  . IR GENERIC HISTORICAL  02/21/2017   IR NEPHROSTOMY EXCHANGE RIGHT 02/21/2017 Markus Daft, MD [facility]  . IR GENERIC HISTORICAL  02/21/2017   IR NEPHROSTOMY EXCHANGE LEFT 02/21/2017 Markus Daft, MD [facility]  . PACEMAKER INSERTION    . PREPATELLAR BURSA EXCISION    . ROTATOR CUFF  REPAIR  1995   right shoulder  . TRANSURETHRAL RESECTION OF BLADDER TUMOR N/A 10/17/2015   Procedure: TRANSURETHRAL RESECTION OF BLADDER TUMOR (TURBT);  Surgeon: Royston Cowper, MD;  Location: [facility] ORS;  Service: Urology;  Laterality: N/A;  . TURP VAPORIZATION  2010    Social History   Social History  . Marital status: Married    Spouse name: N/A  . Number of children: N/A  . Years of education: N/A   Occupational History  . Not on file.   Social History Main Topics  . Smoking status: Former Smoker    Types: Cigarettes  . Smokeless tobacco: Former Systems developer     Comment: 1992  . Alcohol use No  . Drug use: No  . Sexual activity: Not on file   Other Topics Concern  . Not on file   Social History Narrative   Lives at home with wife, very independent. Active at baseline.  Retired from [employer]. 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    Family History  Problem Relation Age of Onset  . Lymphoma Sister   . Bone cancer Mother   . CAD Father   . CVA Father   . Prostate cancer Neg Hx   . Bladder Cancer Neg Hx   . Kidney cancer Neg Hx      Current Outpatient Prescriptions:  .  acetaminophen (TYLENOL) 500 MG tablet, Take 500 mg by mouth every 6 (six) hours as needed., Disp: , Rfl:  .  azelastine (ASTELIN) 0.1 % nasal spray, Place 2 sprays into both nostrils 2 (two) times daily. Use in each nostril as directed, Disp: , Rfl:  .  cholecalciferol (VITAMIN D) 1000 units tablet, Take 1,000 Units by mouth daily., Disp: , Rfl:  .  ciprofloxacin (CIPRO) 250 MG tablet, Take 1 tablet (250 mg total) by mouth 2 (two) times daily., Disp: 10 tablet, Rfl: 0 .  clindamycin (CLEOCIN) 150 MG capsule, Take 150 mg by mouth 3 (three) times daily as needed. , Disp: , Rfl:  .  cyanocobalamin (,VITAMIN B-12,) 1000 MCG/ML injection, Inject 1,000 mcg into the muscle every 30 (thirty) days., Disp: , Rfl:  .  feeding supplement, ENSURE ENLIVE, (ENSURE ENLIVE) LIQD, Take 237 mLs by mouth 3 (three) times  daily between meals., Disp: 60 Bottle, Rfl: 0 .  furosemide (LASIX) 20 MG tablet, Take 20 mg by mouth daily as needed. , Disp: , Rfl:  .  lidocaine-prilocaine (EMLA) cream, Apply 1 application topically as needed. Apply to port 1-2 hours (cover with wrap) before chemotherapy appt, Disp: 30 g, Rfl: 2 .  megestrol (MEGACE) 40 MG/ML suspension, Take 5 mLs (200 mg total) by mouth daily., Disp: 150 mL, Rfl: 0 .  simvastatin (ZOCOR) 10 MG tablet, Take 10 mg by mouth at bedtime. , Disp: , Rfl:  .  Sodium Chloride Flush (NORMAL SALINE FLUSH) 0.9 % SOLN, use 10  milliliter SALINE FLUSHES AS DIRECTED, Disp: 1200 mL, Rfl: 5 .  sotalol (BETAPACE) 80 MG tablet, Take 1 tablet (80 mg total) by mouth 2 (two) times daily., Disp: 60 tablet, Rfl: 0  Physical exam:  Vitals:   03/18/17 1045  BP: (!) 77/46  Pulse: 66  Resp: 18  Temp: (!) 96.4 F (35.8 C)  TempSrc: Tympanic   Physical Exam  Constitutional: He is oriented to person, place, and time.  Elderly frail gentleman sitting in a wheelchair. Appears fatigued  HENT:  Head: Normocephalic and atraumatic.  Eyes: EOM are normal. Pupils are equal, round, and reactive to light.  Cardiovascular: Normal rate, regular rhythm and normal heart sounds.   Pulmonary/Chest: Effort normal and breath sounds normal.  Abdominal: Soft. Bowel sounds are normal.  b/l neph tubes in place  Neurological: He is alert and oriented to person, place, and time.  Skin: Skin is warm and dry.     CMP Latest Ref Rng & Units 03/18/2017  Glucose 65 - 99 mg/dL 128(H)  BUN 6 - 20 mg/dL 38(H)  Creatinine 0.61 - 1.24 mg/dL 1.55(H)  Sodium 135 - 145 mmol/L 134(L)  Potassium 3.5 - 5.1 mmol/L 4.4  Chloride 101 - 111 mmol/L 103  CO2 22 - 32 mmol/L 22  Calcium 8.9 - 10.3 mg/dL 8.6(L)  Total Protein 6.5 - 8.1 g/dL -  Total Bilirubin 0.3 - 1.2 mg/dL -  Alkaline Phos 38 - 126 U/L -  AST 15 - 41 U/L -  ALT 17 - 63 U/L -   CBC Latest Ref Rng & Units 03/18/2017  WBC 3.8 - 10.6 K/uL 17.8(H)    Hemoglobin 13.0 - 18.0 g/dL 8.9(L)  Hematocrit 40.0 - 52.0 % 27.4(L)  Platelets 150 - 440 K/uL 481(H)    No images are attached to the encounter.  Dg Chest Port 1 View  Result Date: 03/07/2017 CLINICAL DATA:  Current history of metastatic lung cancer. Acute onset of dehydration and generalized weakness. Initial encounter. EXAM: PORTABLE CHEST 1 VIEW COMPARISON:  Chest radiograph and CT of the chest performed 01/31/2017 FINDINGS: There has been marked interval progression of diffuse metastatic disease throughout both lungs, with numerous masses of varying size seen bilaterally. No pleural effusion or pneumothorax is seen. No definite superimposed focal airspace consolidation is identified, though evaluation for airspace disease is limited given metastases. The patient's right-sided chest port is noted ending about the mid SVC. The cardiomediastinal silhouette remains normal in size. The patient is status post median sternotomy, with changes of prior CABG. A pacemaker is noted overlying the left chest wall, with leads ending overlying the right atrium and right ventricle. No acute osseous abnormalities are seen. IMPRESSION: Marked interval progression of diffuse metastatic disease throughout the lungs, with numerous masses of varying size seen bilaterally. Electronically Signed   By: Garald Balding M.D.   On: 03/07/2017 20:06   Ir Nephrostomy Exchange Left  Result Date: 02/21/2017 INDICATION: 83 year-old with metastatic bladder cancer. Patient presents for routine nephrostomy tube exchange. EXAM: EXCHANGE BILATERAL NEPHROSTOMY TUBES WITH FLUOROSCOPY Physician: Stephan Minister. Anselm Pancoast, MD COMPARISON:  None. MEDICATIONS: None ANESTHESIA/SEDATION: Fentanyl 25 mcg IV; Versed 1.0 mg IV Moderate Sedation Time:  12 minutes The patient was continuously monitored during the procedure by the interventional radiology nurse under my direct supervision. CONTRAST:  33mL ISOVUE-300 IOPAMIDOL (ISOVUE-300) INJECTION 61% -  administered into the collecting system(s) FLUOROSCOPY TIME:  Fluoroscopy Time: 48 seconds COMPLICATIONS: None immediate. PROCEDURE: The patient was placed prone and both flanks were prepped  and draped in a sterile fashion.  Maximal barrier sterile technique was utilized including caps, mask, sterile gowns, sterile gloves, sterile drape, hand hygiene and skin antiseptic.  Contrast was injected through both nephrostomy tubes. The right nephrostomy tube was cut and removed over an Amplatz wire. A new 10.2 [place] multipurpose drain was reconstituted in the renal pelvis. Contrast was injected to confirm placement in the renal pelvis. The left nephrostomy tube was cut and removed over a stiff Glidewire. New 10.2 [place] multipurpose drain was reconstituted in the left renal pelvis and contrast was injected to confirm placement in the renal pelvis. Skin was anesthetized with 1% lidocaine. Catheters were sutured to skin. Fluoroscopic images were taken and saved for this procedure. IMPRESSION: Successful exchange of bilateral nephrostomy tubes with fluoroscopy. Electronically Signed   By: Markus Daft M.D.   On: 02/21/2017 12:20   Ir Nephrostomy Exchange Right  Result Date: 02/21/2017 INDICATION: 83 year-old with metastatic bladder cancer. Patient presents for routine nephrostomy tube exchange. EXAM: EXCHANGE BILATERAL NEPHROSTOMY TUBES WITH FLUOROSCOPY Physician: Stephan Minister. Anselm Pancoast, MD COMPARISON:  None. MEDICATIONS: None ANESTHESIA/SEDATION: Fentanyl 25 mcg IV; Versed 1.0 mg IV Moderate Sedation Time:  12 minutes The patient was continuously monitored during the procedure by the interventional radiology nurse under my direct supervision. CONTRAST:  109mL ISOVUE-300 IOPAMIDOL (ISOVUE-300) INJECTION 61% - administered into the collecting system(s) FLUOROSCOPY TIME:  Fluoroscopy Time: 48 seconds COMPLICATIONS: None immediate. PROCEDURE: The patient was placed prone and both flanks were prepped and draped in a sterile fashion.   Maximal barrier sterile technique was utilized including caps, mask, sterile gowns, sterile gloves, sterile drape, hand hygiene and skin antiseptic.  Contrast was injected through both nephrostomy tubes. The right nephrostomy tube was cut and removed over an Amplatz wire. A new 10.2 [place] multipurpose drain was reconstituted in the renal pelvis. Contrast was injected to confirm placement in the renal pelvis. The left nephrostomy tube was cut and removed over a stiff Glidewire. New 10.2 [place] multipurpose drain was reconstituted in the left renal pelvis and contrast was injected to confirm placement in the renal pelvis. Skin was anesthetized with 1% lidocaine. Catheters were sutured to skin. Fluoroscopic images were taken and saved for this procedure. IMPRESSION: Successful exchange of bilateral nephrostomy tubes with fluoroscopy. Electronically Signed   By: Markus Daft M.D.   On: 02/21/2017 12:20     Assessment and plan- Patient is a 83 y.o. male with metastatic bladder cancer with lung metastases  Patient has received only 1 dose of keytruda so far. He could not receive second dose because of significant fatigue and received some IV fluids and ferriheme at that time. He was subsequently admitted to the hospital for urinary tract infection and fatigue. Overall patient's condition continues to decline and I did not think that he is in a position to receive any more treatment. I did discuss this with him and his daughters that his prognosis is poor from underlying bladder cancer which is likely the main cause of this fatigue. He is hypotensive today as well as mildly hyponatremic. He also has microcytic iron deficiency anemia. We could certainly give him IV fluids as well as a second dose of Feraheme which may make him feel transiently better.   However patient does not want to sit through any infusion today and would like to go home and feel comfortable. He does have home care services coming in on Friday  which are also linked up with hospice. I did discuss hospice philosophy and  they are agreeable for the same. I will put in hospice referral today. I would be happy to be his hospice physician to coordinate his care. With regards to his sotalol dosing,-his HR is well controlled and his fatigue has not improved despite going down on his dose. I have suggested that he should speak toDr. Ouida Sills or Dr. Fletcher Anon about sotalol dosing.   Visit Diagnosis 1. Goals of care, counseling/discussion   2. Metastasis from bladder cancer ([place])   3. Malignant neoplasm metastatic to lung, unspecified laterality [facility])      Dr. Randa Evens, MD, MPH [facility] at [facility] Pager- [phone] 03/18/2017 12:55 PM

## 2017-03-26 ENCOUNTER — Telehealth: Payer: Self-pay | Admitting: *Deleted

## 2017-03-27 ENCOUNTER — Ambulatory Visit: Payer: Medicare Other | Admitting: Internal Medicine

## 2017-03-28 ENCOUNTER — Other Ambulatory Visit: Payer: Self-pay | Admitting: Nurse Practitioner

## 2017-03-28 LAB — FUNGUS CULTURE, BLOOD: CULTURE: NO GROWTH

## 2017-03-28 NOTE — Progress Notes (Signed)
He went to hospice after the hospital and has passed away.

## 2017-04-07 ENCOUNTER — Ambulatory Visit: Payer: Medicare Other | Admitting: Physician Assistant

## 2017-04-15 NOTE — Telephone Encounter (Signed)
Called to report that patient expired @ 4:10 AM 04-14-2017

## 2017-04-15 DEATH — deceased

## 2017-04-21 NOTE — Addendum Note (Signed)
Encounter addended by: Kristin Bruins on: 04/21/2017 10:06 AM<BR>    Actions taken: Charge Capture section accepted

## 2017-09-30 IMAGING — DX DG CHEST 1V PORT
1 series · 1 of 1 positions shown · non-contrast
Comparison: Chest radiograph and CT of the chest performed
01/31/2017

CLINICAL DATA: Current history of metastatic lung cancer. Acute
onset of dehydration and generalized weakness. Initial encounter.

EXAM:
PORTABLE CHEST 1 VIEW

[chest ap]
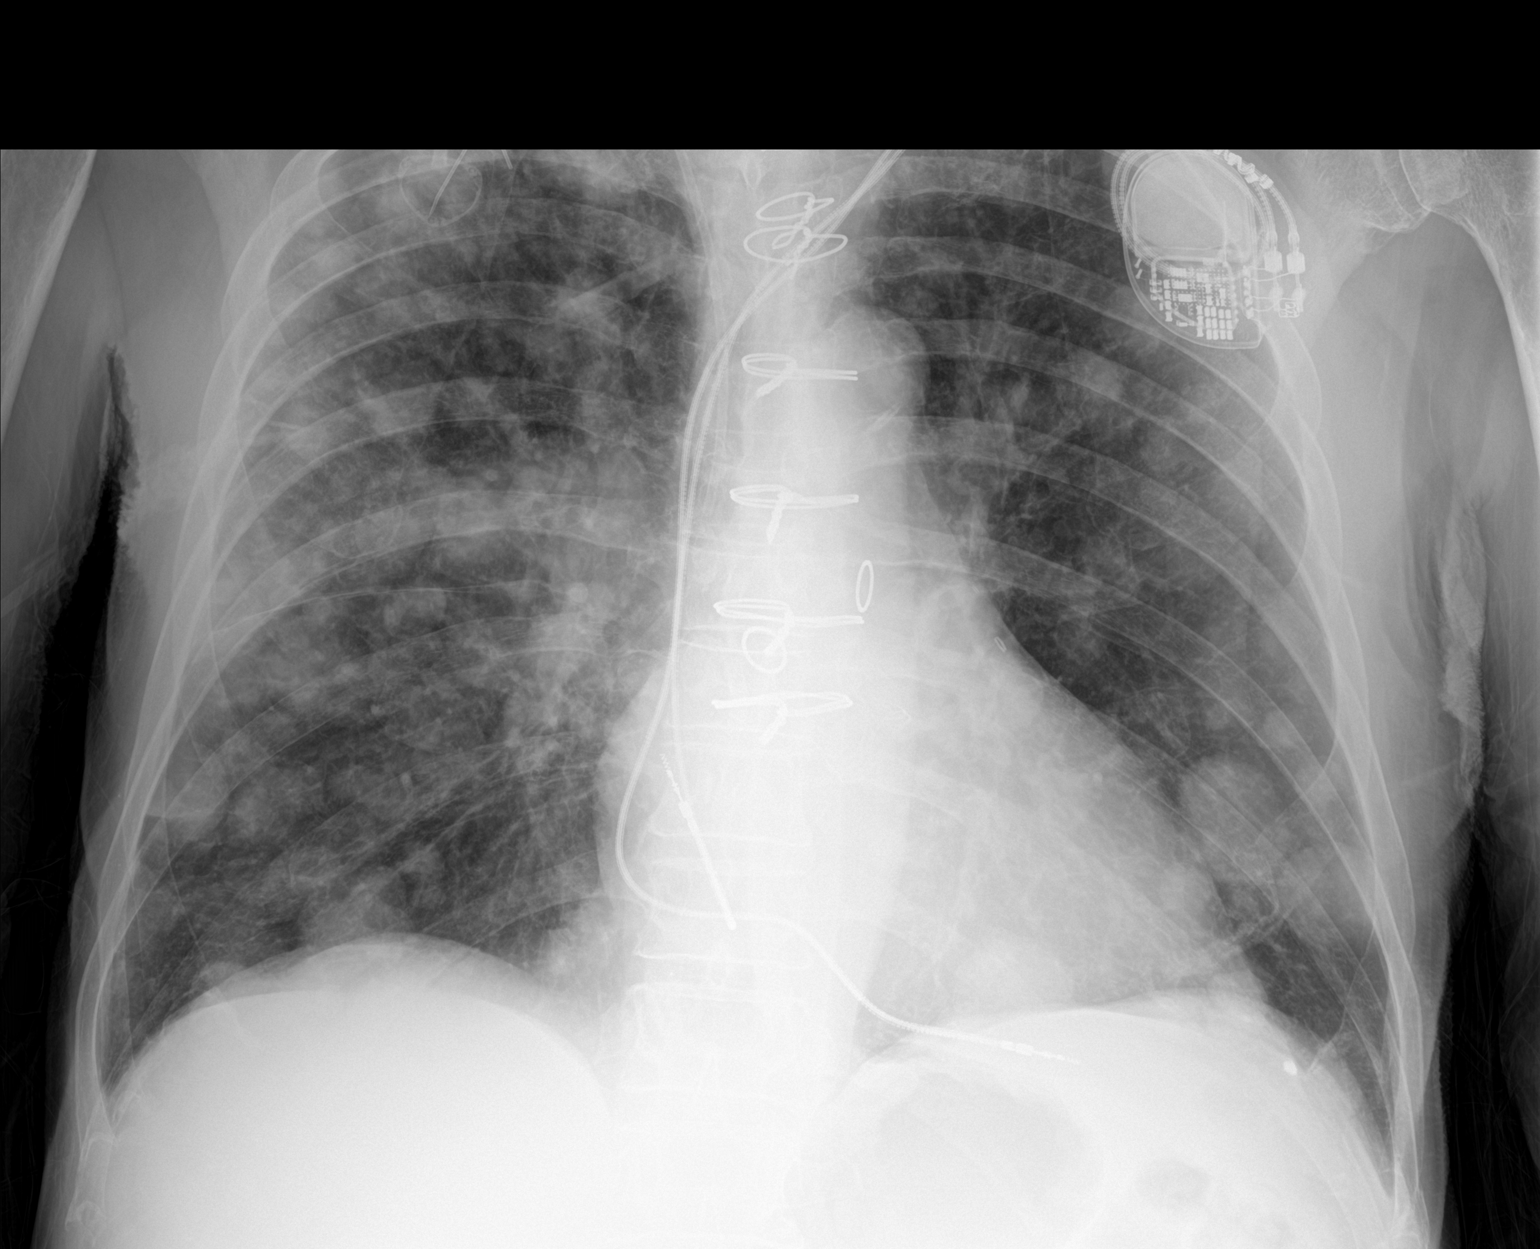

[1 of 1 positions shown; findings below may reference images not displayed]

FINDINGS: There has been marked interval progression of diffuse metastatic
disease throughout both lungs, with numerous masses of varying size
seen bilaterally. No pleural effusion or pneumothorax is seen. No
definite superimposed focal airspace consolidation is identified,
though evaluation for airspace disease is limited given metastases.

The patient's right-sided chest port is noted ending about the mid
SVC.

The cardiomediastinal silhouette remains normal in size. The patient
is status post median sternotomy, with changes of prior CABG. A
pacemaker is noted overlying the left chest wall, with leads ending
overlying the right atrium and right ventricle. No acute osseous
abnormalities are seen.
IMPRESSION: Marked interval progression of diffuse metastatic disease throughout
the lungs, with numerous masses of varying size seen bilaterally.

## 2017-11-28 ENCOUNTER — Other Ambulatory Visit: Payer: Medicare Other

## 2017-12-05 ENCOUNTER — Ambulatory Visit: Payer: Medicare Other | Admitting: Oncology
